# Patient Record
Sex: Female | Born: 1975 | Race: White | Hispanic: No | Marital: Married | Smoking: Former smoker
Health system: Southern US, Community
[De-identification: ages and names within clinical notes are randomized; demographics above are authoritative.]

## PROBLEM LIST (undated history)

## (undated) DIAGNOSIS — T7840XA Allergy, unspecified, initial encounter: Secondary | ICD-10-CM

## (undated) DIAGNOSIS — E669 Obesity, unspecified: Secondary | ICD-10-CM

## (undated) DIAGNOSIS — F419 Anxiety disorder, unspecified: Secondary | ICD-10-CM

## (undated) DIAGNOSIS — F329 Major depressive disorder, single episode, unspecified: Secondary | ICD-10-CM

## (undated) DIAGNOSIS — F32A Depression, unspecified: Secondary | ICD-10-CM

## (undated) DIAGNOSIS — K589 Irritable bowel syndrome without diarrhea: Secondary | ICD-10-CM

## (undated) DIAGNOSIS — M797 Fibromyalgia: Secondary | ICD-10-CM

## (undated) DIAGNOSIS — K219 Gastro-esophageal reflux disease without esophagitis: Secondary | ICD-10-CM

## (undated) DIAGNOSIS — E538 Deficiency of other specified B group vitamins: Secondary | ICD-10-CM

## (undated) HISTORY — DX: Fibromyalgia: M79.7

## (undated) HISTORY — PX: OTHER SURGICAL HISTORY: SHX169

## (undated) HISTORY — DX: Anxiety disorder, unspecified: F41.9

## (undated) HISTORY — DX: Deficiency of other specified B group vitamins: E53.8

## (undated) HISTORY — DX: Allergy, unspecified, initial encounter: T78.40XA

## (undated) HISTORY — DX: Irritable bowel syndrome without diarrhea: K58.9

## (undated) HISTORY — DX: Obesity, unspecified: E66.9

## (undated) HISTORY — DX: Major depressive disorder, single episode, unspecified: F32.9

## (undated) HISTORY — DX: Gastro-esophageal reflux disease without esophagitis: K21.9

## (undated) HISTORY — DX: Depression, unspecified: F32.A

---

## 1997-05-17 ENCOUNTER — Inpatient Hospital Stay (HOSPITAL_COMMUNITY): Admission: AD | Admit: 1997-05-17 | Discharge: 1997-05-17 | Payer: Self-pay | Admitting: *Deleted

## 1997-05-26 ENCOUNTER — Inpatient Hospital Stay (HOSPITAL_COMMUNITY): Admission: AD | Admit: 1997-05-26 | Discharge: 1997-05-26 | Payer: Self-pay | Admitting: *Deleted

## 1997-06-11 ENCOUNTER — Inpatient Hospital Stay (HOSPITAL_COMMUNITY): Admission: AD | Admit: 1997-06-11 | Discharge: 1997-06-13 | Payer: Self-pay | Admitting: Obstetrics

## 1998-02-22 ENCOUNTER — Inpatient Hospital Stay (HOSPITAL_COMMUNITY): Admission: AD | Admit: 1998-02-22 | Discharge: 1998-02-22 | Payer: Self-pay | Admitting: Obstetrics & Gynecology

## 1998-09-08 ENCOUNTER — Encounter: Payer: Self-pay | Admitting: Emergency Medicine

## 1998-09-08 ENCOUNTER — Emergency Department (HOSPITAL_COMMUNITY): Admission: EM | Admit: 1998-09-08 | Discharge: 1998-09-08 | Payer: Self-pay | Admitting: Emergency Medicine

## 2004-02-24 ENCOUNTER — Ambulatory Visit: Payer: Self-pay | Admitting: Internal Medicine

## 2004-06-08 ENCOUNTER — Ambulatory Visit: Payer: Self-pay | Admitting: Internal Medicine

## 2004-06-23 ENCOUNTER — Ambulatory Visit: Payer: Self-pay | Admitting: Internal Medicine

## 2004-06-30 ENCOUNTER — Ambulatory Visit: Payer: Self-pay | Admitting: Internal Medicine

## 2004-07-07 ENCOUNTER — Ambulatory Visit: Payer: Self-pay | Admitting: Internal Medicine

## 2004-07-14 ENCOUNTER — Ambulatory Visit: Payer: Self-pay | Admitting: Internal Medicine

## 2004-08-12 ENCOUNTER — Ambulatory Visit: Payer: Self-pay | Admitting: Internal Medicine

## 2004-09-12 ENCOUNTER — Ambulatory Visit: Payer: Self-pay | Admitting: Internal Medicine

## 2004-10-14 ENCOUNTER — Ambulatory Visit: Payer: Self-pay | Admitting: Internal Medicine

## 2004-11-11 ENCOUNTER — Ambulatory Visit: Payer: Self-pay | Admitting: Internal Medicine

## 2004-12-01 ENCOUNTER — Ambulatory Visit: Payer: Self-pay | Admitting: Internal Medicine

## 2004-12-08 ENCOUNTER — Emergency Department (HOSPITAL_COMMUNITY): Admission: EM | Admit: 2004-12-08 | Discharge: 2004-12-08 | Payer: Self-pay | Admitting: Emergency Medicine

## 2004-12-16 ENCOUNTER — Ambulatory Visit: Payer: Self-pay | Admitting: Internal Medicine

## 2004-12-28 ENCOUNTER — Ambulatory Visit: Payer: Self-pay | Admitting: Internal Medicine

## 2005-01-16 ENCOUNTER — Ambulatory Visit: Payer: Self-pay | Admitting: Internal Medicine

## 2005-02-13 ENCOUNTER — Ambulatory Visit: Payer: Self-pay | Admitting: Family Medicine

## 2005-03-16 ENCOUNTER — Ambulatory Visit: Payer: Self-pay | Admitting: Internal Medicine

## 2005-04-17 ENCOUNTER — Ambulatory Visit: Payer: Self-pay | Admitting: Internal Medicine

## 2005-04-20 ENCOUNTER — Other Ambulatory Visit: Admission: RE | Admit: 2005-04-20 | Discharge: 2005-04-20 | Payer: Self-pay | Admitting: Obstetrics and Gynecology

## 2005-05-16 ENCOUNTER — Ambulatory Visit: Payer: Self-pay | Admitting: Internal Medicine

## 2005-06-06 ENCOUNTER — Ambulatory Visit: Payer: Self-pay | Admitting: Internal Medicine

## 2005-06-15 ENCOUNTER — Ambulatory Visit: Payer: Self-pay | Admitting: Internal Medicine

## 2005-07-13 ENCOUNTER — Ambulatory Visit: Payer: Self-pay | Admitting: Internal Medicine

## 2005-07-27 ENCOUNTER — Ambulatory Visit: Payer: Self-pay | Admitting: Internal Medicine

## 2005-08-02 ENCOUNTER — Ambulatory Visit: Payer: Self-pay | Admitting: Internal Medicine

## 2005-08-15 ENCOUNTER — Ambulatory Visit: Payer: Self-pay | Admitting: Internal Medicine

## 2005-09-11 ENCOUNTER — Ambulatory Visit: Payer: Self-pay | Admitting: Internal Medicine

## 2005-10-04 ENCOUNTER — Ambulatory Visit: Payer: Self-pay | Admitting: Internal Medicine

## 2005-10-16 ENCOUNTER — Ambulatory Visit: Payer: Self-pay | Admitting: Internal Medicine

## 2005-11-14 ENCOUNTER — Ambulatory Visit: Payer: Self-pay | Admitting: Internal Medicine

## 2005-12-11 ENCOUNTER — Ambulatory Visit: Payer: Self-pay | Admitting: Internal Medicine

## 2005-12-19 ENCOUNTER — Ambulatory Visit: Payer: Self-pay | Admitting: Internal Medicine

## 2006-01-19 ENCOUNTER — Ambulatory Visit: Payer: Self-pay | Admitting: Internal Medicine

## 2006-02-16 ENCOUNTER — Ambulatory Visit: Payer: Self-pay | Admitting: Internal Medicine

## 2006-03-16 ENCOUNTER — Ambulatory Visit: Payer: Self-pay | Admitting: Internal Medicine

## 2006-04-10 ENCOUNTER — Ambulatory Visit: Payer: Self-pay | Admitting: Internal Medicine

## 2006-04-11 ENCOUNTER — Emergency Department (HOSPITAL_COMMUNITY): Admission: EM | Admit: 2006-04-11 | Discharge: 2006-04-11 | Payer: Self-pay | Admitting: Emergency Medicine

## 2006-04-13 ENCOUNTER — Ambulatory Visit: Payer: Self-pay | Admitting: Internal Medicine

## 2006-05-11 ENCOUNTER — Ambulatory Visit: Payer: Self-pay | Admitting: Internal Medicine

## 2006-06-11 ENCOUNTER — Ambulatory Visit: Payer: Self-pay | Admitting: Internal Medicine

## 2006-06-20 ENCOUNTER — Ambulatory Visit: Payer: Self-pay | Admitting: Family Medicine

## 2006-06-20 LAB — CONVERTED CEMR LAB
ALT: 14 units/L (ref 0–40)
Alkaline Phosphatase: 70 units/L (ref 39–117)
BUN: 12 mg/dL (ref 6–23)
Basophils Relative: 0.2 % (ref 0.0–1.0)
CO2: 28 meq/L (ref 19–32)
Creatinine, Ser: 0.7 mg/dL (ref 0.4–1.2)
Eosinophils Relative: 0.8 % (ref 0.0–5.0)
Glucose, Bld: 84 mg/dL (ref 70–99)
HCT: 43.7 % (ref 36.0–46.0)
Hemoglobin: 15 g/dL (ref 12.0–15.0)
Monocytes Absolute: 0.6 10*3/uL (ref 0.2–0.7)
Monocytes Relative: 7.8 % (ref 3.0–11.0)
Potassium: 4.1 meq/L (ref 3.5–5.1)
RDW: 12 % (ref 11.5–14.6)
TSH: 1.23 microintl units/mL (ref 0.35–5.50)
Total Bilirubin: 0.8 mg/dL (ref 0.3–1.2)
Total Protein: 7.1 g/dL (ref 6.0–8.3)
WBC: 7.5 10*3/uL (ref 4.5–10.5)

## 2006-07-13 ENCOUNTER — Ambulatory Visit: Payer: Self-pay | Admitting: Internal Medicine

## 2006-08-09 ENCOUNTER — Ambulatory Visit: Payer: Self-pay | Admitting: Internal Medicine

## 2006-09-06 ENCOUNTER — Ambulatory Visit: Payer: Self-pay | Admitting: Internal Medicine

## 2006-09-06 DIAGNOSIS — D531 Other megaloblastic anemias, not elsewhere classified: Secondary | ICD-10-CM | POA: Insufficient documentation

## 2006-09-06 DIAGNOSIS — D518 Other vitamin B12 deficiency anemias: Secondary | ICD-10-CM | POA: Insufficient documentation

## 2006-09-06 HISTORY — DX: Other vitamin B12 deficiency anemias: D51.8

## 2006-10-04 ENCOUNTER — Ambulatory Visit: Payer: Self-pay | Admitting: Internal Medicine

## 2006-11-05 ENCOUNTER — Ambulatory Visit: Payer: Self-pay | Admitting: Internal Medicine

## 2006-12-03 ENCOUNTER — Ambulatory Visit: Payer: Self-pay | Admitting: Internal Medicine

## 2007-01-01 ENCOUNTER — Ambulatory Visit: Payer: Self-pay | Admitting: Internal Medicine

## 2007-01-31 ENCOUNTER — Ambulatory Visit: Payer: Self-pay | Admitting: Internal Medicine

## 2007-02-05 ENCOUNTER — Inpatient Hospital Stay (HOSPITAL_COMMUNITY): Admission: AD | Admit: 2007-02-05 | Discharge: 2007-02-06 | Payer: Self-pay | Admitting: Obstetrics & Gynecology

## 2007-02-25 ENCOUNTER — Inpatient Hospital Stay (HOSPITAL_COMMUNITY): Admission: AD | Admit: 2007-02-25 | Discharge: 2007-02-28 | Payer: Self-pay | Admitting: Obstetrics and Gynecology

## 2007-03-05 ENCOUNTER — Ambulatory Visit: Payer: Self-pay | Admitting: Internal Medicine

## 2007-04-10 ENCOUNTER — Ambulatory Visit: Payer: Self-pay | Admitting: Internal Medicine

## 2007-05-09 ENCOUNTER — Ambulatory Visit: Payer: Self-pay | Admitting: Internal Medicine

## 2007-06-07 ENCOUNTER — Ambulatory Visit: Payer: Self-pay | Admitting: Internal Medicine

## 2007-07-08 ENCOUNTER — Ambulatory Visit: Payer: Self-pay | Admitting: Internal Medicine

## 2007-08-06 ENCOUNTER — Ambulatory Visit: Payer: Self-pay | Admitting: Internal Medicine

## 2007-08-22 ENCOUNTER — Encounter: Admission: RE | Admit: 2007-08-22 | Discharge: 2007-08-22 | Payer: Self-pay | Admitting: Internal Medicine

## 2007-08-22 ENCOUNTER — Ambulatory Visit: Payer: Self-pay | Admitting: Internal Medicine

## 2007-08-22 DIAGNOSIS — R109 Unspecified abdominal pain: Secondary | ICD-10-CM | POA: Insufficient documentation

## 2007-08-22 DIAGNOSIS — R3129 Other microscopic hematuria: Secondary | ICD-10-CM | POA: Insufficient documentation

## 2007-08-22 DIAGNOSIS — F411 Generalized anxiety disorder: Secondary | ICD-10-CM | POA: Insufficient documentation

## 2007-08-22 LAB — CONVERTED CEMR LAB
Ketones, urine, test strip: NEGATIVE
Nitrite: NEGATIVE
Urobilinogen, UA: 0.2

## 2007-08-23 ENCOUNTER — Telehealth (INDEPENDENT_AMBULATORY_CARE_PROVIDER_SITE_OTHER): Payer: Self-pay | Admitting: *Deleted

## 2007-09-11 ENCOUNTER — Ambulatory Visit: Payer: Self-pay | Admitting: Internal Medicine

## 2007-10-08 ENCOUNTER — Ambulatory Visit: Payer: Self-pay | Admitting: Internal Medicine

## 2007-11-05 ENCOUNTER — Ambulatory Visit: Payer: Self-pay | Admitting: Internal Medicine

## 2007-12-10 ENCOUNTER — Ambulatory Visit: Payer: Self-pay | Admitting: Internal Medicine

## 2007-12-10 DIAGNOSIS — G43909 Migraine, unspecified, not intractable, without status migrainosus: Secondary | ICD-10-CM | POA: Insufficient documentation

## 2007-12-10 DIAGNOSIS — IMO0001 Reserved for inherently not codable concepts without codable children: Secondary | ICD-10-CM | POA: Insufficient documentation

## 2008-01-09 ENCOUNTER — Ambulatory Visit: Payer: Self-pay | Admitting: Internal Medicine

## 2008-02-10 ENCOUNTER — Ambulatory Visit: Payer: Self-pay | Admitting: Internal Medicine

## 2008-02-26 ENCOUNTER — Ambulatory Visit: Payer: Self-pay | Admitting: Family Medicine

## 2008-02-26 DIAGNOSIS — M940 Chondrocostal junction syndrome [Tietze]: Secondary | ICD-10-CM | POA: Insufficient documentation

## 2008-03-12 ENCOUNTER — Ambulatory Visit: Payer: Self-pay | Admitting: Family Medicine

## 2008-03-12 ENCOUNTER — Telehealth (INDEPENDENT_AMBULATORY_CARE_PROVIDER_SITE_OTHER): Payer: Self-pay | Admitting: *Deleted

## 2008-04-09 ENCOUNTER — Ambulatory Visit: Payer: Self-pay | Admitting: Internal Medicine

## 2008-05-08 ENCOUNTER — Ambulatory Visit: Payer: Self-pay | Admitting: Internal Medicine

## 2008-06-05 ENCOUNTER — Ambulatory Visit: Payer: Self-pay | Admitting: Internal Medicine

## 2008-07-10 ENCOUNTER — Ambulatory Visit: Payer: Self-pay | Admitting: Internal Medicine

## 2008-08-06 ENCOUNTER — Ambulatory Visit: Payer: Self-pay | Admitting: Internal Medicine

## 2008-09-08 ENCOUNTER — Ambulatory Visit: Payer: Self-pay | Admitting: Internal Medicine

## 2008-10-08 ENCOUNTER — Ambulatory Visit: Payer: Self-pay | Admitting: Internal Medicine

## 2008-10-12 ENCOUNTER — Ambulatory Visit: Payer: Self-pay | Admitting: Family Medicine

## 2008-11-06 ENCOUNTER — Ambulatory Visit: Payer: Self-pay | Admitting: Internal Medicine

## 2008-12-08 ENCOUNTER — Ambulatory Visit: Payer: Self-pay | Admitting: Internal Medicine

## 2009-01-05 ENCOUNTER — Ambulatory Visit: Payer: Self-pay | Admitting: Family Medicine

## 2009-01-05 ENCOUNTER — Ambulatory Visit: Payer: Self-pay | Admitting: Diagnostic Radiology

## 2009-01-05 ENCOUNTER — Telehealth (INDEPENDENT_AMBULATORY_CARE_PROVIDER_SITE_OTHER): Payer: Self-pay | Admitting: *Deleted

## 2009-01-05 ENCOUNTER — Ambulatory Visit (HOSPITAL_BASED_OUTPATIENT_CLINIC_OR_DEPARTMENT_OTHER): Admission: RE | Admit: 2009-01-05 | Discharge: 2009-01-05 | Payer: Self-pay | Admitting: Family Medicine

## 2009-01-05 DIAGNOSIS — R079 Chest pain, unspecified: Secondary | ICD-10-CM | POA: Insufficient documentation

## 2009-01-06 ENCOUNTER — Telehealth (INDEPENDENT_AMBULATORY_CARE_PROVIDER_SITE_OTHER): Payer: Self-pay | Admitting: *Deleted

## 2009-02-09 ENCOUNTER — Ambulatory Visit: Payer: Self-pay | Admitting: Internal Medicine

## 2009-03-09 ENCOUNTER — Ambulatory Visit: Payer: Self-pay | Admitting: Internal Medicine

## 2009-04-09 ENCOUNTER — Ambulatory Visit: Payer: Self-pay | Admitting: Internal Medicine

## 2009-05-07 ENCOUNTER — Ambulatory Visit: Payer: Self-pay | Admitting: Internal Medicine

## 2009-06-08 ENCOUNTER — Ambulatory Visit: Payer: Self-pay | Admitting: Internal Medicine

## 2009-07-12 ENCOUNTER — Ambulatory Visit: Payer: Self-pay | Admitting: Internal Medicine

## 2009-07-29 ENCOUNTER — Ambulatory Visit: Payer: Self-pay | Admitting: Internal Medicine

## 2009-07-29 DIAGNOSIS — R110 Nausea: Secondary | ICD-10-CM | POA: Insufficient documentation

## 2009-07-29 DIAGNOSIS — R51 Headache: Secondary | ICD-10-CM

## 2009-07-29 DIAGNOSIS — H6990 Unspecified Eustachian tube disorder, unspecified ear: Secondary | ICD-10-CM | POA: Insufficient documentation

## 2009-07-29 DIAGNOSIS — R519 Headache, unspecified: Secondary | ICD-10-CM | POA: Insufficient documentation

## 2009-07-29 DIAGNOSIS — I951 Orthostatic hypotension: Secondary | ICD-10-CM | POA: Insufficient documentation

## 2009-07-29 DIAGNOSIS — H6980 Other specified disorders of Eustachian tube, unspecified ear: Secondary | ICD-10-CM | POA: Insufficient documentation

## 2009-07-30 LAB — CONVERTED CEMR LAB
Basophils Absolute: 0 10*3/uL (ref 0.0–0.1)
Calcium: 9.8 mg/dL (ref 8.4–10.5)
GFR calc non Af Amer: 91.29 mL/min (ref >60)
Glucose, Bld: 84 mg/dL (ref 70–99)
HCT: 41.7 % (ref 36.0–46.0)
Lymphs Abs: 2.6 10*3/uL (ref 0.7–4.0)
MCHC: 33.7 g/dL (ref 30.0–36.0)
MCV: 89.7 fL (ref 78.0–100.0)
Monocytes Absolute: 0.5 10*3/uL (ref 0.1–1.0)
Platelets: 298 10*3/uL (ref 150.0–400.0)
RDW: 13 % (ref 11.5–14.6)
Sodium: 141 meq/L (ref 135–145)

## 2009-09-10 ENCOUNTER — Ambulatory Visit: Payer: Self-pay | Admitting: Internal Medicine

## 2009-09-23 ENCOUNTER — Ambulatory Visit: Payer: Self-pay | Admitting: Internal Medicine

## 2009-10-07 ENCOUNTER — Ambulatory Visit: Payer: Self-pay | Admitting: Internal Medicine

## 2009-11-09 ENCOUNTER — Ambulatory Visit: Payer: Self-pay | Admitting: Internal Medicine

## 2009-12-08 ENCOUNTER — Ambulatory Visit: Payer: Self-pay | Admitting: Internal Medicine

## 2010-01-12 ENCOUNTER — Ambulatory Visit: Payer: Self-pay | Admitting: Internal Medicine

## 2010-02-14 ENCOUNTER — Ambulatory Visit: Payer: Self-pay | Admitting: Internal Medicine

## 2010-03-11 ENCOUNTER — Ambulatory Visit
Admission: RE | Admit: 2010-03-11 | Discharge: 2010-03-11 | Payer: Self-pay | Source: Home / Self Care | Attending: Internal Medicine | Admitting: Internal Medicine

## 2010-03-14 DIAGNOSIS — D518 Other vitamin B12 deficiency anemias: Secondary | ICD-10-CM

## 2010-04-12 NOTE — Assessment & Plan Note (Signed)
Summary: b-12 shot///sph  Nurse Visit  CC: 12 inj./kb   Allergies: 1)  ! Avelox 2)  ! Pcn 3)  ! Sumatriptan Succinate (Sumatriptan Succinate)  Medication Administration  Injection # 1:    Medication: Vit B12 1000 mcg    Diagnosis: ANEMIA, VITAMIN B12 DEFICIENCY NEC (ICD-281.1)    Route: IM    Site: L deltoid    Exp Date: 05/12/2011    Lot #: [id number]    Mfr: American Regent    Patient tolerated injection without complications    Given by: Lucious Groves CMA (January 12, 2010 11:24 AM)  Orders Added: 1)  Vit B12 1000 mcg [J3420] 2)  Admin of Therapeutic Inj  intramuscular or subcutaneous [16109]

## 2010-04-12 NOTE — Assessment & Plan Note (Signed)
Summary: B-12 shot/kdc  Nurse Visit   Allergies: 1)  ! Avelox 2)  ! Pcn  Medication Administration  Injection # 1:    Medication: Vit B12 1000 mcg    Diagnosis: ANEMIA, VITAMIN B12 DEFICIENCY NEC (ICD-281.1)    Route: IM    Site: R deltoid    Exp Date: 01/12/2011    Lot #: [id number]    Mfr: American Regent    Given by: Doristine Devoid (May 07, 2009 12:10 PM)  Orders Added: 1)  Vit B12 1000 mcg [J3420] 2)  Admin of Therapeutic Inj  intramuscular or subcutaneous [96372]   Medication Administration  Injection # 1:    Medication: Vit B12 1000 mcg    Diagnosis: ANEMIA, VITAMIN B12 DEFICIENCY NEC (ICD-281.1)    Route: IM    Site: R deltoid    Exp Date: 01/12/2011    Lot #: [id number]    Mfr: American Regent    Given by: Doristine Devoid (May 07, 2009 12:10 PM)  Orders Added: 1)  Vit B12 1000 mcg [J3420] 2)  Admin of Therapeutic Inj  intramuscular or subcutaneous [[id number]]

## 2010-04-12 NOTE — Assessment & Plan Note (Signed)
Summary: B12 INJ/RH.............  Nurse Visit   Allergies: 1)  ! Avelox 2)  ! Pcn 3)  ! Sumatriptan Succinate (Sumatriptan Succinate)  Medication Administration  Injection # 1:    Medication: Vit B12 1000 mcg    Diagnosis: ANEMIA, VITAMIN B12 DEFICIENCY NEC (ICD-281.1)    Route: IM    Site: L deltoid    Exp Date: 05/2011    Lot #: [id number]    Mfr: American Regent    Patient tolerated injection without complications    Given by: Shonna Chock CMA (December 08, 2009 11:47 AM)  Orders Added: 1)  Vit B12 1000 mcg [J3420] 2)  Admin of Therapeutic Inj  intramuscular or subcutaneous [16109]

## 2010-04-12 NOTE — Assessment & Plan Note (Signed)
Summary: b12 inj//pt will be here at 11:15//lch  Nurse Visit   Allergies: 1)  ! Avelox 2)  ! Pcn  Medication Administration  Injection # 1:    Medication: Vit B12 1000 mcg    Diagnosis: ANEMIA, VITAMIN B12 DEFICIENCY NEC (ICD-281.1)    Route: IM    Site: R deltoid    Exp Date: 04/2011    Lot #: [id number]    Mfr: American Regent    Patient tolerated injection without complications    Given by: Shonna Chock (Jul 12, 2009 11:23 AM)  Orders Added: 1)  Vit B12 1000 mcg [J3420] 2)  Admin of Therapeutic Inj  intramuscular or subcutaneous [[id number]]

## 2010-04-12 NOTE — Assessment & Plan Note (Signed)
Summary: HEADACHES--HAS BEEN KEEPING TRACK///SPH   Vital Signs:  Patient profile:   35 year old female Height:      64 inches (162.56 cm) Weight:      159.50 pounds (72.50 kg) BMI:     27.48 Temp:     98.4 degrees F (36.89 degrees C) oral Resp:     14 per minute BP sitting:   110 / 76  (left arm) Cuff size:   regular  Vitals Entered By: Lucious Groves CMA (September 23, 2009 12:26 PM) CC: C/O ongoing headache issues almost daily./kb Comments Patient notes that she does not experience fever or vomiting, but does get nausea. Patient also notes that she has seen an eye specialist./kb   Primary Care Provider:  Alwyn Ren  CC:  C/O ongoing headache issues almost daily./kb.  History of Present Illness: 07/29/2009 OV reviewed ; no change EXCEPT headaches now daily & no ear pressure, otherwise exactly same symptoms. They last 1-3 hrs ; usually present upon awakening.She denies excess caffeine or NSAIDS. Ophth exam was WNL since last visit.PMH of migraines; intolerant to triptans.Lupper face ;variable dull, sharp or throbbing.Rx: none  Current Medications (verified): 1)  Sertraline Hcl 50 Mg  Tabs (Sertraline Hcl) .Marland Kitchen.. 1 By Mouth Qd 2)  Gabapentin 100 Mg Caps (Gabapentin) .Marland Kitchen.. 1 Every 8 Hrs As Needed For Headache  Allergies (verified): 1)  ! Avelox 2)  ! Pcn 3)  ! Sumatriptan Succinate (Sumatriptan Succinate)  Review of Systems ENT:  Denies earache, nasal congestion, and sinus pressure; No purulence.  Physical Exam  General:  well-nourished,in no acute distress; alert,appropriate and cooperative throughout examination Eyes:  No corneal or conjunctival inflammation noted. EOMI. Perrla. Field of  Vision grossly normal. Mouth:  No tongue deviation Neurologic:  alert & oriented X3, cranial nerves II-XII intact, strength normal in all extremities, sensation intact to light touch, gait normal, DTRs symmetrical and normal, and Romberg negative.     Impression & Recommendations:  Problem # 1:   HEADACHE (ICD-784.0) Chronic Daily Headache Migraine Variant  Complete Medication List: 1)  Sertraline Hcl 50 Mg Tabs (Sertraline hcl) .Marland Kitchen.. 1 by mouth qd 2)  Gabapentin 100 Mg Caps (Gabapentin) .Marland Kitchen.. 1-3  every 8 hrs as needed for headache 3)  Topiramate 25 Mg Tabs (Topiramate) .Marland Kitchen.. 1 at bedtime x 7 days then 1 two times a day x 7 days. can increase to 2 two times a day if needed  Patient Instructions: 1)  Titrate generic Topamax as instructed. Prescriptions: TOPIRAMATE 25 MG TABS (TOPIRAMATE) 1 at bedtime X 7 days then 1 two times a day X 7 days. Can increase to 2 two times a day if needed  #60 x 0   Entered and Authorized by:   Marga Melnick MD   Signed by:   Marga Melnick MD on 09/23/2009   Method used:   Print then Give to Patient   RxID:   [id number] GABAPENTIN 100 MG CAPS (GABAPENTIN) 1-3  every 8 hrs as needed for headache  #60 x 1   Entered and Authorized by:   Marga Melnick MD   Signed by:   Marga Melnick MD on 09/23/2009   Method used:   Electronically to        [facility] [id number] (retail)       [address]       [place], [place]  [address]       Ph: [phone] or  [phone]       Fax: [phone]   RxID:   [id number]

## 2010-04-12 NOTE — Assessment & Plan Note (Signed)
Summary: B12/KN  Nurse Visit   Allergies: 1)  ! Avelox 2)  ! Pcn 3)  ! Sumatriptan Succinate (Sumatriptan Succinate)  Medication Administration  Injection # 1:    Medication: Vit B12 1000 mcg    Diagnosis: ANEMIA, VITAMIN B12 DEFICIENCY NEC (ICD-281.1)    Route: IM    Site: R deltoid    Exp Date: 05/2011    Lot #: [id number]    Mfr: American Regent    Patient tolerated injection without complications    Given by: Shonna Chock CMA (November 09, 2009 3:00 PM)  Orders Added: 1)  Vit B12 1000 mcg [J3420] 2)  Admin of Therapeutic Inj  intramuscular or subcutaneous [96372]  Appended Document: B12/KN

## 2010-04-12 NOTE — Assessment & Plan Note (Signed)
Summary: B12//KN  Nurse Visit   Allergies: 1)  ! Avelox 2)  ! Pcn  Medication Administration  Injection # 1:    Medication: Vit B12 1000 mcg    Diagnosis: ANEMIA, VITAMIN B12 DEFICIENCY NEC (ICD-281.1)    Route: IM    Site: R deltoid    Exp Date: 05/2011    Lot #: [id number]    Mfr: American Regent    Patient tolerated injection without complications    Given by: Army Fossa CMA (September 10, 2009 10:36 AM)  Orders Added: 1)  Admin of Therapeutic Inj  intramuscular or subcutaneous [96372] 2)  Vit B12 1000 mcg [J3420]

## 2010-04-12 NOTE — Assessment & Plan Note (Signed)
Summary: b12 shot/kdc  Nurse Visit   Allergies: 1)  ! Avelox 2)  ! Pcn  Medication Administration  Injection # 1:    Medication: Vit B12 1000 mcg    Diagnosis: ANEMIA, VITAMIN B12 DEFICIENCY NEC (ICD-281.1)    Route: IM    Site: L deltoid    Exp Date: 12/2010    Lot #: [id number]    Mfr: American Regent    Patient tolerated injection without complications    Given by: Floydene Flock (April 09, 2009 1:31 PM)  Orders Added: 1)  Admin of Therapeutic Inj  intramuscular or subcutaneous [96372] 2)  Vit B12 1000 mcg [J3420]   Medication Administration  Injection # 1:    Medication: Vit B12 1000 mcg    Diagnosis: ANEMIA, VITAMIN B12 DEFICIENCY NEC (ICD-281.1)    Route: IM    Site: L deltoid    Exp Date: 12/2010    Lot #: [id number]    Mfr: American Regent    Patient tolerated injection without complications    Given by: Floydene Flock (April 09, 2009 1:31 PM)  Orders Added: 1)  Admin of Therapeutic Inj  intramuscular or subcutaneous [96372] 2)  Vit B12 1000 mcg [J3420]

## 2010-04-12 NOTE — Assessment & Plan Note (Signed)
Summary: B12 INJ/RH...........  Nurse Visit   Allergies: 1)  ! Avelox 2)  ! Pcn  Medication Administration  Injection # 1:    Medication: Vit B12 1000 mcg    Diagnosis: ANEMIA, VITAMIN B12 DEFICIENCY NEC (ICD-281.1)    Route: IM    Site: R deltoid    Exp Date: 01/12/2011    Lot #: [id number]    Mfr: American Regent    Given by: Kandice Hams (June 08, 2009 11:52 AM)  Orders Added: 1)  Vit B12 1000 mcg [J3420] 2)  Admin of Therapeutic Inj  intramuscular or subcutaneous [[id number]]

## 2010-04-12 NOTE — Assessment & Plan Note (Signed)
Summary: b-12/cbs  Nurse Visit   Allergies: 1)  ! Avelox 2)  ! Pcn 3)  ! Sumatriptan Succinate (Sumatriptan Succinate)  Medication Administration  Injection # 1:    Medication: Vit B12 1000 mcg    Diagnosis: ANEMIA, VITAMIN B12 DEFICIENCY NEC (ICD-281.1)    Route: IM    Site: L deltoid    Exp Date: 05/2011    Lot #: [id number]    Mfr: American Regent    Patient tolerated injection without complications    Given by: Shonna Chock CMA (October 07, 2009 10:54 AM)  Orders Added: 1)  Vit B12 1000 mcg [J3420] 2)  Admin of Therapeutic Inj  intramuscular or subcutaneous [[id number]]

## 2010-04-12 NOTE — Assessment & Plan Note (Signed)
Summary: headache- dizzy- pressure in face/cbs   Vital Signs:  Patient profile:   35 year old female Weight:      162.0 pounds Temp:     98.7 degrees F oral Pulse rate:   70 / minute Resp:     16 per minute BP supine:   116 / 70  (left arm) BP sitting:   114 / 72  (left arm) BP standing:   116 / 72  (left arm) Cuff size:   large  Vitals Entered By: Shonna Chock (Jul 29, 2009 10:53 AM) CC: Headache, dizzy, ears feel full, pressure in face x 2 days, Headaches Comments REVIEWED MED LIST, PATIENT AGREED DOSE AND INSTRUCTION CORRECT  Pulse Supine: 66, Pulse Standing 80   Primary Care Provider:  Alwyn Ren  CC:  Headache, dizzy, ears feel full, pressure in face x 2 days, and Headaches.  History of Present Illness:  Headaches      This is a 35 year old woman who presents with Headaches since 07/26/2009 w/o trigger.  The patient reports nausea, sinus pain, and photophobia, but denies vomiting, sweats, tearing of eyes, nasal congestion, sinus pressure, and phonophobia.  The headache is described as constant,dull theadaches intermittently.  High-risk features (red flags) include vision  change  of blurring and new type of headache, the frontal headaches.  The patient denies the following high-risk features: fever, neck pain/stiffness, focal weakness, altered mental status, rash, trauma, and pain worse with exertion.  The headaches are precipitated by strenous activity.  Prior treatment has included a NSAID with "dulling" of pain. NSAIDS 2 every 4-6 hrs for 3 days. Caffeine as soda, 1 once daily .  intolerant to tryptans, nausea & lightheaded, palpitations  Allergies: 1)  ! Avelox 2)  ! Pcn  Review of Systems General:  Denies chills and sweats. Eyes:  Denies blurring, double vision, and vision loss-both eyes. ENT:  Complains of earache; denies decreased hearing, ear discharge, and ringing in ears; Pressure L ear. No purulence.No BPV. CV:  Complains of lightheadness and near fainting; denies  chest pain or discomfort and palpitations; Symptoms positionally ,ie standing suddenly. GI:  Denies bloody stools, dark tarry stools, and diarrhea. Neuro:  Denies brief paralysis, numbness, sensation of room spinning, tingling, and weakness.  Physical Exam  General:  in no acute distress; appropriate and cooperative throughout examination; appears mildly uncomfortable-appearing.   Head:  Normocephalic and atraumatic without obvious abnormalities. No apparent alopecia or balding. Eyes:  No corneal or conjunctival inflammation noted. EOMI. Perrla. Funduscopic exam benign, without hemorrhages, exudates or papilledema. Vision  & FOV grossly normal.Minimal unsustained nystagmus  with lm lat gaze Ears:  External ear exam shows no significant lesions or deformities.  Otoscopic examination reveals clear canals, tympanic membranes are intact bilaterally without bulging, retraction, inflammation or discharge. Hearing is grossly normal bilaterally. Whispe heard @ 6 ft bilaterally Mouth:  Oral mucosa and oropharynx without lesions or exudates.  Teeth in good repair. Neck:  No deformities, masses, or tenderness noted. Heart:  Normal rate and regular rhythm. S1 and S2 normal without gallop, murmur, click, rub or other extra sounds. Pulses:  R and L carotid,radial  pulses are full and equal bilaterally Extremities:  No clubbing, cyanosis, edema, or deformity noted with normal full range of motion of all joints.   Neg SLR Neurologic:  alert & oriented X3, cranial nerves II-XII intact, strength normal in all extremities, sensation intact to light touch, gait normal, DTRs symmetrical and normal, finger-to-nose normal, heel-to-shin normal, toes down  bilaterally on Babinski, and Romberg negative.   Skin:  Intact without suspicious lesions or rashes Cervical Nodes:  No lymphadenopathy noted Axillary Nodes:  No palpable lymphadenopathy Psych:  memory intact for recent and remote, normally interactive, good eye contact,  and not anxious appearing.     Impression & Recommendations:  Problem # 1:  HEADACHE (ICD-784.0)  The following medications were removed from the medication list:    Vicodin Es 7.5-750 Mg Tabs (Hydrocodone-acetaminophen) .Marland Kitchen... 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 1 by mouth qd 2)  Gabapentin 100 Mg Caps (Gabapentin) .Marland Kitchen.. 1 every 8 hrs as needed for headache  Patient Instructions: 1)  Interventions for Eustachian Tube Dysfunction as discussed. sometrics pre standing . Prescriptions: GABAPENTIN 100 MG CAPS (GABAPENTIN) 1 every 8 hrs as needed for headache  #30 x 0   Entered and Authorized by:   Marga Melnick MD   Signed by:   Marga Melnick MD on 07/29/2009   Method used:   Faxed to ...       [facility] [address] (retail)       [address]       [place], [place]  [address]       Ph: [phone] or [phone]       Fax: [phone]   RxID:   [id number]   Appended Document: headache- dizzy- pressure in face/cbs   Medication Administration  Injection # 1:    Medication: Vit B12 1000 mcg    Diagnosis: ANEMIA, VITAMIN B12 DEFICIENCY NEC (ICD-281.1)    Route: IM    Site: R deltoid    Exp Date: 04/13    Lot #: [id number]    Mfr: American Regent    Patient tolerated injection without complications    Given by: Shonna Chock (Jul 29, 2009 12:24 PM)  Orders Added: 1)  Vit B12 1000 mcg [J3420] 2)  Admin of Therapeutic Inj  intramuscular or subcutaneous [56387]

## 2010-04-12 NOTE — Assessment & Plan Note (Signed)
Summary: FOR B-12//PH  Nurse Visit  CC: B-12 inj./kb   Allergies: 1)  ! Avelox 2)  ! Pcn 3)  ! Sumatriptan Succinate (Sumatriptan Succinate)  Medication Administration  Injection # 1:    Medication: Vit B12 1000 mcg    Diagnosis: ANEMIA, VITAMIN B12 DEFICIENCY NEC (ICD-281.1)    Route: IM    Site: L deltoid    Exp Date: 05/12/2011    Lot #: [id number]    Mfr: American Regent    Patient tolerated injection without complications    Given by: Lucious Groves CMA (February 14, 2010 2:23 PM)  Orders Added: 1)  Vit B12 1000 mcg [J3420] 2)  Admin of Therapeutic Inj  intramuscular or subcutaneous [[id number]]

## 2010-04-14 ENCOUNTER — Encounter: Payer: Self-pay | Admitting: Internal Medicine

## 2010-04-14 ENCOUNTER — Ambulatory Visit (INDEPENDENT_AMBULATORY_CARE_PROVIDER_SITE_OTHER): Payer: BC Managed Care – PPO

## 2010-04-14 DIAGNOSIS — D518 Other vitamin B12 deficiency anemias: Secondary | ICD-10-CM

## 2010-04-14 NOTE — Assessment & Plan Note (Signed)
Summary: B-12//PH  Nurse Visit   Allergies: 1)  ! Avelox 2)  ! Pcn 3)  ! Sumatriptan Succinate (Sumatriptan Succinate)  Medication Administration  Injection # 1:    Medication: Vit B12 1000 mcg    Diagnosis: ANEMIA, VITAMIN B12 DEFICIENCY NEC (ICD-281.1)    Route: IM    Site: R deltoid    Exp Date: 05/12/2011    Lot #: [id number]    Mfr: American Regent    Patient tolerated injection without complications    Given by: Jeremy Johann CMA (March 11, 2010 10:08 AM)  Orders Added: 1)  Admin of Therapeutic Inj  intramuscular or subcutaneous [96372] 2)  Vit B12 1000 mcg [J3420]

## 2010-04-20 ENCOUNTER — Other Ambulatory Visit: Payer: Self-pay | Admitting: Obstetrics and Gynecology

## 2010-04-20 DIAGNOSIS — N644 Mastodynia: Secondary | ICD-10-CM

## 2010-04-20 NOTE — Assessment & Plan Note (Signed)
Summary: B12 Injection/scm  Nurse Visit   Allergies: 1)  ! Avelox 2)  ! Pcn 3)  ! Sumatriptan Succinate (Sumatriptan Succinate)  Medication Administration  Injection # 1:    Medication: Vit B12 1000 mcg    Diagnosis: ANEMIA, VITAMIN B12 DEFICIENCY NEC (ICD-281.1)    Route: IM    Site: R deltoid    Exp Date: 05/2011    Lot #: [id number]    Mfr: American Regent    Patient tolerated injection without complications    Given by: Shonna Chock CMA (April 14, 2010 3:03 PM)  Orders Added: 1)  Vit B12 1000 mcg [J3420] 2)  Admin of Therapeutic Inj  intramuscular or subcutaneous [96372]  Patient aware at next b12 injection she is to have b12 level checked 1st, then injection to be given./[facility] CMA  April 14, 2010 3:04 PM

## 2010-04-26 ENCOUNTER — Other Ambulatory Visit: Payer: BC Managed Care – PPO

## 2010-04-28 ENCOUNTER — Ambulatory Visit
Admission: RE | Admit: 2010-04-28 | Discharge: 2010-04-28 | Disposition: A | Discharge status: Home / Self Care | Payer: BC Managed Care – PPO | Source: Ambulatory Visit | Attending: Obstetrics and Gynecology | Admitting: Obstetrics and Gynecology

## 2010-04-28 DIAGNOSIS — N644 Mastodynia: Secondary | ICD-10-CM

## 2010-05-13 ENCOUNTER — Other Ambulatory Visit: Payer: Self-pay | Admitting: Internal Medicine

## 2010-05-13 ENCOUNTER — Encounter: Payer: Self-pay | Admitting: Internal Medicine

## 2010-05-13 ENCOUNTER — Ambulatory Visit: Payer: BC Managed Care – PPO

## 2010-05-13 ENCOUNTER — Ambulatory Visit (INDEPENDENT_AMBULATORY_CARE_PROVIDER_SITE_OTHER): Payer: BC Managed Care – PPO

## 2010-05-13 DIAGNOSIS — D518 Other vitamin B12 deficiency anemias: Secondary | ICD-10-CM

## 2010-05-19 NOTE — Assessment & Plan Note (Signed)
Summary: b12/cbs  Nurse Visit   Allergies: 1)  ! Avelox 2)  ! Pcn 3)  ! Sumatriptan Succinate (Sumatriptan Succinate)  Medication Administration  Injection # 1:    Medication: Vit B12 1000 mcg    Diagnosis: ANEMIA, VITAMIN B12 DEFICIENCY NEC (ICD-281.1)    Route: IM    Site: L deltoid    Exp Date: 05/2011    Lot #: [id number]    Mfr: American Regent    Patient tolerated injection without complications    Given by: Shonna Chock CMA (May 13, 2010 2:07 PM)  Orders Added: 1)  TLB-B12, Serum-Total ONLY [82607-B12] 2)  Vit B12 1000 mcg [J3420] 3)  Admin of Therapeutic Inj  intramuscular or subcutaneous [34742]

## 2010-05-24 ENCOUNTER — Encounter: Payer: Self-pay | Admitting: Internal Medicine

## 2010-06-10 ENCOUNTER — Ambulatory Visit (INDEPENDENT_AMBULATORY_CARE_PROVIDER_SITE_OTHER): Payer: BC Managed Care – PPO | Admitting: *Deleted

## 2010-06-10 DIAGNOSIS — E538 Deficiency of other specified B group vitamins: Secondary | ICD-10-CM

## 2010-06-10 MED ORDER — CYANOCOBALAMIN 1000 MCG/ML IJ SOLN
1000.0000 ug | Freq: Once | INTRAMUSCULAR | Status: AC
  Administered 2010-06-10: 1000 ug via INTRAMUSCULAR

## 2010-07-11 ENCOUNTER — Ambulatory Visit (INDEPENDENT_AMBULATORY_CARE_PROVIDER_SITE_OTHER): Payer: BC Managed Care – PPO | Admitting: *Deleted

## 2010-07-11 DIAGNOSIS — E538 Deficiency of other specified B group vitamins: Secondary | ICD-10-CM

## 2010-07-11 MED ORDER — CYANOCOBALAMIN 1000 MCG/ML IJ SOLN
1000.0000 ug | Freq: Once | INTRAMUSCULAR | Status: AC
  Administered 2010-07-11: 1000 ug via INTRAMUSCULAR

## 2010-08-10 ENCOUNTER — Ambulatory Visit (INDEPENDENT_AMBULATORY_CARE_PROVIDER_SITE_OTHER): Payer: BC Managed Care – PPO | Admitting: *Deleted

## 2010-08-10 DIAGNOSIS — E538 Deficiency of other specified B group vitamins: Secondary | ICD-10-CM

## 2010-08-10 MED ORDER — CYANOCOBALAMIN 1000 MCG/ML IJ SOLN
1000.0000 ug | Freq: Once | INTRAMUSCULAR | Status: AC
  Administered 2010-08-10: 1000 ug via INTRAMUSCULAR

## 2010-09-08 ENCOUNTER — Ambulatory Visit (INDEPENDENT_AMBULATORY_CARE_PROVIDER_SITE_OTHER): Payer: BC Managed Care – PPO | Admitting: *Deleted

## 2010-09-08 DIAGNOSIS — E538 Deficiency of other specified B group vitamins: Secondary | ICD-10-CM

## 2010-09-08 MED ORDER — CYANOCOBALAMIN 1000 MCG/ML IJ SOLN
1000.0000 ug | Freq: Once | INTRAMUSCULAR | Status: AC
  Administered 2010-09-08: 1000 ug via INTRAMUSCULAR

## 2010-10-11 ENCOUNTER — Ambulatory Visit (INDEPENDENT_AMBULATORY_CARE_PROVIDER_SITE_OTHER): Payer: BC Managed Care – PPO

## 2010-10-11 DIAGNOSIS — D518 Other vitamin B12 deficiency anemias: Secondary | ICD-10-CM

## 2010-10-11 DIAGNOSIS — D519 Vitamin B12 deficiency anemia, unspecified: Secondary | ICD-10-CM

## 2010-10-11 MED ORDER — CYANOCOBALAMIN 1000 MCG/ML IJ SOLN
1000.0000 ug | Freq: Once | INTRAMUSCULAR | Status: AC
  Administered 2010-10-11: 1000 ug via INTRAMUSCULAR

## 2010-11-09 ENCOUNTER — Ambulatory Visit (INDEPENDENT_AMBULATORY_CARE_PROVIDER_SITE_OTHER): Payer: BC Managed Care – PPO

## 2010-11-09 DIAGNOSIS — D518 Other vitamin B12 deficiency anemias: Secondary | ICD-10-CM

## 2010-11-09 MED ORDER — CYANOCOBALAMIN 1000 MCG/ML IJ SOLN
1000.0000 ug | Freq: Once | INTRAMUSCULAR | Status: AC
  Administered 2010-11-09: 1000 ug via INTRAMUSCULAR

## 2010-12-09 ENCOUNTER — Ambulatory Visit (INDEPENDENT_AMBULATORY_CARE_PROVIDER_SITE_OTHER): Payer: BC Managed Care – PPO

## 2010-12-09 DIAGNOSIS — D519 Vitamin B12 deficiency anemia, unspecified: Secondary | ICD-10-CM

## 2010-12-09 DIAGNOSIS — D518 Other vitamin B12 deficiency anemias: Secondary | ICD-10-CM

## 2010-12-09 MED ORDER — CYANOCOBALAMIN 1000 MCG/ML IJ SOLN
1000.0000 ug | Freq: Once | INTRAMUSCULAR | Status: AC
  Administered 2010-12-09: 1000 ug via INTRAMUSCULAR

## 2010-12-16 LAB — CBC
HCT: 30.9 — ABNORMAL LOW
MCHC: 34.4
Platelets: 194
RBC: 4.07
WBC: 10.3
WBC: 9.1

## 2010-12-16 LAB — RPR: RPR Ser Ql: NONREACTIVE

## 2011-01-06 ENCOUNTER — Ambulatory Visit (INDEPENDENT_AMBULATORY_CARE_PROVIDER_SITE_OTHER): Payer: BC Managed Care – PPO

## 2011-01-06 DIAGNOSIS — D518 Other vitamin B12 deficiency anemias: Secondary | ICD-10-CM

## 2011-01-06 DIAGNOSIS — D519 Vitamin B12 deficiency anemia, unspecified: Secondary | ICD-10-CM

## 2011-01-06 MED ORDER — CYANOCOBALAMIN 1000 MCG/ML IJ SOLN
1000.0000 ug | Freq: Once | INTRAMUSCULAR | Status: AC
  Administered 2011-01-06: 1000 ug via INTRAMUSCULAR

## 2011-01-12 ENCOUNTER — Encounter: Payer: Self-pay | Admitting: Internal Medicine

## 2011-01-13 ENCOUNTER — Ambulatory Visit (INDEPENDENT_AMBULATORY_CARE_PROVIDER_SITE_OTHER): Payer: BC Managed Care – PPO | Admitting: Internal Medicine

## 2011-01-13 ENCOUNTER — Encounter: Payer: Self-pay | Admitting: Internal Medicine

## 2011-01-13 DIAGNOSIS — F419 Anxiety disorder, unspecified: Secondary | ICD-10-CM

## 2011-01-13 DIAGNOSIS — F411 Generalized anxiety disorder: Secondary | ICD-10-CM

## 2011-01-13 DIAGNOSIS — G43909 Migraine, unspecified, not intractable, without status migrainosus: Secondary | ICD-10-CM

## 2011-01-13 MED ORDER — LORAZEPAM 0.5 MG PO TABS: 0.5000 mg | ORAL_TABLET | ORAL | Status: AC

## 2011-01-13 MED ORDER — GABAPENTIN 100 MG PO CAPS: 100.0000 mg | ORAL_CAPSULE | ORAL | Status: DC

## 2011-01-13 NOTE — Patient Instructions (Signed)

## 2011-01-13 NOTE — Progress Notes (Signed)
  Subjective:    Patient ID: Jaclyn Tucker, female    DOB: 1975-07-24, 35 y.o.   MRN: [id number]  HPI Stress: Triggers: work & 3 deaths in family, both grandfather & her husband's grandmother Onset:June Anxiety:this is main issue Depression:no Loss of interest (Anhedonia):no Panic attacks: isolated panic attacks in past 12 months X 3 Insomnia:no Anorexia:no Fatigue:no Neurologic signs/symptoms:intermittent Migraines; no  numbness , tingling, or  weakness Endocrinologic signs and symptoms: no hoarseness, weight change, vision change, temperature intolerance, bowel changes, skin/hair,/nail changes Family history of mental health issues, alcoholism or drug abuse: Anxiety & depression  In both sides of family Medications/efficacy:no meds, previously on Sertraline. She was very emotional on this; ? worsened anxiety. " I feel more human when I didn't take it"        Review of Systems     Objective:   Physical Exam  Gen.:  well-nourished; in no acute distress Eyes: Extraocular motion intact; no lid lag or proptosis Neck: full ROM ; thyroid normal Heart: Normal rhythm and rate without significant murmur, gallop, or extra heart sounds Lungs: Chest clear to auscultation without rales,rales, wheezes Neuro:Deep tendon reflexes are equal and within normal limits; no tremor  Skin: Warm and dry without significant lesions or rashes; no onycholysis Psych: Normally communicative and interactive; no abnormal mood or affect clinically.   MDQ: single   + answer; "no problem".           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 She felt that the symptoms were worse on sertraline. She generally states does not want to take the medication daily. The pathophysiology of serotonin deficiency was discussed and diagrammed. Her mood disorder questionnaire was essentially totally negative except for a single positive answer.  Based on this  history I recommended a trial of Cymbalta but she did not want to take this medicine. Her mother is on Xanax; I mentioned the high risk of addiction/ habituation with this medicine. I will give her a trial of low-dose lorazepam to be taken.  I encouraged her to review the discussion on Neurotransmitter deficiency.

## 2011-01-23 ENCOUNTER — Telehealth: Payer: Self-pay | Admitting: Internal Medicine

## 2011-01-23 NOTE — Telephone Encounter (Signed)
Spoke with patient and she said that she was really just looking for somebody else to hear her. She says that if another MD has reviewed the note and agrees with Alfonse Flavors that is fine. She will come and see him Wednesday again to discuss.

## 2011-01-23 NOTE — Telephone Encounter (Signed)
Message copied by Ellwood Dense on Mon Jan 23, 2011  1:02 PM ------      Message from: Sheliah Hatch      Created: Mon Jan 23, 2011 12:52 PM       i reviewed Dr Frederik Pear note from 11/2 on her anxiety and agree w/ his recommendations.  Since she didn't like his answer, she probably won't like mine.  i would be happy to see her and tell her i agree w/ him but if she's looking for a different answer she will want to see someone else.            kt      ----- Message -----         From: Ellwood Dense         Sent: 01/23/2011  12:48 PM           To: Neena Rhymes, MD            Pt is a pt of Dr. Alwyn Ren and has seen him for anxiety. She is wanting to get another opinion since her and Hopp don't seem to agree on her condition. Is this something that we do here within the physicians? If so she was wanting to see you for the second opinion.

## 2011-01-24 ENCOUNTER — Ambulatory Visit (INDEPENDENT_AMBULATORY_CARE_PROVIDER_SITE_OTHER): Payer: BC Managed Care – PPO | Admitting: Internal Medicine

## 2011-01-24 VITALS — BP 122/78 | HR 85 | Temp 98.4°F | Ht 64.0 in | Wt 157.0 lb

## 2011-01-24 DIAGNOSIS — F411 Generalized anxiety disorder: Secondary | ICD-10-CM

## 2011-01-24 MED ORDER — SERTRALINE HCL 50 MG PO TABS: ORAL_TABLET | ORAL | Status: DC

## 2011-01-24 NOTE — Progress Notes (Signed)
  Subjective:    Patient ID: Jaclyn Tucker, female    DOB: 01/16/1976, 35 y.o.   MRN: [id number]  HPI She has researched their  neurotransmitters deficiency and feels that she has generalized anxiety disorder. She has concerns with the upcoming holidays.  She's discussed the  possibility mood disorder with her husband; they both agree this is not an issue.  We again reviewed  Neurotransmitter  deficiency. She has not had panic attacks & is very anxious not to have this recur.  She thought about her options and wants to take sertraline and titrate the dose to 50 mg. The only problem she had with this  in the past was decreased  Libido and weight gain.    Review of Systems     Objective:   Physical Exam She is open & communicative; exhibits appropriate humor    She is well-groomed. Her understanding of her issues  is good. Her decisions are well thought out        Assessment & Plan:  #1 generalized anxiety disorder with a past history of panic attacks. Good response to sertraline but some adverse effects of weight gain and decreased libido. She still wants to try this agent because of her comfort zone. She will keep a diary to ascertain the intensity or severity of symptoms and response to therapy.

## 2011-01-24 NOTE — Patient Instructions (Signed)
Please keep a diary as we discussed to optimally evaluate the sertraline

## 2011-01-25 ENCOUNTER — Ambulatory Visit: Payer: BC Managed Care – PPO | Admitting: Internal Medicine

## 2011-02-08 ENCOUNTER — Ambulatory Visit (INDEPENDENT_AMBULATORY_CARE_PROVIDER_SITE_OTHER): Payer: BC Managed Care – PPO

## 2011-02-08 DIAGNOSIS — D518 Other vitamin B12 deficiency anemias: Secondary | ICD-10-CM

## 2011-02-08 MED ORDER — CYANOCOBALAMIN 1000 MCG/ML IJ SOLN
1000.0000 ug | Freq: Once | INTRAMUSCULAR | Status: AC
  Administered 2011-02-08: 1000 ug via INTRAMUSCULAR

## 2011-03-15 ENCOUNTER — Ambulatory Visit: Payer: BC Managed Care – PPO

## 2011-03-15 DIAGNOSIS — D519 Vitamin B12 deficiency anemia, unspecified: Secondary | ICD-10-CM

## 2011-03-15 MED ORDER — CYANOCOBALAMIN 1000 MCG/ML IJ SOLN
1000.0000 ug | Freq: Once | INTRAMUSCULAR | Status: AC
  Administered 2011-03-15: 1000 ug via INTRAMUSCULAR

## 2011-04-17 ENCOUNTER — Ambulatory Visit: Payer: BC Managed Care – PPO

## 2011-04-17 ENCOUNTER — Telehealth: Payer: Self-pay | Admitting: *Deleted

## 2011-04-17 NOTE — Telephone Encounter (Signed)
Triage Call Report Triage Record Num: [id number] Operator: Tarri Glenn Patient Name: Jaclyn Tucker Call Date & Time: 04/16/2011 8:42:23AM Patient Phone: [phone] PCP: Marga Melnick Patient Gender: Female PCP Fax : [phone] Patient DOB: Feb 11, 1976 Practice Name: [facility] Reason for Call: Caller: Wyolene/Patient; PCP: Marga Melnick; CB#: [phone]; Call regarding Patient needed cancel appt for 04/17/11; Patient is calling about need to cancel appt. for 04/17/11 @ 1100. Advised patient also to call office 04/17/11 to cancel. Verbalized understanding. Protocol(s) Used: Office Note Recommended Outcome per Protocol: Information Noted and Sent to Office Reason for Outcome: Caller information to office Care Advice: ~ 02/

## 2011-04-18 ENCOUNTER — Ambulatory Visit (INDEPENDENT_AMBULATORY_CARE_PROVIDER_SITE_OTHER): Payer: BC Managed Care – PPO

## 2011-04-18 DIAGNOSIS — D518 Other vitamin B12 deficiency anemias: Secondary | ICD-10-CM

## 2011-04-18 DIAGNOSIS — D519 Vitamin B12 deficiency anemia, unspecified: Secondary | ICD-10-CM

## 2011-04-18 MED ORDER — CYANOCOBALAMIN 1000 MCG/ML IJ SOLN
1000.0000 ug | Freq: Once | INTRAMUSCULAR | Status: AC
  Administered 2011-04-18: 1000 ug via INTRAMUSCULAR

## 2011-05-16 ENCOUNTER — Ambulatory Visit (INDEPENDENT_AMBULATORY_CARE_PROVIDER_SITE_OTHER): Payer: BC Managed Care – PPO

## 2011-05-16 DIAGNOSIS — D518 Other vitamin B12 deficiency anemias: Secondary | ICD-10-CM

## 2011-05-16 DIAGNOSIS — D519 Vitamin B12 deficiency anemia, unspecified: Secondary | ICD-10-CM

## 2011-05-16 MED ORDER — CYANOCOBALAMIN 1000 MCG/ML IJ SOLN
1000.0000 ug | Freq: Once | INTRAMUSCULAR | Status: AC
  Administered 2011-05-16: 1000 ug via INTRAMUSCULAR

## 2011-06-21 ENCOUNTER — Ambulatory Visit (INDEPENDENT_AMBULATORY_CARE_PROVIDER_SITE_OTHER): Payer: BC Managed Care – PPO

## 2011-06-21 DIAGNOSIS — D519 Vitamin B12 deficiency anemia, unspecified: Secondary | ICD-10-CM

## 2011-06-21 DIAGNOSIS — D518 Other vitamin B12 deficiency anemias: Secondary | ICD-10-CM

## 2011-06-21 MED ORDER — CYANOCOBALAMIN 1000 MCG/ML IJ SOLN
1000.0000 ug | Freq: Once | INTRAMUSCULAR | Status: AC
  Administered 2011-06-21: 1000 ug via INTRAMUSCULAR

## 2011-07-05 ENCOUNTER — Telehealth: Payer: Self-pay | Admitting: Internal Medicine

## 2011-07-05 MED ORDER — SERTRALINE HCL 50 MG PO TABS: 50.0000 mg | ORAL_TABLET | Freq: Every day | ORAL | Status: DC

## 2011-07-05 NOTE — Telephone Encounter (Signed)
RX sent

## 2011-07-05 NOTE — Telephone Encounter (Signed)
Refill sertraline HCL 50MG  Tab QTY 30 Take 1/2-tablet daily for 7-10 days, then 1-tablet daily  Last filled 3.30.2013  Last OV 11.13.12

## 2011-07-20 ENCOUNTER — Ambulatory Visit (INDEPENDENT_AMBULATORY_CARE_PROVIDER_SITE_OTHER): Payer: BC Managed Care – PPO | Admitting: *Deleted

## 2011-07-20 DIAGNOSIS — E538 Deficiency of other specified B group vitamins: Secondary | ICD-10-CM

## 2011-07-20 MED ORDER — CYANOCOBALAMIN 1000 MCG/ML IJ SOLN
1000.0000 ug | Freq: Once | INTRAMUSCULAR | Status: AC
  Administered 2011-07-20: 1000 ug via INTRAMUSCULAR

## 2011-08-18 ENCOUNTER — Ambulatory Visit (INDEPENDENT_AMBULATORY_CARE_PROVIDER_SITE_OTHER): Payer: BC Managed Care – PPO

## 2011-08-18 DIAGNOSIS — D519 Vitamin B12 deficiency anemia, unspecified: Secondary | ICD-10-CM

## 2011-08-18 DIAGNOSIS — D518 Other vitamin B12 deficiency anemias: Secondary | ICD-10-CM

## 2011-08-18 MED ORDER — CYANOCOBALAMIN 1000 MCG/ML IJ SOLN
1000.0000 ug | Freq: Once | INTRAMUSCULAR | Status: AC
  Administered 2011-08-18: 1000 ug via INTRAMUSCULAR

## 2011-09-15 ENCOUNTER — Ambulatory Visit (INDEPENDENT_AMBULATORY_CARE_PROVIDER_SITE_OTHER): Payer: BC Managed Care – PPO | Admitting: *Deleted

## 2011-09-15 DIAGNOSIS — D518 Other vitamin B12 deficiency anemias: Secondary | ICD-10-CM

## 2011-09-15 MED ORDER — CYANOCOBALAMIN 1000 MCG/ML IJ SOLN
1000.0000 ug | Freq: Once | INTRAMUSCULAR | Status: AC
  Administered 2011-09-15: 1000 ug via INTRAMUSCULAR

## 2011-10-17 ENCOUNTER — Ambulatory Visit (INDEPENDENT_AMBULATORY_CARE_PROVIDER_SITE_OTHER): Payer: BC Managed Care – PPO

## 2011-10-17 DIAGNOSIS — D518 Other vitamin B12 deficiency anemias: Secondary | ICD-10-CM

## 2011-10-17 DIAGNOSIS — D519 Vitamin B12 deficiency anemia, unspecified: Secondary | ICD-10-CM

## 2011-10-17 MED ORDER — CYANOCOBALAMIN 1000 MCG/ML IJ SOLN
1000.0000 ug | Freq: Once | INTRAMUSCULAR | Status: AC
  Administered 2011-10-17: 1000 ug via INTRAMUSCULAR

## 2011-11-17 ENCOUNTER — Emergency Department
Admission: EM | Admit: 2011-11-17 | Discharge: 2011-11-17 | Disposition: A | Discharge status: Home / Self Care | Payer: Self-pay | Source: Home / Self Care | Attending: Emergency Medicine | Admitting: Emergency Medicine

## 2011-11-17 ENCOUNTER — Encounter: Payer: Self-pay | Admitting: Emergency Medicine

## 2011-11-17 DIAGNOSIS — G43909 Migraine, unspecified, not intractable, without status migrainosus: Secondary | ICD-10-CM

## 2011-11-17 MED ORDER — TRAMADOL-ACETAMINOPHEN 37.5-325 MG PO TABS: ORAL_TABLET | ORAL | Status: AC

## 2011-11-17 MED ORDER — KETOROLAC TROMETHAMINE 60 MG/2ML IM SOLN
60.0000 mg | Freq: Once | INTRAMUSCULAR | Status: AC
  Administered 2011-11-17: 60 mg via INTRAMUSCULAR

## 2011-11-17 NOTE — ED Provider Notes (Signed)
History     CSN: [id number]  Arrival date & time 11/17/11  1944   First MD Initiated Contact with Patient 11/17/11 2015      Chief Complaint  Patient presents with  . Migraine     Patient is a 36 y.o. female presenting with migraine. The history is provided by the patient.  Migraine This is a recurrent problem. The current episode started yesterday. The problem occurs constantly (But pain level waxes and wanes). The problem has been gradually worsening. Pertinent negatives include no chest pain, no abdominal pain and no shortness of breath. The symptoms are aggravated by stress and exertion. Relieved by: Excedrin Migraine helped somewhat. Treatments tried: Excedrin Migraine. The treatment provided mild relief.  --- Onset last night of a dull right-sided headache, right orbital and right posterior occipital headache. Initially mild and dull, then worsened after she went for a run. Then, she went to sleep last night and awoke to 2: 30 a.m. with an excruciating, severe, stabbing right periorbital headache, 10 out of 10 in intensity, with minimal nasal congestion but no focal neurologic symptoms. She took 3 ibuprofen, went back to sleep and that helped somewhat. When she awoke later this morning, she still had dull mild diffuse bilateral headache, bifrontal, and took Excedrin migraine and that helped somewhat. Then, over past several hours, the diffuse sharp headache has worsened and now it 6/10 in intensity. She denies any fever or chills or colored rhinorrhea or URI symptoms. Denies seasonal sinus allergies. She admits to some stress. Denies any recent trauma. ---- She states that this is not the worst headache of her life. She has been getting severe headaches, onset 3 years ago, and gets about 8 per year and she feels they're associated with stress and she does not associate them with monthly menstrual cycle. She has seen Dr. 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 Alwyn Ren 3 months ago. She does not take gabapentin or other prophylactic medication.  Past Medical History  Diagnosis Date  . Anxiety   . Migraine     Past Surgical History  Procedure Date  . G2 p2     Family History  Problem Relation Age of Onset  . Diabetes Mother   . Hypertension Father   . Colon cancer Paternal Grandmother   . Cancer Paternal Grandmother     colon    History  Substance Use Topics  . Smoking status: Former Games developer  . Smokeless tobacco: Not on file   Comment: 5cigs/day  . Alcohol Use: Yes    OB History    Grav Para Term Preterm Abortions TAB SAB Ect Mult Living                  Review of Systems  Constitutional: Negative.  Negative for fever.  HENT: Positive for congestion (Mild right-sided nasal congestion when the headache was it's worst earlier this morning) and sinus pressure. Negative for hearing loss, nosebleeds, sore throat, facial swelling, rhinorrhea, sneezing, neck stiffness, dental problem, voice change and ear discharge.   Eyes: Positive for photophobia (Mild), pain (Had right orbital pain earlier this morning, improves somewhat with Excedrin Migraine) and visual disturbance (Slight blurry vision, but no other visual abnormality. No scotomas or flashing lights). Negative for discharge, redness and itching.  Respiratory: Negative.  Negative for chest tightness, shortness of breath and wheezing.   Cardiovascular:  Negative for chest pain, palpitations and leg swelling.  Gastrointestinal: Negative for nausea, vomiting, abdominal pain, diarrhea, constipation, blood in stool and abdominal distention.  Genitourinary: Negative.   Musculoskeletal: Negative.   Skin: Negative.  Negative for rash.  Neurological: Negative for dizziness, tremors, seizures, syncope, speech difficulty, weakness, light-headedness and numbness.  Hematological:  Negative.   Psychiatric/Behavioral: Negative for suicidal ideas, hallucinations, confusion and self-injury. The patient is nervous/anxious (Mild anxiety from stress).     Allergies  Penicillins; Moxifloxacin; and Sumatriptan succinate  Home Medications   Current Outpatient Rx  Name Route Sig Dispense Refill  . ASPIRIN-ACETAMINOPHEN-CAFFEINE 250-250-65 MG PO TABS Oral Take 1 tablet by mouth every 6 (six) hours as needed.    Marland Kitchen GABAPENTIN 100 MG PO CAPS Oral Take 1 capsule (100 mg total) by mouth as directed. One every 8 hours as needed 60 capsule 1  . LORAZEPAM 0.5 MG PO TABS Oral Take 1 tablet (0.5 mg total) by mouth as directed. 1 pill every 8-12 hours as needed for anxiety 30 tablet 1  . SERTRALINE HCL 50 MG PO TABS  1/2 qd X 7-10 days 30 tablet 2  . SERTRALINE HCL 50 MG PO TABS Oral Take 1 tablet (50 mg total) by mouth daily. 90 tablet 0  . TRAMADOL-ACETAMINOPHEN 37.5-325 MG PO TABS  1 or 2 every 4-6 hours as needed for moderate-severe pain 20 tablet 0    BP 125/84  Pulse 71  Temp 98.1 F (36.7 C) (Oral)  Resp 16  Ht 5\' 4"  (1.626 m)  Wt 163 lb (73.936 kg)  BMI 27.98 kg/m2  SpO2 98%  Physical Exam  Nursing note and vitals reviewed. Constitutional: She appears well-developed and well-nourished. She appears distressed (Uncomfortable from headache.).  HENT:  Head: Normocephalic and atraumatic. Head is without raccoon's eyes, without contusion, without right periorbital erythema and without left periorbital erythema.  Right Ear: External ear and ear canal normal. No drainage. Tympanic membrane is not erythematous.  Left Ear: External ear and ear canal normal. No drainage. Tympanic membrane is not erythematous.  Nose: Nose normal. Right sinus exhibits no maxillary sinus tenderness. Left sinus exhibits no maxillary sinus tenderness.  Mouth/Throat: Oropharynx is clear and moist and mucous membranes are normal. No oral lesions. No dental abscesses. No oropharyngeal exudate, posterior  oropharyngeal edema or posterior oropharyngeal erythema.       Minimal bilateral bifrontal tenderness without deformity. TMJ functions normally bilaterally.    No cranial bruits.  Normal temporal arteries.  Eyes: Conjunctivae, EOM and lids are normal. Pupils are equal, round, and reactive to light.  Fundoscopic exam:      The right eye shows no exudate, no hemorrhage and no papilledema.       The left eye shows no exudate, no hemorrhage and no papilledema.  Neck: Neck supple. Normal carotid pulses and no JVD present. Carotid bruit is not present. No tracheal deviation present. No thyromegaly present.  Cardiovascular: Regular rhythm and normal heart sounds.   Pulmonary/Chest: Effort normal and breath sounds normal. No respiratory distress. She has no wheezes. She has no rales.  Abdominal: Soft. There is no tenderness.  Musculoskeletal: She exhibits no edema and no tenderness.  Neurological: She is alert. She has normal strength and normal reflexes. No cranial nerve deficit or sensory deficit. Coordination normal.  Reflex Scores:      Tricep reflexes are 2+ on the right side and 2+ on the left side.      Bicep reflexes are 2+ on the right side  and 2+ on the left side.      Brachioradialis reflexes are 2+ on the right side and 2+ on the left side.      Patellar reflexes are 2+ on the right side and 2+ on the left side.      Achilles reflexes are 2+ on the right side and 2+ on the left side. Skin: Skin is warm and dry. No rash noted. She is not diaphoretic.  Psychiatric: She has a normal mood and affect.    ED Course  Procedures (including critical care time)   1. Migraine     MDM  No evidence of any acute intracranial abnormality. Most likely, diagnosis his migraine variant, such as cluster headache but also mixed muscle contraction headache. We discussed this at length. Questions invited and answered. We treated with acute injection of Toradol 60 mg, and patient reevaluated 25  minutes later, and she felt significantly improved with headache level in 3-4 range out of 10. Both written and verbal instructions given. See detailed Instructions in AVS, which were given to parent. Verbal instructions also given. Risks, benefits, and alternatives of treatment options discussed. Questions invited and answered.  Parent voiced understanding and agreement with plans.  Red flags discussed. She understands to go to ED stat if she has any severe worsening headache. Written prescription for Ultracet prescribed. #20, no refills. May use ibuprofen or Excedrin Migraine when necessary. Followup with PCP regarding other medications. Names of neurologists/headache specialist given and I urged her to call and make an appointment for ongoing prevention and treatment of chronic headaches. She voiced understanding and agreement.         Lajean Manes, MD 11/17/11 2056

## 2011-11-17 NOTE — ED Notes (Signed)
Migraine since last night.

## 2011-11-20 ENCOUNTER — Ambulatory Visit (INDEPENDENT_AMBULATORY_CARE_PROVIDER_SITE_OTHER): Payer: BC Managed Care – PPO

## 2011-11-20 DIAGNOSIS — D519 Vitamin B12 deficiency anemia, unspecified: Secondary | ICD-10-CM

## 2011-11-20 DIAGNOSIS — D518 Other vitamin B12 deficiency anemias: Secondary | ICD-10-CM

## 2011-11-20 MED ORDER — CYANOCOBALAMIN 1000 MCG/ML IJ SOLN
1000.0000 ug | Freq: Once | INTRAMUSCULAR | Status: AC
  Administered 2011-11-20: 1000 ug via INTRAMUSCULAR

## 2011-12-21 ENCOUNTER — Ambulatory Visit: Payer: BC Managed Care – PPO

## 2011-12-22 ENCOUNTER — Ambulatory Visit (INDEPENDENT_AMBULATORY_CARE_PROVIDER_SITE_OTHER): Payer: BC Managed Care – PPO | Admitting: *Deleted

## 2011-12-22 DIAGNOSIS — E538 Deficiency of other specified B group vitamins: Secondary | ICD-10-CM

## 2011-12-22 MED ORDER — CYANOCOBALAMIN 1000 MCG/ML IJ SOLN
1000.0000 ug | Freq: Once | INTRAMUSCULAR | Status: AC
  Administered 2011-12-22: 1000 ug via INTRAMUSCULAR

## 2012-01-19 ENCOUNTER — Ambulatory Visit: Payer: BC Managed Care – PPO

## 2012-01-22 ENCOUNTER — Ambulatory Visit (INDEPENDENT_AMBULATORY_CARE_PROVIDER_SITE_OTHER): Payer: BC Managed Care – PPO

## 2012-01-22 DIAGNOSIS — D519 Vitamin B12 deficiency anemia, unspecified: Secondary | ICD-10-CM

## 2012-01-22 DIAGNOSIS — D518 Other vitamin B12 deficiency anemias: Secondary | ICD-10-CM

## 2012-01-22 MED ORDER — CYANOCOBALAMIN 1000 MCG/ML IJ SOLN
1000.0000 ug | Freq: Once | INTRAMUSCULAR | Status: AC
  Administered 2012-01-22: 1000 ug via INTRAMUSCULAR

## 2012-02-22 ENCOUNTER — Ambulatory Visit (INDEPENDENT_AMBULATORY_CARE_PROVIDER_SITE_OTHER): Payer: BC Managed Care – PPO

## 2012-02-22 DIAGNOSIS — D519 Vitamin B12 deficiency anemia, unspecified: Secondary | ICD-10-CM

## 2012-02-22 DIAGNOSIS — D518 Other vitamin B12 deficiency anemias: Secondary | ICD-10-CM

## 2012-02-22 MED ORDER — CYANOCOBALAMIN 1000 MCG/ML IJ SOLN
1000.0000 ug | Freq: Once | INTRAMUSCULAR | Status: AC
  Administered 2012-02-22: 1000 ug via INTRAMUSCULAR

## 2012-03-25 ENCOUNTER — Ambulatory Visit (INDEPENDENT_AMBULATORY_CARE_PROVIDER_SITE_OTHER): Payer: BC Managed Care – PPO

## 2012-03-25 ENCOUNTER — Ambulatory Visit: Payer: BC Managed Care – PPO

## 2012-03-25 DIAGNOSIS — D519 Vitamin B12 deficiency anemia, unspecified: Secondary | ICD-10-CM

## 2012-03-25 DIAGNOSIS — D518 Other vitamin B12 deficiency anemias: Secondary | ICD-10-CM

## 2012-03-25 MED ORDER — CYANOCOBALAMIN 1000 MCG/ML IJ SOLN
1000.0000 ug | Freq: Once | INTRAMUSCULAR | Status: AC
  Administered 2012-03-25: 1000 ug via INTRAMUSCULAR

## 2012-04-30 ENCOUNTER — Telehealth: Payer: Self-pay | Admitting: Internal Medicine

## 2012-04-30 MED ORDER — CYANOCOBALAMIN 500 MCG/0.1ML NA SOLN: NASAL | Status: DC

## 2012-04-30 NOTE — Telephone Encounter (Signed)
Patient called to make appt for b12 injection. I advised pt b12 is on back order. Patient is requesting b12 nasal spray be called into [facility].

## 2012-04-30 NOTE — Telephone Encounter (Signed)
RX sent

## 2012-05-16 ENCOUNTER — Ambulatory Visit (INDEPENDENT_AMBULATORY_CARE_PROVIDER_SITE_OTHER): Payer: BC Managed Care – PPO

## 2012-05-16 DIAGNOSIS — D519 Vitamin B12 deficiency anemia, unspecified: Secondary | ICD-10-CM

## 2012-05-16 DIAGNOSIS — D518 Other vitamin B12 deficiency anemias: Secondary | ICD-10-CM

## 2012-05-16 MED ORDER — CYANOCOBALAMIN 1000 MCG/ML IJ SOLN
1000.0000 ug | Freq: Once | INTRAMUSCULAR | Status: AC
  Administered 2012-05-16: 1000 ug via INTRAMUSCULAR

## 2012-05-17 ENCOUNTER — Ambulatory Visit: Payer: BC Managed Care – PPO

## 2012-06-26 ENCOUNTER — Ambulatory Visit (INDEPENDENT_AMBULATORY_CARE_PROVIDER_SITE_OTHER): Payer: BC Managed Care – PPO | Admitting: *Deleted

## 2012-06-26 DIAGNOSIS — E538 Deficiency of other specified B group vitamins: Secondary | ICD-10-CM

## 2012-06-26 MED ORDER — CYANOCOBALAMIN 1000 MCG/ML IJ SOLN
1000.0000 ug | Freq: Once | INTRAMUSCULAR | Status: AC
  Administered 2012-06-26: 1000 ug via INTRAMUSCULAR

## 2012-07-26 ENCOUNTER — Ambulatory Visit (INDEPENDENT_AMBULATORY_CARE_PROVIDER_SITE_OTHER): Payer: BC Managed Care – PPO

## 2012-07-26 DIAGNOSIS — D518 Other vitamin B12 deficiency anemias: Secondary | ICD-10-CM

## 2012-07-26 DIAGNOSIS — D519 Vitamin B12 deficiency anemia, unspecified: Secondary | ICD-10-CM

## 2012-07-26 MED ORDER — CYANOCOBALAMIN 1000 MCG/ML IJ SOLN
1000.0000 ug | Freq: Once | INTRAMUSCULAR | Status: AC
  Administered 2012-07-26: 1000 ug via INTRAMUSCULAR

## 2012-08-23 ENCOUNTER — Ambulatory Visit (INDEPENDENT_AMBULATORY_CARE_PROVIDER_SITE_OTHER): Payer: BC Managed Care – PPO | Admitting: *Deleted

## 2012-08-23 DIAGNOSIS — E538 Deficiency of other specified B group vitamins: Secondary | ICD-10-CM

## 2012-08-23 MED ORDER — CYANOCOBALAMIN 1000 MCG/ML IJ SOLN
1000.0000 ug | Freq: Once | INTRAMUSCULAR | Status: AC
  Administered 2012-08-23: 1000 ug via INTRAMUSCULAR

## 2012-09-10 LAB — HM PAP SMEAR: HM PAP: NORMAL

## 2012-10-03 ENCOUNTER — Ambulatory Visit (INDEPENDENT_AMBULATORY_CARE_PROVIDER_SITE_OTHER): Payer: BC Managed Care – PPO

## 2012-10-03 DIAGNOSIS — E538 Deficiency of other specified B group vitamins: Secondary | ICD-10-CM

## 2012-10-03 MED ORDER — CYANOCOBALAMIN 1000 MCG/ML IJ SOLN
1000.0000 ug | Freq: Once | INTRAMUSCULAR | Status: AC
  Administered 2012-10-03: 1000 ug via INTRAMUSCULAR

## 2012-10-30 ENCOUNTER — Ambulatory Visit (INDEPENDENT_AMBULATORY_CARE_PROVIDER_SITE_OTHER): Payer: BC Managed Care – PPO

## 2012-10-30 DIAGNOSIS — D518 Other vitamin B12 deficiency anemias: Secondary | ICD-10-CM

## 2012-10-30 MED ORDER — CYANOCOBALAMIN 1000 MCG/ML IJ SOLN
1000.0000 ug | Freq: Once | INTRAMUSCULAR | Status: AC
  Administered 2012-10-30: 1000 ug via INTRAMUSCULAR

## 2012-12-06 ENCOUNTER — Ambulatory Visit (INDEPENDENT_AMBULATORY_CARE_PROVIDER_SITE_OTHER): Payer: BC Managed Care – PPO | Admitting: *Deleted

## 2012-12-06 DIAGNOSIS — E538 Deficiency of other specified B group vitamins: Secondary | ICD-10-CM

## 2012-12-06 MED ORDER — CYANOCOBALAMIN 1000 MCG/ML IJ SOLN
1000.0000 ug | Freq: Once | INTRAMUSCULAR | Status: AC
  Administered 2012-12-06: 1000 ug via INTRAMUSCULAR

## 2013-01-06 ENCOUNTER — Ambulatory Visit (INDEPENDENT_AMBULATORY_CARE_PROVIDER_SITE_OTHER): Payer: BC Managed Care – PPO | Admitting: *Deleted

## 2013-01-06 DIAGNOSIS — E538 Deficiency of other specified B group vitamins: Secondary | ICD-10-CM

## 2013-01-06 MED ORDER — CYANOCOBALAMIN 1000 MCG/ML IJ SOLN
1000.0000 ug | Freq: Once | INTRAMUSCULAR | Status: AC
  Administered 2013-01-06: 1000 ug via INTRAMUSCULAR

## 2013-01-16 ENCOUNTER — Other Ambulatory Visit: Payer: Self-pay

## 2013-02-04 ENCOUNTER — Ambulatory Visit (INDEPENDENT_AMBULATORY_CARE_PROVIDER_SITE_OTHER): Payer: BC Managed Care – PPO | Admitting: *Deleted

## 2013-02-04 DIAGNOSIS — E538 Deficiency of other specified B group vitamins: Secondary | ICD-10-CM

## 2013-02-04 MED ORDER — CYANOCOBALAMIN 1000 MCG/ML IJ SOLN
1000.0000 ug | Freq: Once | INTRAMUSCULAR | Status: AC
  Administered 2013-02-04: 1000 ug via INTRAMUSCULAR

## 2013-03-05 ENCOUNTER — Ambulatory Visit (INDEPENDENT_AMBULATORY_CARE_PROVIDER_SITE_OTHER): Payer: BC Managed Care – PPO | Admitting: *Deleted

## 2013-03-05 DIAGNOSIS — E538 Deficiency of other specified B group vitamins: Secondary | ICD-10-CM

## 2013-03-05 MED ORDER — CYANOCOBALAMIN 1000 MCG/ML IJ SOLN
1000.0000 ug | Freq: Once | INTRAMUSCULAR | Status: AC
  Administered 2013-03-05: 1000 ug via INTRAMUSCULAR

## 2013-04-09 ENCOUNTER — Encounter: Payer: Self-pay | Admitting: Family Medicine

## 2013-04-09 ENCOUNTER — Ambulatory Visit: Payer: BC Managed Care – PPO

## 2013-04-09 ENCOUNTER — Ambulatory Visit (INDEPENDENT_AMBULATORY_CARE_PROVIDER_SITE_OTHER): Payer: BC Managed Care – PPO | Admitting: Family Medicine

## 2013-04-09 VITALS — BP 124/78 | HR 83 | Temp 98.0°F | Resp 16 | Ht 64.0 in | Wt 168.4 lb

## 2013-04-09 DIAGNOSIS — D518 Other vitamin B12 deficiency anemias: Secondary | ICD-10-CM

## 2013-04-09 DIAGNOSIS — G43909 Migraine, unspecified, not intractable, without status migrainosus: Secondary | ICD-10-CM

## 2013-04-09 DIAGNOSIS — F411 Generalized anxiety disorder: Secondary | ICD-10-CM

## 2013-04-09 LAB — CBC WITH DIFFERENTIAL/PLATELET
BASOS ABS: 0 10*3/uL (ref 0.0–0.1)
Basophils Relative: 0.4 % (ref 0.0–3.0)
Eosinophils Absolute: 0.1 10*3/uL (ref 0.0–0.7)
Eosinophils Relative: 1.7 % (ref 0.0–5.0)
HEMATOCRIT: 41.7 % (ref 36.0–46.0)
HEMOGLOBIN: 13.7 g/dL (ref 12.0–15.0)
LYMPHS ABS: 2.2 10*3/uL (ref 0.7–4.0)
Lymphocytes Relative: 33.7 % (ref 12.0–46.0)
MCHC: 32.9 g/dL (ref 30.0–36.0)
MCV: 89 fl (ref 78.0–100.0)
MONOS PCT: 5.7 % (ref 3.0–12.0)
Monocytes Absolute: 0.4 10*3/uL (ref 0.1–1.0)
NEUTROS ABS: 3.8 10*3/uL (ref 1.4–7.7)
Neutrophils Relative %: 58.5 % (ref 43.0–77.0)
Platelets: 290 10*3/uL (ref 150.0–400.0)
RBC: 4.68 Mil/uL (ref 3.87–5.11)
RDW: 13.1 % (ref 11.5–14.6)
WBC: 6.4 10*3/uL (ref 4.5–10.5)

## 2013-04-09 LAB — VITAMIN B12: Vitamin B-12: >1500 pg/mL — ABNORMAL HIGH (ref 211–911)

## 2013-04-09 LAB — TSH: TSH: 0.7 u[IU]/mL (ref 0.35–5.50)

## 2013-04-09 MED ORDER — CYANOCOBALAMIN 1000 MCG/ML IJ SOLN
1000.0000 ug | Freq: Once | INTRAMUSCULAR | Status: AC
  Administered 2013-04-09: 1000 ug via INTRAMUSCULAR

## 2013-04-09 NOTE — Patient Instructions (Signed)
Schedule your complete physical in 6 months We'll notify you of your lab results and make any changes if needed Keep up the good work!  You look great! Call with any questions or concerns Welcome!  We're glad to have you!!! 

## 2013-04-09 NOTE — Assessment & Plan Note (Signed)
New to provider, chronic for pt.  Treated by GYN w/ xanax.  Feel this is appropriate given the infrequent use of benzos.  Will follow.

## 2013-04-09 NOTE — Assessment & Plan Note (Signed)
New to provider, ongoing for pt.  Continue gabapentin prn.  No changes at this time.

## 2013-04-09 NOTE — Progress Notes (Signed)
   Subjective:    Patient ID: Jaclyn Tucker, female    DOB: [birth year], 38 y.o.   MRN: [id number]  HPI New to establish.  Previous MD- Hopp  B12 deficiency- last B12 checked 2012.  Having injxns for 'at least 7 yrs', getting injxns monthly.    Anxiety- being treated by Dr Harrington Challenger (GYN), taking xanax as needed.  Gets script for 60 pills/yr and still has 1/2 prescription left.  Using very infrequently.  Has been on Zoloft previously and this worsened anxiety and caused excessive fatigue.  Migraines- chronic problem, occuring once every 2 months on average.  Taking gabapentin PRN for migraines.  This is effective in treating her sxs   Review of Systems For ROS see HPI     Objective:   Physical Exam  Vitals reviewed. Constitutional: She is oriented to person, place, and time. She appears well-developed and well-nourished. No distress.  HENT:  Head: Normocephalic and atraumatic.  Eyes: Conjunctivae and EOM are normal. Pupils are equal, round, and reactive to light.  Neck: Normal range of motion. Neck supple. No thyromegaly present.  Cardiovascular: Normal rate, regular rhythm, normal heart sounds and intact distal pulses.   No murmur heard. Pulmonary/Chest: Effort normal and breath sounds normal. No respiratory distress.  Musculoskeletal: She exhibits no edema.  Lymphadenopathy:    She has no cervical adenopathy.  Neurological: She is alert and oriented to person, place, and time.  Skin: Skin is warm and dry.  Psychiatric: She has a normal mood and affect. Her behavior is normal.          Assessment & Plan:

## 2013-04-09 NOTE — Assessment & Plan Note (Signed)
New to provider, chronic for pt.  Check labs.  B12 injxn given today.  Will follow.

## 2013-07-07 ENCOUNTER — Emergency Department (HOSPITAL_BASED_OUTPATIENT_CLINIC_OR_DEPARTMENT_OTHER): Payer: BC Managed Care – PPO

## 2013-07-07 ENCOUNTER — Emergency Department (HOSPITAL_BASED_OUTPATIENT_CLINIC_OR_DEPARTMENT_OTHER)
Admission: EM | Admit: 2013-07-07 | Discharge: 2013-07-07 | Disposition: A | Discharge status: Home / Self Care | Payer: BC Managed Care – PPO | Attending: Emergency Medicine | Admitting: Emergency Medicine

## 2013-07-07 ENCOUNTER — Encounter (HOSPITAL_BASED_OUTPATIENT_CLINIC_OR_DEPARTMENT_OTHER): Payer: Self-pay | Admitting: Emergency Medicine

## 2013-07-07 DIAGNOSIS — Z79899 Other long term (current) drug therapy: Secondary | ICD-10-CM | POA: Insufficient documentation

## 2013-07-07 DIAGNOSIS — G43909 Migraine, unspecified, not intractable, without status migrainosus: Secondary | ICD-10-CM | POA: Insufficient documentation

## 2013-07-07 DIAGNOSIS — M25569 Pain in unspecified knee: Secondary | ICD-10-CM | POA: Insufficient documentation

## 2013-07-07 DIAGNOSIS — R079 Chest pain, unspecified: Secondary | ICD-10-CM

## 2013-07-07 DIAGNOSIS — Z880 Allergy status to penicillin: Secondary | ICD-10-CM | POA: Insufficient documentation

## 2013-07-07 DIAGNOSIS — Z87891 Personal history of nicotine dependence: Secondary | ICD-10-CM | POA: Insufficient documentation

## 2013-07-07 DIAGNOSIS — R0789 Other chest pain: Secondary | ICD-10-CM | POA: Insufficient documentation

## 2013-07-07 DIAGNOSIS — F411 Generalized anxiety disorder: Secondary | ICD-10-CM | POA: Insufficient documentation

## 2013-07-07 LAB — BASIC METABOLIC PANEL
BUN: 10 mg/dL (ref 6–23)
CALCIUM: 9.7 mg/dL (ref 8.4–10.5)
CO2: 25 meq/L (ref 19–32)
CREATININE: 0.9 mg/dL (ref 0.50–1.10)
Chloride: 100 mEq/L (ref 96–112)
GFR calc Af Amer: >90 mL/min (ref >90)
GFR calc non Af Amer: 82 mL/min — ABNORMAL LOW (ref >90)
GLUCOSE: 96 mg/dL (ref 70–99)
Potassium: 4.2 mEq/L (ref 3.7–5.3)
Sodium: 139 mEq/L (ref 137–147)

## 2013-07-07 LAB — CBC WITH DIFFERENTIAL/PLATELET
BASOS ABS: 0 10*3/uL (ref 0.0–0.1)
Basophils Relative: 0 % (ref 0–1)
EOS PCT: 1 % (ref 0–5)
Eosinophils Absolute: 0.1 10*3/uL (ref 0.0–0.7)
HEMATOCRIT: 44.3 % (ref 36.0–46.0)
HEMOGLOBIN: 15.2 g/dL — AB (ref 12.0–15.0)
LYMPHS ABS: 3.2 10*3/uL (ref 0.7–4.0)
LYMPHS PCT: 33 % (ref 12–46)
MCH: 30.2 pg (ref 26.0–34.0)
MCHC: 34.3 g/dL (ref 30.0–36.0)
MCV: 87.9 fL (ref 78.0–100.0)
MONO ABS: 0.7 10*3/uL (ref 0.1–1.0)
MONOS PCT: 7 % (ref 3–12)
Neutro Abs: 5.7 10*3/uL (ref 1.7–7.7)
Neutrophils Relative %: 59 % (ref 43–77)
Platelets: 318 10*3/uL (ref 150–400)
RBC: 5.04 MIL/uL (ref 3.87–5.11)
RDW: 12.7 % (ref 11.5–15.5)
WBC: 9.7 10*3/uL (ref 4.0–10.5)

## 2013-07-07 LAB — D-DIMER, QUANTITATIVE: D-Dimer, Quant: <0.27 ug/mL-FEU (ref 0.00–0.48)

## 2013-07-07 MED ORDER — IBUPROFEN 800 MG PO TABS: 800.0000 mg | ORAL_TABLET | Freq: Three times a day (TID) | ORAL | Status: DC

## 2013-07-07 MED ORDER — TRAMADOL HCL 50 MG PO TABS: 50.0000 mg | ORAL_TABLET | Freq: Four times a day (QID) | ORAL | Status: DC | PRN

## 2013-07-07 NOTE — ED Provider Notes (Signed)
CSN: [id number]     Arrival date & time 07/07/13  1746 History  This chart was scribed for Orpah Greek, MD by Celesta Gentile, ED Scribe. The patient was seen in room MH08/MH08. Patient's care was started at 7:42 PM.  Chief Complaint  Patient presents with  . Leg Pain   The history is provided by the patient. No language interpreter was used.   HPI Comments: Rosezella Kronick is a 38 y.o. female who presents to the Emergency Department complaining of constant worsening left leg pain that started early this morning.  She states the pain is located in her left knee and lower leg.  Pt describes the pain as burning.  She denies any known injuries.  She states the pain is worse with movement, especially flexion.  Pt states over the weekend she was experiencing chest pain.  Pt states the chest pain is intermittent and only last a few seconds at a time.  She denies SOB, fevers, and chills.     Past Medical History  Diagnosis Date  . Anxiety   . Migraine    Past Surgical History  Procedure Laterality Date  . G2 p2     Family History  Problem Relation Age of Onset  . Diabetes Mother   . Hypertension Father   . Colon cancer Paternal Grandmother   . Cancer Paternal Grandmother     colon   History  Substance Use Topics  . Smoking status: Former Smoker    Types: Cigarettes    Quit date: 04/09/2010  . Smokeless tobacco: Not on file     Comment: 5cigs/day  . Alcohol Use: Yes   OB History   Grav Para Term Preterm Abortions TAB SAB Ect Mult Living                 Review of Systems  Constitutional: Negative for fever and chills.  Respiratory: Negative for cough, chest tightness and shortness of breath.   Cardiovascular: Positive for chest pain. Negative for leg swelling.  Gastrointestinal: Negative for nausea, vomiting and abdominal pain.  Musculoskeletal: Positive for arthralgias (Left knee). Negative for back pain, joint swelling, neck pain and neck stiffness.  Skin: Negative  for color change and rash.  Neurological: Negative for headaches.  Psychiatric/Behavioral: Negative for behavioral problems and confusion.  All other systems reviewed and are negative.   Allergies  Penicillins; Moxifloxacin; and Sumatriptan succinate  Home Medications   Prior to Admission medications   Medication Sig Start Date End Date Taking? Authorizing Provider  ALPRAZolam (XANAX PO) Take by mouth.   Yes Historical Provider, MD  gabapentin (NEURONTIN) 100 MG capsule Take 1 capsule (100 mg total) by mouth as directed. One every 8 hours as needed 01/13/11   Hendricks Limes, MD   Triage Vitals: BP 124/77  Pulse 78  Temp(Src) 98.9 F (37.2 C) (Oral)  Resp 18  SpO2 100%  LMP 06/25/2013  Physical Exam  Constitutional: She is oriented to person, place, and time. She appears well-developed and well-nourished. No distress.  HENT:  Head: Normocephalic and atraumatic.  Right Ear: Hearing normal.  Left Ear: Hearing normal.  Nose: Nose normal.  Mouth/Throat: Oropharynx is clear and moist and mucous membranes are normal.  Eyes: Conjunctivae and EOM are normal. Pupils are equal, round, and reactive to light.  Neck: Normal range of motion. Neck supple.  Cardiovascular: Normal rate, regular rhythm, S1 normal, S2 normal, normal heart sounds and intact distal pulses.  Exam reveals no gallop and no friction  rub.   No murmur heard. Pulses:      Dorsalis pedis pulses are 2+ on the left side.       Posterior tibial pulses are 2+ on the left side.  No venous nodules.    Pulmonary/Chest: Effort normal and breath sounds normal. No respiratory distress. She has no wheezes. She has no rales. She exhibits no tenderness.  Abdominal: Soft. Normal appearance and bowel sounds are normal. There is no hepatosplenomegaly. There is no tenderness. There is no rebound, no guarding, no tenderness at McBurney's point and negative Murphy's sign. No hernia.  Musculoskeletal:       Left knee: She exhibits  decreased range of motion. She exhibits no swelling, no ecchymosis, no deformity and no erythema.  Medial joint line tenderness of left knee.  No calf tenderness.  Pain with flexion and extension.  Neurological: She is alert and oriented to person, place, and time. She has normal strength. No cranial nerve deficit or sensory deficit. Coordination normal. GCS eye subscore is 4. GCS verbal subscore is 5. GCS motor subscore is 6.  Sensation and Motor intact.    Skin: Skin is warm, dry and intact. No rash noted. No cyanosis.  Psychiatric: She has a normal mood and affect. Her speech is normal and behavior is normal. Thought content normal.    ED Course  Procedures (including critical care time) DIAGNOSTIC STUDIES: Oxygen Saturation is 100% on RA, normal by my interpretation.    COORDINATION OF CARE: 7:51 PM-Will order [place] and chest x-ray.  Will order CBC, basic metabolic panel, and d-dimer.  Patient informed of current plan of treatment and evaluation and agrees with plan.    Results for orders placed during the hospital encounter of 07/07/13  D-DIMER, QUANTITATIVE      Result Value Ref Range   D-Dimer, Quant <0.27  0.00 - 0.48 ug/mL-FEU  CBC WITH DIFFERENTIAL      Result Value Ref Range   WBC 9.7  4.0 - 10.5 K/uL   RBC 5.04  3.87 - 5.11 MIL/uL   Hemoglobin 15.2 (*) 12.0 - 15.0 g/dL   HCT 44.3  36.0 - 46.0 %   MCV 87.9  78.0 - 100.0 fL   MCH 30.2  26.0 - 34.0 pg   MCHC 34.3  30.0 - 36.0 g/dL   RDW 12.7  11.5 - 15.5 %   Platelets 318  150 - 400 K/uL   Neutrophils Relative % 59  43 - 77 %   Neutro Abs 5.7  1.7 - 7.7 K/uL   Lymphocytes Relative 33  12 - 46 %   Lymphs Abs 3.2  0.7 - 4.0 K/uL   Monocytes Relative 7  3 - 12 %   Monocytes Absolute 0.7  0.1 - 1.0 K/uL   Eosinophils Relative 1  0 - 5 %   Eosinophils Absolute 0.1  0.0 - 0.7 K/uL   Basophils Relative 0  0 - 1 %   Basophils Absolute 0.0  0.0 - 0.1 K/uL  BASIC METABOLIC PANEL      Result Value Ref Range   Sodium 139  137 -  147 mEq/L   Potassium 4.2  3.7 - 5.3 mEq/L   Chloride 100  96 - 112 mEq/L   CO2 25  19 - 32 mEq/L   Glucose, Bld 96  70 - 99 mg/dL   BUN 10  6 - 23 mg/dL   Creatinine, Ser 0.90  0.50 - 1.10 mg/dL   Calcium 9.7  8.4 -  10.5 mg/dL   GFR calc non Af Amer 82 (*) >90 mL/min   GFR calc Af Amer >90  >90 mL/min    Imaging Review Dg Chest 2 View  07/07/2013   CLINICAL DATA:  Left-sided chest pain  EXAM: CHEST  2 VIEW  COMPARISON:  DG CHEST 2 VIEW dated 01/05/2009  FINDINGS: The heart size and mediastinal contours are within normal limits. Both lungs are clear. The visualized skeletal structures are unremarkable.  IMPRESSION: No active cardiopulmonary disease.   Electronically Signed   By: Kathreen Devoid   On: 07/07/2013 21:36   US Venous Img Lower Unilateral Left  07/07/2013   CLINICAL DATA:  Medial knee pain.  EXAM: Left LOWER EXTREMITY VENOUS DOPPLER ULTRASOUND  TECHNIQUE: Gray-scale sonography with graded compression, as well as color Doppler and duplex ultrasound were performed to evaluate the lower extremity deep venous systems from the level of the common femoral vein and including the common femoral, femoral, profunda femoral, popliteal and calf veins including the posterior tibial, peroneal and gastrocnemius veins when visible. The superficial great saphenous vein was also interrogated. Spectral Doppler was utilized to evaluate flow at rest and with distal augmentation maneuvers in the common femoral, femoral and popliteal veins.  COMPARISON:  None.  FINDINGS: Common Femoral Vein: No evidence of thrombus. Normal compressibility, respiratory phasicity and response to augmentation.  Saphenofemoral Junction: No evidence of thrombus. Normal compressibility and flow on color Doppler imaging.  Profunda Femoral Vein: No evidence of thrombus. Normal compressibility and flow on color Doppler imaging.  Femoral Vein: No evidence of thrombus. Normal compressibility, respiratory phasicity and response to  augmentation.  Popliteal Vein: No evidence of thrombus. Normal compressibility, respiratory phasicity and response to augmentation.  Calf Veins: No evidence of thrombus. Normal compressibility and flow on color Doppler imaging.  Superficial Great Saphenous Vein: No evidence of thrombus. Normal compressibility and flow on color Doppler imaging.  Venous Reflux:  None.  Other Findings:  None.  IMPRESSION: No evidence of deep venous thrombosis in the left lower extremity.   Electronically Signed   By: David  Martinique   On: 07/07/2013 21:18    MDM   Final diagnoses:  1. Atypical chest pain 2. Knee pain    Complains of pain in the left knee area. There was no history of injury. She has tenderness over the medial aspect of the knee without swelling or effusion. She did not have any significant calf tenderness or swelling. Because she has also been complaining of chest pain, however, DVT was considered. It was felt to be likely based on the exam. D-dimer was negative an ultrasound was negative for clot.  As his chest pain is extremely atypical. She has intermittent sharp pains that last for seconds at a time and then resolve. She has no cardiac risk factors. EKG was normal. X-ray was normal. Based on her presentation, no consideration for acute coronary syndrome. She is not currently experiencing chest pain. Based on her normal d-dimer and the atypical nature, is also told that she has very little or no likelihood of PE. Perc/Wells negative.  Patient reassured, rest and analgesia as needed. Followup with primary doctor as needed.  I personally performed the services described in this documentation, which was scribed in my presence. The recorded information has been reviewed and is accurate.       Orpah Greek, MD 07/07/13 2200

## 2013-07-07 NOTE — Discharge Instructions (Signed)
Chest Pain (Nonspecific) °It is often hard to give a specific diagnosis for the cause of chest pain. There is always a chance that your pain could be related to something serious, such as a heart attack or a blood clot in the lungs. You need to follow up with your caregiver for further evaluation. °CAUSES  °· Heartburn. °· Pneumonia or bronchitis. °· Anxiety or stress. °· Inflammation around your heart (pericarditis) or lung (pleuritis or pleurisy). °· A blood clot in the lung. °· A collapsed lung (pneumothorax). It can develop suddenly on its own (spontaneous pneumothorax) or from injury (trauma) to the chest. °· Shingles infection (herpes zoster virus). °The chest wall is composed of bones, muscles, and cartilage. Any of these can be the source of the pain. °· The bones can be bruised by injury. °· The muscles or cartilage can be strained by coughing or overwork. °· The cartilage can be affected by inflammation and become sore (costochondritis). °DIAGNOSIS  °Lab tests or other studies, such as X-rays, electrocardiography, stress testing, or cardiac imaging, may be needed to find the cause of your pain.  °TREATMENT  °· Treatment depends on what may be causing your chest pain. Treatment may include: °· Acid blockers for heartburn. °· Anti-inflammatory medicine. °· Pain medicine for inflammatory conditions. °· Antibiotics if an infection is present. °· You may be advised to change lifestyle habits. This includes stopping smoking and avoiding alcohol, caffeine, and chocolate. °· You may be advised to keep your head raised (elevated) when sleeping. This reduces the chance of acid going backward from your stomach into your esophagus. °· Most of the time, nonspecific chest pain will improve within 2 to 3 days with rest and mild pain medicine. °HOME CARE INSTRUCTIONS  °· If antibiotics were prescribed, take your antibiotics as directed. Finish them even if you start to feel better. °· For the next few days, avoid physical  activities that bring on chest pain. Continue physical activities as directed. °· Do not smoke. °· Avoid drinking alcohol. °· Only take over-the-counter or prescription medicine for pain, discomfort, or fever as directed by your caregiver. °· Follow your caregiver's suggestions for further testing if your chest pain does not go away. °· Keep any follow-up appointments you made. If you do not go to an appointment, you could develop lasting (chronic) problems with pain. If there is any problem keeping an appointment, you must call to reschedule. °SEEK MEDICAL CARE IF:  °· You think you are having problems from the medicine you are taking. Read your medicine instructions carefully. °· Your chest pain does not go away, even after treatment. °· You develop a rash with blisters on your chest. °SEEK IMMEDIATE MEDICAL CARE IF:  °· You have increased chest pain or pain that spreads to your arm, neck, jaw, back, or abdomen. °· You develop shortness of breath, an increasing cough, or you are coughing up blood. °· You have severe back or abdominal pain, feel nauseous, or vomit. °· You develop severe weakness, fainting, or chills. °· You have a fever. °THIS IS AN EMERGENCY. Do not wait to see if the pain will go away. Get medical help at once. Call your local emergency services (911 in [place] Do not drive yourself to the hospital. °MAKE SURE YOU:  °· Understand these instructions. °· Will watch your condition. °· Will get help right away if you are not doing well or get worse. °Document Released: 12/07/2004 Document Revised: 05/22/2011 Document Reviewed: 10/03/2007 °[employer]® Patient Information ©2014 [employer].  Arthralgia Your caregiver has diagnosed you as suffering from an arthralgia. Arthralgia means there is pain in a joint. This can come from many reasons including:  Bruising the joint which causes soreness (inflammation) in the joint.  Wear and tear on the joints which occur as we grow older  (osteoarthritis).  Overusing the joint.  Various forms of arthritis.  Infections of the joint. Regardless of the cause of pain in your joint, most of these different pains respond to anti-inflammatory drugs and rest. The exception to this is when a joint is infected, and these cases are treated with antibiotics, if it is a bacterial infection. HOME CARE INSTRUCTIONS   Rest the injured area for as long as directed by your caregiver. Then slowly start using the joint as directed by your caregiver and as the pain allows. Crutches as directed may be useful if the ankles, knees or hips are involved. If the knee was splinted or casted, continue use and care as directed. If an stretchy or elastic wrapping bandage has been applied today, it should be removed and re-applied every 3 to 4 hours. It should not be applied tightly, but firmly enough to keep swelling down. Watch toes and feet for swelling, bluish discoloration, coldness, numbness or excessive pain. If any of these problems (symptoms) occur, remove the ace bandage and re-apply more loosely. If these symptoms persist, contact your caregiver or return to this location.  For the first 24 hours, keep the injured extremity elevated on pillows while lying down.  Apply ice for 15-20 minutes to the sore joint every couple hours while awake for the first half day. Then 03-04 times per day for the first 48 hours. Put the ice in a plastic bag and place a towel between the bag of ice and your skin.  Wear any splinting, casting, elastic bandage applications, or slings as instructed.  Only take over-the-counter or prescription medicines for pain, discomfort, or fever as directed by your caregiver. Do not use aspirin immediately after the injury unless instructed by your physician. Aspirin can cause increased bleeding and bruising of the tissues.  If you were given crutches, continue to use them as instructed and do not resume weight bearing on the sore joint  until instructed. Persistent pain and inability to use the sore joint as directed for more than 2 to 3 days are warning signs indicating that you should see a caregiver for a follow-up visit as soon as possible. Initially, a hairline fracture (break in bone) may not be evident on X-rays. Persistent pain and swelling indicate that further evaluation, non-weight bearing or use of the joint (use of crutches or slings as instructed), or further X-rays are indicated. X-rays may sometimes not show a small fracture until a week or 10 days later. Make a follow-up appointment with your own caregiver or one to whom we have referred you. A radiologist (specialist in reading X-rays) may read your X-rays. Make sure you know how you are to obtain your X-ray results. Do not assume everything is normal if you do not hear from [place]. SEEK MEDICAL CARE IF: Bruising, swelling, or pain increases. SEEK IMMEDIATE MEDICAL CARE IF:   Your fingers or toes are numb or blue.  The pain is not responding to medications and continues to stay the same or get worse.  The pain in your joint becomes severe.  You develop a fever over 102 F (38.9 C).  It becomes impossible to move or use the joint. MAKE  SURE YOU:   Understand these instructions.  Will watch your condition.  Will get help right away if you are not doing well or get worse. Document Released: 02/27/2005 Document Revised: 05/22/2011 Document Reviewed: 10/16/2007 [facility] Patient Information 2014 [place].

## 2013-07-07 NOTE — ED Notes (Signed)
Pain left knee and lower leg. States 2 days ago she had pain in her left upper quadrant. No SOB. No injury.

## 2013-09-26 ENCOUNTER — Telehealth: Payer: Self-pay | Admitting: Nurse Practitioner

## 2013-09-26 ENCOUNTER — Ambulatory Visit (HOSPITAL_BASED_OUTPATIENT_CLINIC_OR_DEPARTMENT_OTHER)
Admission: RE | Admit: 2013-09-26 | Discharge: 2013-09-26 | Disposition: A | Discharge status: Home / Self Care | Payer: BC Managed Care – PPO | Source: Ambulatory Visit | Attending: Nurse Practitioner | Admitting: Nurse Practitioner

## 2013-09-26 ENCOUNTER — Ambulatory Visit (INDEPENDENT_AMBULATORY_CARE_PROVIDER_SITE_OTHER): Payer: BC Managed Care – PPO | Admitting: Nurse Practitioner

## 2013-09-26 ENCOUNTER — Telehealth: Payer: Self-pay | Admitting: Family Medicine

## 2013-09-26 ENCOUNTER — Encounter: Payer: Self-pay | Admitting: Nurse Practitioner

## 2013-09-26 VITALS — BP 121/78 | HR 83 | Temp 98.5°F | Ht 64.0 in | Wt 167.0 lb

## 2013-09-26 DIAGNOSIS — R1030 Lower abdominal pain, unspecified: Secondary | ICD-10-CM

## 2013-09-26 DIAGNOSIS — R102 Pelvic and perineal pain: Secondary | ICD-10-CM | POA: Insufficient documentation

## 2013-09-26 DIAGNOSIS — N949 Unspecified condition associated with female genital organs and menstrual cycle: Secondary | ICD-10-CM | POA: Insufficient documentation

## 2013-09-26 DIAGNOSIS — R109 Unspecified abdominal pain: Secondary | ICD-10-CM

## 2013-09-26 LAB — POCT URINALYSIS DIPSTICK
BILIRUBIN UA: NEGATIVE
Glucose, UA: NEGATIVE
KETONES UA: NEGATIVE
Leukocytes, UA: NEGATIVE
Nitrite, UA: NEGATIVE
PH UA: 7
Protein, UA: NEGATIVE
RBC UA: NEGATIVE
SPEC GRAV UA: 1.025
Urobilinogen, UA: 0.2

## 2013-09-26 LAB — POCT URINE PREGNANCY: Preg Test, Ur: NEGATIVE

## 2013-09-26 MED ORDER — SULFAMETHOXAZOLE-TMP DS 800-160 MG PO TABS: 1.0000 | ORAL_TABLET | Freq: Two times a day (BID) | ORAL | Status: DC

## 2013-09-26 NOTE — Progress Notes (Signed)
   Subjective:    Patient ID: Jaclyn Tucker, female    DOB: 04-16-1975, 38 y.o.   MRN: [id number]  Pelvic Pain The patient's primary symptoms include pelvic pain. The patient's pertinent negatives include no genital itching, genital lesions, genital odor, genital rash, missed menses, vaginal bleeding or vaginal discharge. This is a new problem. The current episode started 1 to 4 weeks ago (2wks). The problem occurs constantly. The problem has been gradually worsening. The pain is moderate. The problem affects both (R>L) sides. She is not pregnant. Associated symptoms include abdominal pain, frequency (resolved) and urgency (resolved). Pertinent negatives include no back pain, chills, constipation, diarrhea, discolored urine, dysuria, fever, flank pain, headaches, hematuria, nausea or vomiting. Nothing aggravates the symptoms. She has tried NSAIDs for the symptoms. The treatment provided no relief. She is sexually active. No, her partner does not have an STD. She uses vasectomy for contraception. Her menstrual history has been regular.      Review of Systems  Constitutional: Negative for fever, chills and fatigue.  Gastrointestinal: Positive for abdominal pain. Negative for nausea, vomiting, diarrhea and constipation.  Genitourinary: Positive for urgency (resolved), frequency (resolved) and pelvic pain. Negative for dysuria, hematuria, flank pain, vaginal discharge and missed menses.  Musculoskeletal: Negative for back pain.  Neurological: Negative for headaches.       Objective:   Physical Exam  Vitals reviewed. Constitutional: She is oriented to person, place, and time. She appears well-developed and well-nourished. No distress.  HENT:  Head: Normocephalic and atraumatic.  Eyes: Conjunctivae are normal. Pupils are equal, round, and reactive to light. Right eye exhibits no discharge. Left eye exhibits no discharge.  Neck: Normal range of motion. Neck supple. No thyromegaly present.    Cardiovascular: Normal rate, regular rhythm and normal heart sounds.   No murmur heard. Pulmonary/Chest: Effort normal and breath sounds normal. No respiratory distress. She has no wheezes. She has no rales.  Abdominal: Soft. Bowel sounds are normal. She exhibits no distension and no mass. There is tenderness (suprapubic tender to palpation.). There is no rebound and no guarding.  Lymphadenopathy:    She has no cervical adenopathy.  Neurological: She is alert and oriented to person, place, and time.  Skin: Skin is warm and dry.  Psychiatric: She has a normal mood and affect. Her behavior is normal. Thought content normal.          Assessment & Plan:  1. Suprapubic discomfort, unspecified laterality 2 wks. - US Transvaginal Non-OB; Future - US Pelvis Complete; Future - POCT urine pregnancy-neg - POCT urinalysis dipstick-nml - Urine culture

## 2013-09-26 NOTE — Telephone Encounter (Signed)
pls call pt: Advise Pelvic & transvaginal US normal-no ovarian cysts or ectopic preg. Discomfort may be UTI, will prescribe  ABX, start today.  Schedule follow up appt. Early next week.

## 2013-09-26 NOTE — Telephone Encounter (Signed)
Patient notified of results. Patient will call office next week to schedule f/u appt.

## 2013-09-26 NOTE — Assessment & Plan Note (Signed)
2 wks suprapubic discomfort, R>L. No constipation or diarrhea. MC last week. Dysuria 2 wks ago, resolved. poc preg-neg, culutre pending. Pelvic & transvag US today. DD: ectopic preg, UTI, ovarian cyst, diverticulitis, appendicitis Pt declined toradol IM.

## 2013-09-26 NOTE — Telephone Encounter (Signed)
A user error has taken place.

## 2013-09-26 NOTE — Patient Instructions (Signed)
Continue ibuprophen as needed. Take with food.  We will call with lab & [place] results.

## 2013-09-26 NOTE — Progress Notes (Signed)
Pre visit review using our clinic review tool, if applicable. No additional management support is needed unless otherwise documented below in the visit note. 

## 2013-09-27 LAB — URINE CULTURE: Colony Count: 2000

## 2013-10-06 ENCOUNTER — Ambulatory Visit (INDEPENDENT_AMBULATORY_CARE_PROVIDER_SITE_OTHER): Payer: BC Managed Care – PPO | Admitting: Family Medicine

## 2013-10-06 ENCOUNTER — Encounter: Payer: Self-pay | Admitting: Family Medicine

## 2013-10-06 VITALS — BP 122/80 | HR 90 | Temp 98.2°F | Resp 16 | Wt 167.1 lb

## 2013-10-06 DIAGNOSIS — R102 Pelvic and perineal pain: Secondary | ICD-10-CM

## 2013-10-06 DIAGNOSIS — N949 Unspecified condition associated with female genital organs and menstrual cycle: Secondary | ICD-10-CM

## 2013-10-06 LAB — BASIC METABOLIC PANEL
BUN: 9 mg/dL (ref 6–23)
CHLORIDE: 107 meq/L (ref 96–112)
CO2: 27 mEq/L (ref 19–32)
Calcium: 9.1 mg/dL (ref 8.4–10.5)
Creatinine, Ser: 0.9 mg/dL (ref 0.4–1.2)
GFR: 79.52 mL/min (ref >60.00)
GLUCOSE: 105 mg/dL — AB (ref 70–99)
POTASSIUM: 3.8 meq/L (ref 3.5–5.1)
Sodium: 138 mEq/L (ref 135–145)

## 2013-10-06 LAB — SEDIMENTATION RATE: SED RATE: 2 mm/h (ref 0–22)

## 2013-10-06 LAB — CBC WITH DIFFERENTIAL/PLATELET
Basophils Absolute: 0 10*3/uL (ref 0.0–0.1)
Basophils Relative: 0.3 % (ref 0.0–3.0)
EOS PCT: 1.8 % (ref 0.0–5.0)
Eosinophils Absolute: 0.1 10*3/uL (ref 0.0–0.7)
HCT: 41.6 % (ref 36.0–46.0)
Hemoglobin: 14 g/dL (ref 12.0–15.0)
Lymphocytes Relative: 33 % (ref 12.0–46.0)
Lymphs Abs: 2.1 10*3/uL (ref 0.7–4.0)
MCHC: 33.7 g/dL (ref 30.0–36.0)
MCV: 88.2 fl (ref 78.0–100.0)
MONO ABS: 0.3 10*3/uL (ref 0.1–1.0)
Monocytes Relative: 5.5 % (ref 3.0–12.0)
NEUTROS PCT: 59.4 % (ref 43.0–77.0)
Neutro Abs: 3.7 10*3/uL (ref 1.4–7.7)
Platelets: 285 10*3/uL (ref 150.0–400.0)
RBC: 4.72 Mil/uL (ref 3.87–5.11)
RDW: 13.2 % (ref 11.5–15.5)
WBC: 6.3 10*3/uL (ref 4.0–10.5)

## 2013-10-06 LAB — HEPATIC FUNCTION PANEL
ALBUMIN: 4.1 g/dL (ref 3.5–5.2)
ALT: 12 U/L (ref 0–35)
AST: 18 U/L (ref 0–37)
Alkaline Phosphatase: 57 U/L (ref 39–117)
Bilirubin, Direct: 0 mg/dL (ref 0.0–0.3)
Total Bilirubin: 0.3 mg/dL (ref 0.2–1.2)
Total Protein: 7.7 g/dL (ref 6.0–8.3)

## 2013-10-06 MED ORDER — CYCLOBENZAPRINE HCL 10 MG PO TABS: 10.0000 mg | ORAL_TABLET | Freq: Three times a day (TID) | ORAL | Status: DC | PRN

## 2013-10-06 MED ORDER — MELOXICAM 15 MG PO TABS: 15.0000 mg | ORAL_TABLET | Freq: Every day | ORAL | Status: DC

## 2013-10-06 NOTE — Progress Notes (Signed)
Pre visit review using our clinic review tool, if applicable. No additional management support is needed unless otherwise documented below in the visit note. 

## 2013-10-06 NOTE — Assessment & Plan Note (Signed)
New.  Reviewed pelvic US results w/ pt- has upcoming GYN appt.  Discussed that her sxs may be due to pain and inflammation in her back which is radiating into pelvis, rather than pelvic pain radiating to back.  Pt is very concerned about possibility of ovarian cancer due to google search of sxs.  Will get CA125 and other labs to r/o causes of pelvic pain.  Start daily NSAID, flexeril prn.  Reviewed supportive care and red flags that should prompt return.  Pt expressed understanding and is in agreement w/ plan.

## 2013-10-06 NOTE — Patient Instructions (Signed)
Follow up w/ GYN as scheduled We'll notify you of your lab results and make any changes if needed Start the Mobic once daily- take w/ food- for inflammation Use the flexeril at night for spasm and pain Drink plenty of fluids REST! Call with any questions or concerns We'll figure this out!!!

## 2013-10-06 NOTE — Progress Notes (Signed)
   Subjective:    Patient ID: Jaclyn Tucker, female    DOB: 08/20/1975, 38 y.o.   MRN: [id number]  HPI Pelvic pain- sxs started ~1 month ago.  Bilateral.  Will radiate into lower back.  Has sensation of bloating and fullness.  No N/V, fever.  No pain w/ urination, frequency, urgency.  Rare hesitancy just prior to menses.  No pain w/ sex.  No known musculoskeletal injury, no change in activity (including heavy lifting) due for menses 1st week of August.  Had pelvic US done 10 days ago.  Sees GYN (Dr Harrington Challenger)- tried to get an appt all month and is scheduled for Thursday   Review of Systems For ROS see HPI     Objective:   Physical Exam  Vitals reviewed. Constitutional: She appears well-developed and well-nourished. She appears distressed (tearful, anxious).  HENT:  Head: Normocephalic and atraumatic.  Cardiovascular: Normal rate, regular rhythm and normal heart sounds.   Pulmonary/Chest: Effort normal and breath sounds normal. No respiratory distress. She has no wheezes. She has no rales.  Abdominal: Soft. Bowel sounds are normal. She exhibits no distension and no mass. There is tenderness (mild TTP medial to iliac crests bilaterally). There is no rebound and no guarding.  Genitourinary:  Deferred to GYN- pelvic US reviewed  Musculoskeletal:  + TTP over lumbar paraspinal muscles bilaterally- no CVA tenderness  Neurological: She has normal reflexes. Coordination normal.  (-) SLR bilaterally  Psychiatric:  anxious          Assessment & Plan:

## 2013-10-07 LAB — CA 125: CA 125: 18.7 U/mL (ref 0.0–30.2)

## 2013-11-12 ENCOUNTER — Encounter: Payer: Self-pay | Admitting: Medical

## 2013-11-12 ENCOUNTER — Ambulatory Visit (INDEPENDENT_AMBULATORY_CARE_PROVIDER_SITE_OTHER): Payer: BC Managed Care – PPO | Admitting: Medical

## 2013-11-12 VITALS — BP 130/84 | HR 87 | Temp 98.3°F | Ht 63.5 in | Wt 165.2 lb

## 2013-11-12 DIAGNOSIS — L218 Other seborrheic dermatitis: Secondary | ICD-10-CM

## 2013-11-12 DIAGNOSIS — L219 Seborrheic dermatitis, unspecified: Secondary | ICD-10-CM

## 2013-11-12 MED ORDER — KETOCONAZOLE 2 % EX SHAM: 1.0000 "application " | MEDICATED_SHAMPOO | CUTANEOUS | Status: DC

## 2013-11-12 NOTE — Progress Notes (Signed)
   Subjective:    Patient ID: Jaclyn Tucker, female    DOB: Nov 17, 1975, 38 y.o.   MRN: [id number]  HPI  Pt in with with mostly scalp itching. She states almost feels like something crawling in her scalp. She bought a nit come and could not find anything. She did find some mild dandruff and tried some otc shampoo but did not help. Pt has 2 children in school One child 23 yo. Pt had niece in July who had lice. Pt son never got lice. Pt wonders if she is very nervous about lice. LMP- presently. Pt went to urgent care and they only gave antihistamine/hydroxyzine. Did not help but made her sleepy.  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    Review of Systems  Constitutional: Negative for fever, chills and fatigue.  HENT: Negative.   Respiratory: Negative for cough, choking and wheezing.   Cardiovascular: Negative for chest pain and palpitations.  Skin:       Pt feels very itchy scalp.  Psychiatric/Behavioral: Negative for behavioral problems, dysphoric mood, decreased concentration and agitation. The patient is nervous/anxious.        Very nervous/anxious over the possibility of lice.       Objective:   Physical Exam  Dermatologic- on inspection of her scalp I do not see any definite lice. Mild scattered dandruff. Scalp itself did not appear red.(Dr. Birdie Riddle evaluated her as well)       Assessment & Plan:

## 2013-11-12 NOTE — Assessment & Plan Note (Signed)
This is possible diagnosis but not 100% conclusive. Will treat with ketoconazole shampoo. Note no lice seen but this is obvious cause of extreme  concern for pt. I offered treatment for lice and ketoconazole as back up initially if her symptoms persisted(as dermatitis in differential). Pt did not want treatment for lice unless we were absolute sure of lice diagnosis.

## 2013-11-12 NOTE — Patient Instructions (Addendum)
We want you to take ketoconazole shampoo for seborrheic dermatitis of scalp. Use this twice a week for one month. If your symptoms persist, worsen or change let us know. We could see you again or refer to dermatologist. You have no definite evidence of lice. Follow up in 1 month or as needed.  Seborrheic Dermatitis Seborrheic dermatitis involves pink or red skin with greasy, flaky scales. This is often found on the scalp, eyebrows, nose, bearded area, and on or behind the ears. It can also occur on the central chest. It often occurs where there are more oil (sebaceous) glands. This condition is also known as dandruff. When this condition affects a baby's scalp, it is called cradle cap. It may come and go for no known reason. It can occur at any time of life from infancy to old age. CAUSES  The cause is unknown. It is not the result of too little moisture or too much oil. In some people, seborrheic dermatitis flare-ups seem to be triggered by stress. It also commonly occurs in people with certain diseases such as Parkinson's disease or HIV/AIDS. SYMPTOMS   Thick scales on the scalp.  Redness on the face or in the armpits.  The skin may seem oily or dry, but moisturizers do not help.  In infants, seborrheic dermatitis appears as scaly redness that does not seem to bother the baby. In some babies, it affects only the scalp. In others, it also affects the neck creases, armpits, groin, or behind the ears.  In adults and adolescents, seborrheic dermatitis may affect only the scalp. It may look patchy or spread out, with areas of redness and flaking. Other areas commonly affected include:  Eyebrows.  Eyelids.  Forehead.  Skin behind the ears.  Outer ears.  Chest.  Armpits.  Nose creases.  Skin creases under the breasts.  Skin between the buttocks.  Groin.  Some adults and adolescents feel itching or burning in the affected areas. DIAGNOSIS  Your caregiver can usually tell what the  problem is by doing a physical exam. TREATMENT   Cortisone (steroid) ointments, creams, and lotions can help decrease inflammation.  Babies can be treated with baby oil to soften the scales, then they may be washed with baby shampoo. If this does not help, a prescription topical steroid medicine may work.  Adults can use medicated shampoos.  Your caregiver may prescribe corticosteroid cream and shampoo containing an antifungal or yeast medicine (ketoconazole). Hydrocortisone or anti-yeast cream can be rubbed directly onto seborrheic dermatitis patches. Yeast does not cause seborrheic dermatitis, but it seems to add to the problem. In infants, seborrheic dermatitis is often worst during the first year of life. It tends to disappear on its own as the child grows. However, it may return during the teenage years. In adults and adolescents, seborrheic dermatitis tends to be a long-lasting condition that comes and goes over many years. HOME CARE INSTRUCTIONS   Use prescribed medicines as directed.  In infants, do not aggressively remove the scales or flakes on the scalp with a comb or by other means. This may lead to hair loss. SEEK MEDICAL CARE IF:   The problem does not improve from the medicated shampoos, lotions, or other medicines given by your caregiver.  You have any other questions or concerns. Document Released: 02/27/2005 Document Revised: 08/29/2011 Document Reviewed: 07/19/2009 [facility] Patient Information 2015 [place], [place]. This information is not intended to replace advice given to you by your health care provider. Make sure you discuss  any questions you have with your health care provider.  worsen or change notify us. We could see you again or we could refer to dermatologist. We don't find any definite evidence of lice today. Follow up in 1 month or as needed.

## 2014-01-19 ENCOUNTER — Encounter: Payer: Self-pay | Admitting: Family Medicine

## 2014-01-19 ENCOUNTER — Ambulatory Visit (INDEPENDENT_AMBULATORY_CARE_PROVIDER_SITE_OTHER): Payer: BC Managed Care – PPO | Admitting: Family Medicine

## 2014-01-19 VITALS — BP 120/86 | HR 93 | Temp 97.9°F | Resp 16 | Wt 171.5 lb

## 2014-01-19 DIAGNOSIS — R5383 Other fatigue: Secondary | ICD-10-CM | POA: Insufficient documentation

## 2014-01-19 DIAGNOSIS — D518 Other vitamin B12 deficiency anemias: Secondary | ICD-10-CM

## 2014-01-19 LAB — CBC WITH DIFFERENTIAL/PLATELET
BASOS ABS: 0 10*3/uL (ref 0.0–0.1)
Basophils Relative: 0.5 % (ref 0.0–3.0)
Eosinophils Absolute: 0.1 10*3/uL (ref 0.0–0.7)
Eosinophils Relative: 1.5 % (ref 0.0–5.0)
HCT: 43 % (ref 36.0–46.0)
Hemoglobin: 14.2 g/dL (ref 12.0–15.0)
LYMPHS ABS: 2.3 10*3/uL (ref 0.7–4.0)
LYMPHS PCT: 30.8 % (ref 12.0–46.0)
MCHC: 33 g/dL (ref 30.0–36.0)
MCV: 87.6 fl (ref 78.0–100.0)
MONOS PCT: 6.4 % (ref 3.0–12.0)
Monocytes Absolute: 0.5 10*3/uL (ref 0.1–1.0)
Neutro Abs: 4.5 10*3/uL (ref 1.4–7.7)
Neutrophils Relative %: 60.8 % (ref 43.0–77.0)
Platelets: 306 10*3/uL (ref 150.0–400.0)
RBC: 4.9 Mil/uL (ref 3.87–5.11)
RDW: 12.9 % (ref 11.5–15.5)
WBC: 7.4 10*3/uL (ref 4.0–10.5)

## 2014-01-19 LAB — TSH: TSH: 3.57 u[IU]/mL (ref 0.35–4.50)

## 2014-01-19 LAB — BASIC METABOLIC PANEL
BUN: 10 mg/dL (ref 6–23)
CO2: 25 mEq/L (ref 19–32)
CREATININE: 0.8 mg/dL (ref 0.4–1.2)
Calcium: 9.4 mg/dL (ref 8.4–10.5)
Chloride: 104 mEq/L (ref 96–112)
GFR: 81.57 mL/min (ref >60.00)
Glucose, Bld: 83 mg/dL (ref 70–99)
Potassium: 3.9 mEq/L (ref 3.5–5.1)
Sodium: 138 mEq/L (ref 135–145)

## 2014-01-19 LAB — VITAMIN B12: Vitamin B-12: 317 pg/mL (ref 211–911)

## 2014-01-19 NOTE — Assessment & Plan Note (Signed)
Chronic problem for pt.  Has been off Vit B supplementation x11 months when the initial plan was 3 months.  Check labs and determine if injxns are again required.  Pt expressed understanding and is in agreement w/ plan.

## 2014-01-19 NOTE — Progress Notes (Signed)
   Subjective:    Patient ID: Brittie Whisnant, female    DOB: November 06, 1975, 38 y.o.   MRN: [id number]  HPI B12 deficiency- pt reports sxs are consistent w/ previous B12 deficiency.  Increased anxiety, muscle fatigue, generalized fatigue, weight gain, HAs, tingling of LEs.  Was previously getting B12 injxns but these stopped in January after level was high.  Pt has not been on any supplementation since.   Review of Systems For ROS see HPI     Objective:   Physical Exam  Constitutional: She is oriented to person, place, and time. She appears well-developed and well-nourished. No distress.  HENT:  Head: Normocephalic and atraumatic.  Eyes: Conjunctivae and EOM are normal. Pupils are equal, round, and reactive to light.  Neck: Normal range of motion. Neck supple. No thyromegaly present.  Cardiovascular: Normal rate, regular rhythm, normal heart sounds and intact distal pulses.   No murmur heard. Pulmonary/Chest: Effort normal and breath sounds normal. No respiratory distress.  Abdominal: Soft. She exhibits no distension. There is no tenderness.  Musculoskeletal: She exhibits no edema.  Lymphadenopathy:    She has no cervical adenopathy.  Neurological: She is alert and oriented to person, place, and time.  Skin: Skin is warm and dry.  Psychiatric: She has a normal mood and affect. Her behavior is normal.  Vitals reviewed.         Assessment & Plan:

## 2014-01-19 NOTE — Patient Instructions (Signed)
Schedule your complete physical at your convenience We'll notify you of your lab results and make any changes if needed Try and make healthy food choices and get regular exercise- this will help to improve fatigue Call with any questions or concerns Hang in there!!!

## 2014-01-19 NOTE — Assessment & Plan Note (Signed)
New.  Suspect this is due to pt's hx of B12 deficiency but will also r/o thyroid abnormality, anemia, electrolyte disturbance.  Will follow.

## 2014-01-19 NOTE — Progress Notes (Signed)
Pre visit review using our clinic review tool, if applicable. No additional management support is needed unless otherwise documented below in the visit note. 

## 2014-01-23 ENCOUNTER — Ambulatory Visit: Payer: BC Managed Care – PPO | Admitting: Family Medicine

## 2014-03-09 ENCOUNTER — Encounter (HOSPITAL_BASED_OUTPATIENT_CLINIC_OR_DEPARTMENT_OTHER): Payer: Self-pay

## 2014-03-09 ENCOUNTER — Emergency Department (HOSPITAL_BASED_OUTPATIENT_CLINIC_OR_DEPARTMENT_OTHER)
Admission: EM | Admit: 2014-03-09 | Discharge: 2014-03-09 | Disposition: A | Discharge status: Home / Self Care | Payer: BC Managed Care – PPO | Attending: Emergency Medicine | Admitting: Emergency Medicine

## 2014-03-09 DIAGNOSIS — G43909 Migraine, unspecified, not intractable, without status migrainosus: Secondary | ICD-10-CM | POA: Insufficient documentation

## 2014-03-09 DIAGNOSIS — Z3202 Encounter for pregnancy test, result negative: Secondary | ICD-10-CM | POA: Insufficient documentation

## 2014-03-09 DIAGNOSIS — G43009 Migraine without aura, not intractable, without status migrainosus: Secondary | ICD-10-CM

## 2014-03-09 DIAGNOSIS — Z791 Long term (current) use of non-steroidal anti-inflammatories (NSAID): Secondary | ICD-10-CM | POA: Insufficient documentation

## 2014-03-09 DIAGNOSIS — F419 Anxiety disorder, unspecified: Secondary | ICD-10-CM | POA: Diagnosis not present

## 2014-03-09 DIAGNOSIS — Z880 Allergy status to penicillin: Secondary | ICD-10-CM | POA: Insufficient documentation

## 2014-03-09 DIAGNOSIS — Z720 Tobacco use: Secondary | ICD-10-CM | POA: Insufficient documentation

## 2014-03-09 DIAGNOSIS — R51 Headache: Secondary | ICD-10-CM | POA: Diagnosis present

## 2014-03-09 LAB — URINALYSIS, ROUTINE W REFLEX MICROSCOPIC
Bilirubin Urine: NEGATIVE
Glucose, UA: NEGATIVE mg/dL
Hgb urine dipstick: NEGATIVE
Ketones, ur: NEGATIVE mg/dL
Leukocytes, UA: NEGATIVE
NITRITE: NEGATIVE
Protein, ur: NEGATIVE mg/dL
Specific Gravity, Urine: 1.01 (ref 1.005–1.030)
UROBILINOGEN UA: 0.2 mg/dL (ref 0.0–1.0)
pH: 5.5 (ref 5.0–8.0)

## 2014-03-09 LAB — CBC WITH DIFFERENTIAL/PLATELET
BASOS PCT: 0 % (ref 0–1)
Basophils Absolute: 0 10*3/uL (ref 0.0–0.1)
Eosinophils Absolute: 0.1 10*3/uL (ref 0.0–0.7)
Eosinophils Relative: 2 % (ref 0–5)
HCT: 40.1 % (ref 36.0–46.0)
HEMOGLOBIN: 13.7 g/dL (ref 12.0–15.0)
Lymphocytes Relative: 35 % (ref 12–46)
Lymphs Abs: 2.5 10*3/uL (ref 0.7–4.0)
MCH: 29.8 pg (ref 26.0–34.0)
MCHC: 34.2 g/dL (ref 30.0–36.0)
MCV: 87.4 fL (ref 78.0–100.0)
MONOS PCT: 7 % (ref 3–12)
Monocytes Absolute: 0.5 10*3/uL (ref 0.1–1.0)
NEUTROS ABS: 4 10*3/uL (ref 1.7–7.7)
NEUTROS PCT: 56 % (ref 43–77)
Platelets: 326 10*3/uL (ref 150–400)
RBC: 4.59 MIL/uL (ref 3.87–5.11)
RDW: 12.6 % (ref 11.5–15.5)
WBC: 7.1 10*3/uL (ref 4.0–10.5)

## 2014-03-09 LAB — BASIC METABOLIC PANEL
ANION GAP: 8 (ref 5–15)
BUN: 10 mg/dL (ref 6–23)
CHLORIDE: 106 meq/L (ref 96–112)
CO2: 25 mmol/L (ref 19–32)
Calcium: 9 mg/dL (ref 8.4–10.5)
Creatinine, Ser: 0.84 mg/dL (ref 0.50–1.10)
GFR calc non Af Amer: 88 mL/min — ABNORMAL LOW (ref >90)
Glucose, Bld: 90 mg/dL (ref 70–99)
POTASSIUM: 4.2 mmol/L (ref 3.5–5.1)
SODIUM: 139 mmol/L (ref 135–145)

## 2014-03-09 LAB — PREGNANCY, URINE: PREG TEST UR: NEGATIVE

## 2014-03-09 MED ORDER — METOCLOPRAMIDE HCL 5 MG/ML IJ SOLN
10.0000 mg | Freq: Once | INTRAMUSCULAR | Status: AC
  Administered 2014-03-09: 10 mg via INTRAVENOUS
  Filled 2014-03-09: qty 2

## 2014-03-09 MED ORDER — LORAZEPAM 2 MG/ML IJ SOLN
1.0000 mg | Freq: Once | INTRAMUSCULAR | Status: AC
  Administered 2014-03-09: 1 mg via INTRAVENOUS
  Filled 2014-03-09: qty 1

## 2014-03-09 MED ORDER — DIPHENHYDRAMINE HCL 50 MG/ML IJ SOLN
25.0000 mg | Freq: Once | INTRAMUSCULAR | Status: AC
  Administered 2014-03-09: 25 mg via INTRAVENOUS
  Filled 2014-03-09: qty 1

## 2014-03-09 MED ORDER — PROCHLORPERAZINE EDISYLATE 5 MG/ML IJ SOLN
10.0000 mg | Freq: Once | INTRAMUSCULAR | Status: DC
  Filled 2014-03-09: qty 2

## 2014-03-09 MED ORDER — KETOROLAC TROMETHAMINE 30 MG/ML IJ SOLN
30.0000 mg | Freq: Once | INTRAMUSCULAR | Status: AC
  Administered 2014-03-09: 30 mg via INTRAVENOUS
  Filled 2014-03-09: qty 1

## 2014-03-09 MED ORDER — SODIUM CHLORIDE 0.9 % IV BOLUS (SEPSIS)
1000.0000 mL | Freq: Once | INTRAVENOUS | Status: AC
  Administered 2014-03-09: 1000 mL via INTRAVENOUS

## 2014-03-09 NOTE — ED Provider Notes (Signed)
CSN: [id number]     Arrival date & time 03/09/14  1630 History   First MD Initiated Contact with Patient 03/09/14 1727     Chief Complaint  Patient presents with  . Headache     (Consider location/radiation/quality/duration/timing/severity/associated sxs/prior Treatment) Patient is a 38 y.o. female presenting with headaches.  Headache Associated symptoms: no abdominal pain, no dizziness, no fever, no nausea, no neck pain, no numbness and no vomiting    [name] is a 38 year old female with past medical history of anxiety, migraines who presents the ER with complaint of left-sided headache, lightheadedness, intermittent numbness and paresthesias to upper extremities. She states her episode began gradually around 11 AM this morning, and persisted throughout the day today. Patient states that beginning of the episode she felt her symptoms were similar to one of her previous panic attacks, however states she is never experienced the lightheadedness and tingling sensations in her arm in the past. Patient states she's had migraines in the past, and headaches consistent with one she is having today. Patient states she takes gabapentin on a regular basis to help with her headaches. Patient denies any change or new associated signs or symptoms with her headache. Patient denies chest pain, shortness of, nausea, vomiting, visual disturbance, blurred vision, dizziness, abdominal pain, dysuria. Patient states last menstrual period was last week, she is currently sexually active with her husband who has had a vasectomy. Patient states her symptoms have began resolving around 4 PM this afternoon, and right now the ER she is still feeling mild symptoms, however they've mostly subsided.  Past Medical History  Diagnosis Date  . Anxiety   . Migraine    Past Surgical History  Procedure Laterality Date  . G2 p2     Family History  Problem Relation Age of Onset  . Diabetes Mother   . Hypertension  Father   . Colon cancer Paternal Grandmother   . Cancer Paternal Grandmother     colon   History  Substance Use Topics  . Smoking status: Current Some Day Smoker    Types: Cigarettes    Last Attempt to Quit: 04/09/2010  . Smokeless tobacco: Not on file     Comment: 5cigs/day  . Alcohol Use: Yes   OB History    No data available     Review of Systems  Constitutional: Negative for fever.  HENT: Negative for trouble swallowing.   Eyes: Negative for visual disturbance.  Respiratory: Negative for shortness of breath.   Cardiovascular: Negative for chest pain.  Gastrointestinal: Negative for nausea, vomiting and abdominal pain.  Genitourinary: Negative for dysuria.  Musculoskeletal: Negative for neck pain.  Skin: Negative for rash.  Neurological: Positive for headaches. Negative for dizziness, weakness and numbness.       Paresthesias  Psychiatric/Behavioral: Negative.       Allergies  Penicillins; Sulfate; Moxifloxacin; and Sumatriptan succinate  Home Medications   Prior to Admission medications   Medication Sig Start Date End Date Taking? Authorizing Provider  ALPRAZolam (XANAX PO) Take by mouth.    Historical Provider, MD  gabapentin (NEURONTIN) 100 MG capsule Take 1 capsule (100 mg total) by mouth as directed. One every 8 hours as needed 01/13/11   Hendricks Limes, MD  ibuprofen (ADVIL,MOTRIN) 800 MG tablet Take 1 tablet (800 mg total) by mouth 3 (three) times daily. 07/07/13   Orpah Greek, MD   BP 111/76 mmHg  Pulse 76  Temp(Src) 98.3 F (36.8 C) (Oral)  Resp 20  Ht 5\' 5"  (1.651 m)  Wt 170 lb (77.111 kg)  BMI 28.29 kg/m2  SpO2 100%  LMP 03/04/2014 Physical Exam  Constitutional: She is oriented to person, place, and time. She appears well-developed and well-nourished. No distress.  HENT:  Head: Normocephalic and atraumatic.  Mouth/Throat: Oropharynx is clear and moist. No oropharyngeal exudate.  Eyes: Right eye exhibits no discharge. Left eye  exhibits no discharge. No scleral icterus.  Neck: Normal range of motion.  Cardiovascular: Normal rate, regular rhythm and normal heart sounds.   No murmur heard. Pulmonary/Chest: Effort normal and breath sounds normal. No respiratory distress.  Abdominal: Soft. There is no tenderness.  Musculoskeletal: Normal range of motion. She exhibits no edema or tenderness.  Neurological: She is alert and oriented to person, place, and time. She has normal strength. No cranial nerve deficit or sensory deficit. Coordination normal. GCS eye subscore is 4. GCS verbal subscore is 5. GCS motor subscore is 6.  Patient fully alert answer questions appropriately in full, clear sentences. Cranial nerves II-12 grossly intact. Her strength 5 out of 5 in all major muscle groups of upper and lower extremity. Distal sensation intact.  Skin: Skin is warm and dry. No rash noted. She is not diaphoretic.  Psychiatric: She has a normal mood and affect.    ED Course  Procedures (including critical care time) Labs Review Labs Reviewed  BASIC METABOLIC PANEL - Abnormal; Notable for the following:    GFR calc non Af Amer 88 (*)    All other components within normal limits  CBC WITH DIFFERENTIAL  URINALYSIS, ROUTINE W REFLEX MICROSCOPIC  PREGNANCY, URINE    Imaging Review No results found.   EKG Interpretation   Date/Time:  Monday March 09 2014 16:39:04 EST Ventricular Rate:  70 PR Interval:  108 QRS Duration: 76 QT Interval:  400 QTC Calculation: 432 R Axis:   29 Text Interpretation:  Sinus rhythm with sinus arrhythmia with short PR  Otherwise normal ECG Confirmed by BEATON  MD, ROBERT ([id number]) on 03/09/2014  4:40:28 PM      MDM   Final diagnoses:  Nonintractable migraine, unspecified migraine type   Patient reports headache today, and some lightheadedness and paresthesias. Patient reporting symptoms consistent with previous episodes of migraine and anxiety. Patient with one episode of anxiousness  in the ER. Patient's headache treated, anxiety treated.   On reexamination, patient is asymptomatic, well-appearing, nontachypneic, non-tachycardic, non-hypoxic and in no acute distress. Pt HA treated and improved while in ED.  Presentation is like pts typical HA and non concerning for [facility], ICH, Meningitis, or temporal arteritis. Pt is afebrile with no focal neuro deficits, nuchal rigidity, or change in vision. Pt is to follow up with PCP to discuss prophylactic medication. Pt verbalizes understanding and is agreeable with plan to dc. I discussed return precautions with patient, and encourage her to call or return to the ER should she have any questions or concerns.  BP 111/76 mmHg  Pulse 76  Temp(Src) 98.3 F (36.8 C) (Oral)  Resp 20  Ht 5\' 5"  (1.651 m)  Wt 170 lb (77.111 kg)  BMI 28.29 kg/m2  SpO2 100%  LMP 03/04/2014  Signed,  Dahlia Bailiff, PA-C 1:55 AM   Carrie Mew, PA-C 03/10/14 0155  Dot Lanes, MD 03/11/14 [phone]

## 2014-03-09 NOTE — ED Notes (Signed)
Pt reports 3 hour long episode today while working of stabbing headache, lightheaded, and intermittent numbness to both upper extremities. Hx of panic attacks

## 2014-03-09 NOTE — Discharge Instructions (Signed)
Follow-up with your primary care physician. Return to the ER with any worsening of symptoms, severe headache, blurred vision, dizziness, weakness, high fever.  Migraine Headache A migraine headache is an intense, throbbing pain on one or both sides of your head. A migraine can last for 30 minutes to several hours. CAUSES  The exact cause of a migraine headache is not always known. However, a migraine may be caused when nerves in the brain become irritated and release chemicals that cause inflammation. This causes pain. Certain things may also trigger migraines, such as:  Alcohol.  Smoking.  Stress.  Menstruation.  Aged cheeses.  Foods or drinks that contain nitrates, glutamate, aspartame, or tyramine.  Lack of sleep.  Chocolate.  Caffeine.  Hunger.  Physical exertion.  Fatigue.  Medicines used to treat chest pain (nitroglycerine), birth control pills, estrogen, and some blood pressure medicines. SIGNS AND SYMPTOMS  Pain on one or both sides of your head.  Pulsating or throbbing pain.  Severe pain that prevents daily activities.  Pain that is aggravated by any physical activity.  Nausea, vomiting, or both.  Dizziness.  Pain with exposure to bright lights, loud noises, or activity.  General sensitivity to bright lights, loud noises, or smells. Before you get a migraine, you may get warning signs that a migraine is coming (aura). An aura may include:  Seeing flashing lights.  Seeing bright spots, halos, or zigzag lines.  Having tunnel vision or blurred vision.  Having feelings of numbness or tingling.  Having trouble talking.  Having muscle weakness. DIAGNOSIS  A migraine headache is often diagnosed based on:  Symptoms.  Physical exam.  A CT scan or MRI of your head. These imaging tests cannot diagnose migraines, but they can help rule out other causes of headaches. TREATMENT Medicines may be given for pain and nausea. Medicines can also be given  to help prevent recurrent migraines.  HOME CARE INSTRUCTIONS  Only take over-the-counter or prescription medicines for pain or discomfort as directed by your health care provider. The use of long-term narcotics is not recommended.  Lie down in a dark, quiet room when you have a migraine.  Keep a journal to find out what may trigger your migraine headaches. For example, write down:  What you eat and drink.  How much sleep you get.  Any change to your diet or medicines.  Limit alcohol consumption.  Quit smoking if you smoke.  Get 7-9 hours of sleep, or as recommended by your health care provider.  Limit stress.  Keep lights dim if bright lights bother you and make your migraines worse. SEEK IMMEDIATE MEDICAL CARE IF:   Your migraine becomes severe.  You have a fever.  You have a stiff neck.  You have vision loss.  You have muscular weakness or loss of muscle control.  You start losing your balance or have trouble walking.  You feel faint or pass out.  You have severe symptoms that are different from your first symptoms. MAKE SURE YOU:   Understand these instructions.  Will watch your condition.  Will get help right away if you are not doing well or get worse. Document Released: 02/27/2005 Document Revised: 07/14/2013 Document Reviewed: 11/04/2012 [facility] Patient Information 2015 [place], [place]. This information is not intended to replace advice given to you by your health care provider. Make sure you discuss any questions you have with your health care provider.

## 2014-03-09 NOTE — ED Notes (Signed)
Pt reports was sitting at desk at work and started having left temporal headache and left arm numbness and tingling.  Pt reports just feels  "like she could pass out"

## 2014-03-11 ENCOUNTER — Ambulatory Visit (HOSPITAL_BASED_OUTPATIENT_CLINIC_OR_DEPARTMENT_OTHER)
Admission: RE | Admit: 2014-03-11 | Discharge: 2014-03-11 | Disposition: A | Discharge status: Home / Self Care | Payer: BC Managed Care – PPO | Source: Ambulatory Visit | Attending: Physician Assistant | Admitting: Physician Assistant

## 2014-03-11 ENCOUNTER — Ambulatory Visit (INDEPENDENT_AMBULATORY_CARE_PROVIDER_SITE_OTHER): Payer: BC Managed Care – PPO | Admitting: Physician Assistant

## 2014-03-11 ENCOUNTER — Encounter: Payer: Self-pay | Admitting: Physician Assistant

## 2014-03-11 VITALS — BP 140/87 | HR 68 | Temp 98.0°F | Resp 16 | Ht 64.0 in | Wt 175.4 lb

## 2014-03-11 DIAGNOSIS — R519 Headache, unspecified: Secondary | ICD-10-CM

## 2014-03-11 DIAGNOSIS — R209 Unspecified disturbances of skin sensation: Secondary | ICD-10-CM

## 2014-03-11 DIAGNOSIS — R51 Headache: Secondary | ICD-10-CM

## 2014-03-11 DIAGNOSIS — R42 Dizziness and giddiness: Secondary | ICD-10-CM | POA: Insufficient documentation

## 2014-03-11 DIAGNOSIS — R200 Anesthesia of skin: Secondary | ICD-10-CM | POA: Insufficient documentation

## 2014-03-11 DIAGNOSIS — IMO0001 Reserved for inherently not codable concepts without codable children: Secondary | ICD-10-CM

## 2014-03-11 LAB — TSH: TSH: 1.79 u[IU]/mL (ref 0.35–4.50)

## 2014-03-11 LAB — VITAMIN B12: Vitamin B-12: 259 pg/mL (ref 211–911)

## 2014-03-11 MED ORDER — HYDROCODONE-ACETAMINOPHEN 10-325 MG PO TABS: 1.0000 | ORAL_TABLET | Freq: Three times a day (TID) | ORAL | Status: DC | PRN

## 2014-03-11 NOTE — Progress Notes (Signed)
Pre visit review using our clinic review tool, if applicable. No additional management support is needed unless otherwise documented below in the visit note/SLS  

## 2014-03-11 NOTE — Patient Instructions (Signed)
Please go to the lab for blood work.  Stop at the front desk to speak with Pleas Koch to schedule your CT scan.  I will call you with all of your results.  Take they hydrocodone for headache.  Please take your Xanax twice daily today to help calm anxiety.  Follow-up with myself or Dr Birdie Riddle will be based on results.  If anything acutely worsens, please go directly to the ER.

## 2014-03-11 NOTE — Progress Notes (Signed)
Patient presents to clinic today as ER follow-up of migraine headache, who complains of continued headache, lightheadedness, tingling in arms bilateral but mostly L-sided that has persisted since discharge from ER.  ER workup reviewed and without alarming findings.  Patient with same complaints at that time that resolved with pain medication.  No imaging was obtained.  Patient states that this is not like her normal migraines.  Headache is currently a 7/10.  Has taken Excedrin Migraine with little relief of symptoms.  Patient states her symptoms have caused an increase in her anxiety but she refuses to believe current symptoms are actually symptoms of anxiety itself.  Denies any recent trauma or injury. Both patient and husband deny LOC or AMS.  Past Medical History  Diagnosis Date  . Anxiety   . Migraine     Current Outpatient Prescriptions on File Prior to Visit  Medication Sig Dispense Refill  . ALPRAZolam (XANAX PO) Take 0.5 tablets by mouth as needed.     . gabapentin (NEURONTIN) 100 MG capsule Take 1 capsule (100 mg total) by mouth as directed. One every 8 hours as needed 60 capsule 1   No current facility-administered medications on file prior to visit.    Allergies  Allergen Reactions  . Penicillins     REACTION: swelling  . Sulfate Rash  . Moxifloxacin     REACTION: nausea  . Sumatriptan Succinate     REACTION: nausea, dizziness, panic symptoms    Family History  Problem Relation Age of Onset  . Diabetes Mother   . Hypertension Father   . Colon cancer Paternal Grandmother   . Cancer Paternal Grandmother     colon    History   Social History  . Marital Status: Married    Spouse Name: N/A    Number of Children: N/A  . Years of Education: N/A   Social History Main Topics  . Smoking status: Current Some Day Smoker    Types: Cigarettes    Last Attempt to Quit: 04/09/2010  . Smokeless tobacco: None     Comment: 5cigs/day  . Alcohol Use: Yes  . Drug Use: No    . Sexual Activity: Yes   Other Topics Concern  . None   Social History Narrative   Review of Systems - See HPI.  All other ROS are negative.  BP 140/87 mmHg  Pulse 68  Temp(Src) 98 F (36.7 C) (Oral)  Resp 16  Ht '5\' 4"'  (1.626 m)  Wt 175 lb 6 oz (79.55 kg)  BMI 30.09 kg/m2  SpO2 100%  LMP 03/04/2014  Physical Exam  Constitutional: She is oriented to person, place, and time and well-developed, well-nourished, and in no distress.  HENT:  Head: Normocephalic and atraumatic.  Right Ear: External ear normal.  Left Ear: External ear normal.  Nose: Nose normal.  Mouth/Throat: Oropharynx is clear and moist. No oropharyngeal exudate.  TM within normal limits bilaterally.  Eyes: Conjunctivae and EOM are normal. Pupils are equal, round, and reactive to light.  Neck: Neck supple.  Cardiovascular: Normal rate, regular rhythm, normal heart sounds and intact distal pulses.   Pulmonary/Chest: Effort normal and breath sounds normal. No respiratory distress. She has no wheezes. She has no rales. She exhibits no tenderness.  Lymphadenopathy:    She has no cervical adenopathy.  Neurological: She is alert and oriented to person, place, and time. No cranial nerve deficit. Gait normal. Coordination normal. GCS score is 15.  Skin: Skin is warm and dry. No  rash noted.  Psychiatric: Her mood appears anxious. She exhibits ordered thought content, normal recent memory and normal remote memory.  Vitals reviewed.   Recent Results (from the past 2160 hour(s))  Basic metabolic panel     Status: None   Collection Time: 01/19/14 12:03 PM  Result Value Ref Range   Sodium 138 135 - 145 mEq/L   Potassium 3.9 3.5 - 5.1 mEq/L   Chloride 104 96 - 112 mEq/L   CO2 25 19 - 32 mEq/L   Glucose, Bld 83 70 - 99 mg/dL   BUN 10 6 - 23 mg/dL   Creatinine, Ser 0.8 0.4 - 1.2 mg/dL   Calcium 9.4 8.4 - 10.5 mg/dL   GFR 81.57 >60.00 mL/min  TSH     Status: None   Collection Time: 01/19/14 12:03 PM  Result Value  Ref Range   TSH 3.57 0.35 - 4.50 uIU/mL  CBC with Differential     Status: None   Collection Time: 01/19/14 12:03 PM  Result Value Ref Range   WBC 7.4 4.0 - 10.5 K/uL   RBC 4.90 3.87 - 5.11 Mil/uL   Hemoglobin 14.2 12.0 - 15.0 g/dL   HCT 43.0 36.0 - 46.0 %   MCV 87.6 78.0 - 100.0 fl   MCHC 33.0 30.0 - 36.0 g/dL   RDW 12.9 11.5 - 15.5 %   Platelets 306.0 150.0 - 400.0 K/uL   Neutrophils Relative % 60.8 43.0 - 77.0 %   Lymphocytes Relative 30.8 12.0 - 46.0 %   Monocytes Relative 6.4 3.0 - 12.0 %   Eosinophils Relative 1.5 0.0 - 5.0 %   Basophils Relative 0.5 0.0 - 3.0 %   Neutro Abs 4.5 1.4 - 7.7 K/uL   Lymphs Abs 2.3 0.7 - 4.0 K/uL   Monocytes Absolute 0.5 0.1 - 1.0 K/uL   Eosinophils Absolute 0.1 0.0 - 0.7 K/uL   Basophils Absolute 0.0 0.0 - 0.1 K/uL  B12     Status: None   Collection Time: 01/19/14 12:03 PM  Result Value Ref Range   Vitamin B-12 317 211 - 911 pg/mL  CBC with Differential     Status: None   Collection Time: 03/09/14  7:06 PM  Result Value Ref Range   WBC 7.1 4.0 - 10.5 K/uL   RBC 4.59 3.87 - 5.11 MIL/uL   Hemoglobin 13.7 12.0 - 15.0 g/dL   HCT 40.1 36.0 - 46.0 %   MCV 87.4 78.0 - 100.0 fL   MCH 29.8 26.0 - 34.0 pg   MCHC 34.2 30.0 - 36.0 g/dL   RDW 12.6 11.5 - 15.5 %   Platelets 326 150 - 400 K/uL   Neutrophils Relative % 56 43 - 77 %   Neutro Abs 4.0 1.7 - 7.7 K/uL   Lymphocytes Relative 35 12 - 46 %   Lymphs Abs 2.5 0.7 - 4.0 K/uL   Monocytes Relative 7 3 - 12 %   Monocytes Absolute 0.5 0.1 - 1.0 K/uL   Eosinophils Relative 2 0 - 5 %   Eosinophils Absolute 0.1 0.0 - 0.7 K/uL   Basophils Relative 0 0 - 1 %   Basophils Absolute 0.0 0.0 - 0.1 K/uL  Basic metabolic panel     Status: Abnormal   Collection Time: 03/09/14  7:06 PM  Result Value Ref Range   Sodium 139 135 - 145 mmol/L    Comment: Please note change in reference range.   Potassium 4.2 3.5 - 5.1 mmol/L    Comment:  Please note change in reference range.   Chloride 106 96 - 112 mEq/L    CO2 25 19 - 32 mmol/L   Glucose, Bld 90 70 - 99 mg/dL   BUN 10 6 - 23 mg/dL   Creatinine, Ser 0.84 0.50 - 1.10 mg/dL   Calcium 9.0 8.4 - 10.5 mg/dL   GFR calc non Af Amer 88 (L) >90 mL/min   GFR calc Af Amer >90 >90 mL/min    Comment: (NOTE) The eGFR has been calculated using the CKD EPI equation. This calculation has not been validated in all clinical situations. eGFR's persistently <90 mL/min signify possible Chronic Kidney Disease.    Anion gap 8 5 - 15  Urinalysis, Routine w reflex microscopic     Status: None   Collection Time: 03/09/14  8:57 PM  Result Value Ref Range   Color, Urine YELLOW YELLOW   APPearance CLEAR CLEAR   Specific Gravity, Urine 1.010 1.005 - 1.030   pH 5.5 5.0 - 8.0   Glucose, UA NEGATIVE NEGATIVE mg/dL   Hgb urine dipstick NEGATIVE NEGATIVE   Bilirubin Urine NEGATIVE NEGATIVE   Ketones, ur NEGATIVE NEGATIVE mg/dL   Protein, ur NEGATIVE NEGATIVE mg/dL   Urobilinogen, UA 0.2 0.0 - 1.0 mg/dL   Nitrite NEGATIVE NEGATIVE   Leukocytes, UA NEGATIVE NEGATIVE    Comment: MICROSCOPIC NOT DONE ON URINES WITH NEGATIVE PROTEIN, BLOOD, LEUKOCYTES, NITRITE, OR GLUCOSE <1000 mg/dL.  Pregnancy, urine     Status: None   Collection Time: 03/09/14  8:57 PM  Result Value Ref Range   Preg Test, Ur NEGATIVE NEGATIVE    Comment:        THE SENSITIVITY OF THIS METHODOLOGY IS >20 mIU/mL.   TSH     Status: None   Collection Time: 03/11/14 12:12 PM  Result Value Ref Range   TSH 1.79 0.35 - 4.50 uIU/mL  B12     Status: None   Collection Time: 03/11/14 12:12 PM  Result Value Ref Range   Vitamin B-12 259 211 - 911 pg/mL    Assessment/Plan: Severe headache Severe.  Patient endorses atypical.  Suspect significant migraine with paresthesias stemming from severe anxiety.  Exam within normal limits in clinic.  Patient visibly anxious.  Will obtain TSH, B12 and CT Head STAT to assess. Offered IM pain medication but patient refuses.  Will Rx Norco to help abort this headache.   Not to be used long-term.  I am hopeful that once headache is resolved, anxiety will lessen.  Patient instructed to take her Xanax BID over the next couple of days.  If CT with abnormality, will triage appropriately.  If negative, patient to follow-up in 1 week to begin medication like Elavil for migraine prophylaxis that will also help anxiety.  Alarm signs/symptoms discussed with patient and husband.  They are aware of when to call 911 or proceed to ER if indicated.

## 2014-03-15 DIAGNOSIS — R519 Headache, unspecified: Secondary | ICD-10-CM | POA: Insufficient documentation

## 2014-03-15 DIAGNOSIS — R51 Headache: Principal | ICD-10-CM

## 2014-03-15 NOTE — Assessment & Plan Note (Signed)
Severe.  Patient endorses atypical.  Suspect significant migraine with paresthesias stemming from severe anxiety.  Exam within normal limits in clinic.  Patient visibly anxious.  Will obtain TSH, B12 and CT Head STAT to assess. Offered IM pain medication but patient refuses.  Will Rx Norco to help abort this headache.  Not to be used long-term.  I am hopeful that once headache is resolved, anxiety will lessen.  Patient instructed to take her Xanax BID over the next couple of days.  If CT with abnormality, will triage appropriately.  If negative, patient to follow-up in 1 week to begin medication like Elavil for migraine prophylaxis that will also help anxiety.  Alarm signs/symptoms discussed with patient and husband.  They are aware of when to call 911 or proceed to ER if indicated.

## 2014-03-16 ENCOUNTER — Encounter: Payer: Self-pay | Admitting: Family Medicine

## 2014-03-18 ENCOUNTER — Ambulatory Visit: Payer: Self-pay

## 2014-03-18 ENCOUNTER — Ambulatory Visit (INDEPENDENT_AMBULATORY_CARE_PROVIDER_SITE_OTHER): Payer: BLUE CROSS/BLUE SHIELD

## 2014-03-18 DIAGNOSIS — E538 Deficiency of other specified B group vitamins: Secondary | ICD-10-CM

## 2014-03-18 MED ORDER — CYANOCOBALAMIN 1000 MCG/ML IJ SOLN
1000.0000 ug | Freq: Once | INTRAMUSCULAR | Status: AC
  Administered 2014-03-18: 1000 ug via INTRAMUSCULAR

## 2014-03-18 NOTE — Progress Notes (Signed)
No order in chart.  Verbal order given by Dr. Birdie Riddle to restart Vitamin b12 injections month and to follow up for anxiety.    Pt tolerated injection well.

## 2014-03-18 NOTE — Progress Notes (Signed)
Pre visit review using our clinic review tool, if applicable. No additional management support is needed unless otherwise documented below in the visit note. 

## 2014-04-17 ENCOUNTER — Ambulatory Visit (INDEPENDENT_AMBULATORY_CARE_PROVIDER_SITE_OTHER): Payer: BLUE CROSS/BLUE SHIELD | Admitting: *Deleted

## 2014-04-17 DIAGNOSIS — E538 Deficiency of other specified B group vitamins: Secondary | ICD-10-CM

## 2014-04-17 MED ORDER — CYANOCOBALAMIN 1000 MCG/ML IJ SOLN
1000.0000 ug | Freq: Once | INTRAMUSCULAR | Status: AC
  Administered 2014-04-17: 1000 ug via INTRAMUSCULAR

## 2014-04-17 NOTE — Progress Notes (Signed)
Pre visit review using our clinic review tool, if applicable. No additional management support is needed unless otherwise documented below in the visit note.  Patient tolerated injection well.   Next injection scheduled for 05/15/14 at 9:00am.

## 2014-05-11 ENCOUNTER — Encounter: Payer: Self-pay | Admitting: Family Medicine

## 2014-05-15 ENCOUNTER — Ambulatory Visit (INDEPENDENT_AMBULATORY_CARE_PROVIDER_SITE_OTHER): Payer: BLUE CROSS/BLUE SHIELD | Admitting: *Deleted

## 2014-05-15 DIAGNOSIS — E538 Deficiency of other specified B group vitamins: Secondary | ICD-10-CM

## 2014-05-15 MED ORDER — CYANOCOBALAMIN 1000 MCG/ML IJ SOLN
1000.0000 ug | Freq: Once | INTRAMUSCULAR | Status: AC
  Administered 2014-05-15: 1000 ug via INTRAMUSCULAR

## 2014-05-15 NOTE — Progress Notes (Signed)
Pre visit review using our clinic review tool, if applicable. No additional management support is needed unless otherwise documented below in the visit note.  Patient tolerated injection well.  Next injection scheduled for 06/19/14.

## 2014-05-20 ENCOUNTER — Encounter: Payer: Self-pay | Admitting: Family Medicine

## 2014-05-22 ENCOUNTER — Encounter: Payer: Self-pay | Admitting: Family Medicine

## 2014-05-22 ENCOUNTER — Encounter: Payer: Self-pay | Admitting: General Practice

## 2014-05-22 ENCOUNTER — Ambulatory Visit (INDEPENDENT_AMBULATORY_CARE_PROVIDER_SITE_OTHER): Payer: BLUE CROSS/BLUE SHIELD | Admitting: Family Medicine

## 2014-05-22 VITALS — BP 120/80 | HR 84 | Temp 98.0°F | Resp 16 | Wt 174.4 lb

## 2014-05-22 DIAGNOSIS — M542 Cervicalgia: Secondary | ICD-10-CM | POA: Insufficient documentation

## 2014-05-22 MED ORDER — CYCLOBENZAPRINE HCL 10 MG PO TABS: 10.0000 mg | ORAL_TABLET | Freq: Three times a day (TID) | ORAL | Status: DC | PRN

## 2014-05-22 MED ORDER — MELOXICAM 15 MG PO TABS: 15.0000 mg | ORAL_TABLET | Freq: Every day | ORAL | Status: DC

## 2014-05-22 NOTE — Progress Notes (Signed)
   Subjective:    Patient ID: Hayli Milligan, female    DOB: 09-Jul-1975, 39 y.o.   MRN: [id number]  HPI Neck pain- sxs started 1 week ago.  Pain is worsening rather than improving.  'it feels like a knot' on R side of neck.  No known injury but did walk energetic dog using R arm on Sunday.  No change in pillow or sleeping arrangement.  No pain w/ rotation of neck.  Some increased pain w/ excessive use of R arm.  + nasal congestion and 'sinus drainage'.  No fevers.   Review of Systems For ROS see HPI     Objective:   Physical Exam  Constitutional: She is oriented to person, place, and time. She appears well-developed and well-nourished. No distress.  HENT:  Head: Normocephalic and atraumatic.  Neck: Normal range of motion. Neck supple. No thyromegaly present.  No palpable masses, no lymphadenopathy Minimal TTP over R strap muscle  Musculoskeletal: She exhibits no edema.  Lymphadenopathy:    She has no cervical adenopathy.  Neurological: She is alert and oriented to person, place, and time. No cranial nerve deficit.  Skin: Skin is warm and dry. No erythema.  Psychiatric: She has a normal mood and affect. Her behavior is normal. Thought content normal.  Vitals reviewed.         Assessment & Plan:

## 2014-05-22 NOTE — Assessment & Plan Note (Signed)
New.  No palpable masses, minimal TTP over strap muscle.  Full ROM.  No redness or edema.  Start scheduled NSAIDs, flexeril prn.  Reviewed supportive care and red flags that should prompt return.  Pt expressed understanding and is in agreement w/ plan.

## 2014-05-22 NOTE — Progress Notes (Signed)
Pre visit review using our clinic review tool, if applicable. No additional management support is needed unless otherwise documented below in the visit note. 

## 2014-05-22 NOTE — Patient Instructions (Signed)
Follow up as needed Start the Mobic daily w/ food for the next few days for inflammation Use the flexeril at night for spasm- will cause drowsiness HEAT! Call with any questions or concerns Happy [place]!!!

## 2014-05-27 ENCOUNTER — Ambulatory Visit (HOSPITAL_BASED_OUTPATIENT_CLINIC_OR_DEPARTMENT_OTHER)
Admission: RE | Admit: 2014-05-27 | Discharge: 2014-05-27 | Disposition: A | Discharge status: Home / Self Care | Payer: BLUE CROSS/BLUE SHIELD | Source: Ambulatory Visit | Attending: Family Medicine | Admitting: Family Medicine

## 2014-05-27 ENCOUNTER — Encounter: Payer: Self-pay | Admitting: Family Medicine

## 2014-05-27 DIAGNOSIS — R221 Localized swelling, mass and lump, neck: Secondary | ICD-10-CM | POA: Diagnosis present

## 2014-05-27 DIAGNOSIS — M542 Cervicalgia: Secondary | ICD-10-CM

## 2014-05-27 NOTE — Telephone Encounter (Signed)
[place] placed per provider.

## 2014-06-01 ENCOUNTER — Ambulatory Visit (INDEPENDENT_AMBULATORY_CARE_PROVIDER_SITE_OTHER): Payer: BLUE CROSS/BLUE SHIELD | Admitting: Physician Assistant

## 2014-06-01 ENCOUNTER — Encounter: Payer: Self-pay | Admitting: Physician Assistant

## 2014-06-01 ENCOUNTER — Encounter: Payer: Self-pay | Admitting: Family Medicine

## 2014-06-01 VITALS — BP 122/80 | HR 82 | Temp 98.3°F | Resp 16 | Ht 64.0 in | Wt 172.1 lb

## 2014-06-01 DIAGNOSIS — M2660 Temporomandibular joint disorder, unspecified: Secondary | ICD-10-CM

## 2014-06-01 DIAGNOSIS — M26609 Unspecified temporomandibular joint disorder, unspecified side: Secondary | ICD-10-CM

## 2014-06-01 MED ORDER — CELECOXIB 100 MG PO CAPS: 100.0000 mg | ORAL_CAPSULE | Freq: Two times a day (BID) | ORAL | Status: DC

## 2014-06-01 NOTE — Assessment & Plan Note (Signed)
Cannot tolerate Mobic.  Rx Celebrex.  Ice to area. Mouth guard at night. Topical Aspercreme to the area.  If not improving, recommend x-ray of TMJ joints.

## 2014-06-01 NOTE — Patient Instructions (Signed)
Please take the Celebrex as directed with food. Apply topical Aspercreme to the affected area and around the ear. Avoid chewing on the affected side.  Ice the area. Wear a bite guard. Continue the Xanax at bedtime.  Follow-up if symptoms are not improving.

## 2014-06-01 NOTE — Progress Notes (Signed)
Pre visit review using our clinic review tool, if applicable. No additional management support is needed unless otherwise documented below in the visit note/SLS  

## 2014-06-01 NOTE — Progress Notes (Signed)
Patient presents to clinic today c/o continued R-sided neck/jaw pain, extending into her ear. Previous workup unremarkable.  Denies drainage from ear, tinnitus or change in hearing. Denies trauma or injury.  Does endorse creaking of her R jaw.  Past Medical History  Diagnosis Date  . Anxiety   . Migraine     Current Outpatient Prescriptions on File Prior to Visit  Medication Sig Dispense Refill  . ALPRAZolam (XANAX) 0.5 MG tablet Take 0.5 mg by mouth daily as needed.     Marland Kitchen aspirin-acetaminophen-caffeine (EXCEDRIN MIGRAINE) 250-250-65 MG per tablet Take 1 tablet by mouth every 6 (six) hours as needed for headache.    . gabapentin (NEURONTIN) 100 MG capsule Take 1 capsule (100 mg total) by mouth as directed. One every 8 hours as needed 60 capsule 1  . HYDROcodone-acetaminophen (NORCO) 10-325 MG per tablet Take 1 tablet by mouth every 8 (eight) hours as needed. 30 tablet 0  . ibuprofen (ADVIL,MOTRIN) 200 MG tablet Take 200 mg by mouth every 6 (six) hours as needed.     No current facility-administered medications on file prior to visit.    Allergies  Allergen Reactions  . Penicillins     REACTION: swelling  . Sulfate Rash  . Flexeril [Cyclobenzaprine] Swelling  . Moxifloxacin     REACTION: nausea  . Sumatriptan Succinate     REACTION: nausea, dizziness, panic symptoms    Family History  Problem Relation Age of Onset  . Diabetes Mother   . Hypertension Father   . Colon cancer Paternal Grandmother   . Cancer Paternal Grandmother     colon    History   Social History  . Marital Status: Married    Spouse Name: N/A  . Number of Children: N/A  . Years of Education: N/A   Social History Main Topics  . Smoking status: Current Some Day Smoker    Types: Cigarettes    Last Attempt to Quit: 04/09/2010  . Smokeless tobacco: Not on file     Comment: 5cigs/day  . Alcohol Use: Yes  . Drug Use: No  . Sexual Activity: Yes   Other Topics Concern  . None   Social History  Narrative   Review of Systems - See HPI.  All other ROS are negative.  BP 122/80 mmHg  Pulse 82  Temp(Src) 98.3 F (36.8 C) (Oral)  Resp 16  Ht '5\' 4"'  (1.626 m)  Wt 172 lb 2 oz (78.075 kg)  BMI 29.53 kg/m2  SpO2 100%  LMP 06/01/2014  Physical Exam  Constitutional: She is oriented to person, place, and time and well-developed, well-nourished, and in no distress.  HENT:  Head: Normocephalic and atraumatic.  Right Ear: External ear normal.  Left Ear: External ear normal.  Nose: Nose normal.  Mouth/Throat: Oropharynx is clear and moist. No oropharyngeal exudate.  TM within normal limits bilaterally.  + right-sided pain with ROM of jaw.  Eyes: Conjunctivae are normal. Pupils are equal, round, and reactive to light.  Neck: Neck supple.  Cardiovascular: Normal rate, regular rhythm, normal heart sounds and intact distal pulses.   Pulmonary/Chest: Effort normal and breath sounds normal. No respiratory distress. She has no wheezes. She has no rales. She exhibits no tenderness.  Lymphadenopathy:    She has no cervical adenopathy.  Neurological: She is alert and oriented to person, place, and time.  Skin: Skin is warm and dry. No rash noted.  Psychiatric:  Anxious affect.  Vitals reviewed.   Recent Results (from the past  2160 hour(s))  CBC with Differential     Status: None   Collection Time: 03/09/14  7:06 PM  Result Value Ref Range   WBC 7.1 4.0 - 10.5 K/uL   RBC 4.59 3.87 - 5.11 MIL/uL   Hemoglobin 13.7 12.0 - 15.0 g/dL   HCT 40.1 36.0 - 46.0 %   MCV 87.4 78.0 - 100.0 fL   MCH 29.8 26.0 - 34.0 pg   MCHC 34.2 30.0 - 36.0 g/dL   RDW 12.6 11.5 - 15.5 %   Platelets 326 150 - 400 K/uL   Neutrophils Relative % 56 43 - 77 %   Neutro Abs 4.0 1.7 - 7.7 K/uL   Lymphocytes Relative 35 12 - 46 %   Lymphs Abs 2.5 0.7 - 4.0 K/uL   Monocytes Relative 7 3 - 12 %   Monocytes Absolute 0.5 0.1 - 1.0 K/uL   Eosinophils Relative 2 0 - 5 %   Eosinophils Absolute 0.1 0.0 - 0.7 K/uL    Basophils Relative 0 0 - 1 %   Basophils Absolute 0.0 0.0 - 0.1 K/uL  Basic metabolic panel     Status: Abnormal   Collection Time: 03/09/14  7:06 PM  Result Value Ref Range   Sodium 139 135 - 145 mmol/L    Comment: Please note change in reference range.   Potassium 4.2 3.5 - 5.1 mmol/L    Comment: Please note change in reference range.   Chloride 106 96 - 112 mEq/L   CO2 25 19 - 32 mmol/L   Glucose, Bld 90 70 - 99 mg/dL   BUN 10 6 - 23 mg/dL   Creatinine, Ser 0.84 0.50 - 1.10 mg/dL   Calcium 9.0 8.4 - 10.5 mg/dL   GFR calc non Af Amer 88 (L) >90 mL/min   GFR calc Af Amer >90 >90 mL/min    Comment: (NOTE) The eGFR has been calculated using the CKD EPI equation. This calculation has not been validated in all clinical situations. eGFR's persistently <90 mL/min signify possible Chronic Kidney Disease.    Anion gap 8 5 - 15  Urinalysis, Routine w reflex microscopic     Status: None   Collection Time: 03/09/14  8:57 PM  Result Value Ref Range   Color, Urine YELLOW YELLOW   APPearance CLEAR CLEAR   Specific Gravity, Urine 1.010 1.005 - 1.030   pH 5.5 5.0 - 8.0   Glucose, UA NEGATIVE NEGATIVE mg/dL   Hgb urine dipstick NEGATIVE NEGATIVE   Bilirubin Urine NEGATIVE NEGATIVE   Ketones, ur NEGATIVE NEGATIVE mg/dL   Protein, ur NEGATIVE NEGATIVE mg/dL   Urobilinogen, UA 0.2 0.0 - 1.0 mg/dL   Nitrite NEGATIVE NEGATIVE   Leukocytes, UA NEGATIVE NEGATIVE    Comment: MICROSCOPIC NOT DONE ON URINES WITH NEGATIVE PROTEIN, BLOOD, LEUKOCYTES, NITRITE, OR GLUCOSE <1000 mg/dL.  Pregnancy, urine     Status: None   Collection Time: 03/09/14  8:57 PM  Result Value Ref Range   Preg Test, Ur NEGATIVE NEGATIVE    Comment:        THE SENSITIVITY OF THIS METHODOLOGY IS >20 mIU/mL.   TSH     Status: None   Collection Time: 03/11/14 12:12 PM  Result Value Ref Range   TSH 1.79 0.35 - 4.50 uIU/mL  B12     Status: None   Collection Time: 03/11/14 12:12 PM  Result Value Ref Range   Vitamin B-12  259 211 - 911 pg/mL    Assessment/Plan: TMJ dysfunction Cannot  tolerate Mobic.  Rx Celebrex.  Ice to area. Mouth guard at night. Topical Aspercreme to the area.  If not improving, recommend x-ray of TMJ joints.

## 2014-06-12 DIAGNOSIS — M797 Fibromyalgia: Secondary | ICD-10-CM

## 2014-06-12 HISTORY — DX: Fibromyalgia: M79.7

## 2014-06-14 ENCOUNTER — Encounter (HOSPITAL_BASED_OUTPATIENT_CLINIC_OR_DEPARTMENT_OTHER): Payer: Self-pay

## 2014-06-14 ENCOUNTER — Emergency Department (HOSPITAL_BASED_OUTPATIENT_CLINIC_OR_DEPARTMENT_OTHER)
Admission: EM | Admit: 2014-06-14 | Discharge: 2014-06-14 | Disposition: A | Discharge status: Home / Self Care | Payer: BLUE CROSS/BLUE SHIELD | Attending: Emergency Medicine | Admitting: Emergency Medicine

## 2014-06-14 ENCOUNTER — Emergency Department (HOSPITAL_BASED_OUTPATIENT_CLINIC_OR_DEPARTMENT_OTHER): Payer: BLUE CROSS/BLUE SHIELD

## 2014-06-14 DIAGNOSIS — Z720 Tobacco use: Secondary | ICD-10-CM | POA: Diagnosis not present

## 2014-06-14 DIAGNOSIS — Z880 Allergy status to penicillin: Secondary | ICD-10-CM | POA: Diagnosis not present

## 2014-06-14 DIAGNOSIS — M546 Pain in thoracic spine: Secondary | ICD-10-CM | POA: Diagnosis present

## 2014-06-14 DIAGNOSIS — G43909 Migraine, unspecified, not intractable, without status migrainosus: Secondary | ICD-10-CM | POA: Diagnosis not present

## 2014-06-14 DIAGNOSIS — F419 Anxiety disorder, unspecified: Secondary | ICD-10-CM | POA: Insufficient documentation

## 2014-06-14 DIAGNOSIS — Z3202 Encounter for pregnancy test, result negative: Secondary | ICD-10-CM | POA: Diagnosis not present

## 2014-06-14 DIAGNOSIS — Z791 Long term (current) use of non-steroidal anti-inflammatories (NSAID): Secondary | ICD-10-CM | POA: Insufficient documentation

## 2014-06-14 DIAGNOSIS — Z79899 Other long term (current) drug therapy: Secondary | ICD-10-CM | POA: Diagnosis not present

## 2014-06-14 LAB — URINALYSIS, ROUTINE W REFLEX MICROSCOPIC
Bilirubin Urine: NEGATIVE
Glucose, UA: NEGATIVE mg/dL
HGB URINE DIPSTICK: NEGATIVE
Ketones, ur: NEGATIVE mg/dL
Leukocytes, UA: NEGATIVE
Nitrite: NEGATIVE
PH: 8 (ref 5.0–8.0)
Protein, ur: NEGATIVE mg/dL
Specific Gravity, Urine: 1.014 (ref 1.005–1.030)
UROBILINOGEN UA: 0.2 mg/dL (ref 0.0–1.0)

## 2014-06-14 LAB — CBC WITH DIFFERENTIAL/PLATELET
BASOS PCT: 0 % (ref 0–1)
Basophils Absolute: 0 10*3/uL (ref 0.0–0.1)
Eosinophils Absolute: 0.1 10*3/uL (ref 0.0–0.7)
Eosinophils Relative: 1 % (ref 0–5)
HCT: 45.6 % (ref 36.0–46.0)
Hemoglobin: 15.2 g/dL — ABNORMAL HIGH (ref 12.0–15.0)
Lymphocytes Relative: 31 % (ref 12–46)
Lymphs Abs: 2.5 10*3/uL (ref 0.7–4.0)
MCH: 29.6 pg (ref 26.0–34.0)
MCHC: 33.3 g/dL (ref 30.0–36.0)
MCV: 88.7 fL (ref 78.0–100.0)
Monocytes Absolute: 0.5 10*3/uL (ref 0.1–1.0)
Monocytes Relative: 6 % (ref 3–12)
NEUTROS ABS: 4.9 10*3/uL (ref 1.7–7.7)
NEUTROS PCT: 62 % (ref 43–77)
Platelets: 265 10*3/uL (ref 150–400)
RBC: 5.14 MIL/uL — AB (ref 3.87–5.11)
RDW: 12.6 % (ref 11.5–15.5)
WBC: 8 10*3/uL (ref 4.0–10.5)

## 2014-06-14 LAB — BASIC METABOLIC PANEL
Anion gap: 8 (ref 5–15)
BUN: 9 mg/dL (ref 6–23)
CHLORIDE: 106 mmol/L (ref 96–112)
CO2: 23 mmol/L (ref 19–32)
Calcium: 9.1 mg/dL (ref 8.4–10.5)
Creatinine, Ser: 0.8 mg/dL (ref 0.50–1.10)
GFR calc non Af Amer: >90 mL/min (ref >90)
Glucose, Bld: 92 mg/dL (ref 70–99)
POTASSIUM: 4.1 mmol/L (ref 3.5–5.1)
SODIUM: 137 mmol/L (ref 135–145)

## 2014-06-14 LAB — D-DIMER, QUANTITATIVE (NOT AT ARMC): D DIMER QUANT: 0.34 ug{FEU}/mL (ref 0.00–0.48)

## 2014-06-14 LAB — TROPONIN I

## 2014-06-14 LAB — PREGNANCY, URINE: PREG TEST UR: NEGATIVE

## 2014-06-14 MED ORDER — SODIUM CHLORIDE 0.9 % IV BOLUS (SEPSIS)
1000.0000 mL | Freq: Once | INTRAVENOUS | Status: AC
  Administered 2014-06-14: 1000 mL via INTRAVENOUS

## 2014-06-14 MED ORDER — IBUPROFEN 400 MG PO TABS
600.0000 mg | ORAL_TABLET | Freq: Once | ORAL | Status: AC
  Administered 2014-06-14: 600 mg via ORAL
  Filled 2014-06-14 (×2): qty 1

## 2014-06-14 NOTE — ED Notes (Signed)
MD at bedside. 

## 2014-06-14 NOTE — ED Notes (Signed)
Patient here with thoracic back pain since Thursday, pain with movement and change in position. Has been on trip prior to the pain and did a lot of walking

## 2014-06-14 NOTE — ED Provider Notes (Signed)
CSN: [id number]     Arrival date & time 06/14/14  7564 History   First MD Initiated Contact with Patient 06/14/14 1109     Chief Complaint  Patient presents with  . Back Pain     (Consider location/radiation/quality/duration/timing/severity/associated sxs/prior Treatment) Patient is a 39 y.o. female presenting with back pain.  Back Pain Location:  Thoracic spine (left sided) Quality:  Aching Radiates to: around sides to front on both sides  Pain severity:  Moderate Onset quality:  Gradual Duration:  3 days Timing:  Constant Progression:  Worsening Chronicity:  New Context comment:  First started while walking on vacation Relieved by:  Nothing Worsened by:  Nothing tried (not worse with position changes, movement, deep inspiration. ) Associated symptoms: no abdominal pain, no chest pain, no dysuria and no fever   Associated symptoms comment:  No shortness of breath, no fevers   Past Medical History  Diagnosis Date  . Anxiety   . Migraine    Past Surgical History  Procedure Laterality Date  . G2 p2     Family History  Problem Relation Age of Onset  . Diabetes Mother   . Hypertension Father   . Colon cancer Paternal Grandmother   . Cancer Paternal Grandmother     colon   History  Substance Use Topics  . Smoking status: Current Some Day Smoker    Types: Cigarettes    Last Attempt to Quit: 04/09/2010  . Smokeless tobacco: Not on file     Comment: 5cigs/day  . Alcohol Use: Yes   OB History    No data available     Review of Systems  Constitutional: Negative for fever.  Cardiovascular: Negative for chest pain.  Gastrointestinal: Negative for abdominal pain.  Genitourinary: Negative for dysuria.  Musculoskeletal: Positive for back pain.  All other systems reviewed and are negative.     Allergies  Penicillins; Sulfate; Flexeril; Moxifloxacin; and Sumatriptan succinate  Home Medications   Prior to Admission medications   Medication Sig Start Date End  Date Taking? Authorizing Provider  ALPRAZolam Duanne Moron) 0.5 MG tablet Take 0.5 mg by mouth daily as needed.  05/13/14   Historical Provider, MD  aspirin-acetaminophen-caffeine (EXCEDRIN MIGRAINE) [phone] MG per tablet Take 1 tablet by mouth every 6 (six) hours as needed for headache.    Historical Provider, MD  celecoxib (CELEBREX) 100 MG capsule Take 1 capsule (100 mg total) by mouth 2 (two) times daily. 06/01/14   Brunetta Jeans, PA-C  gabapentin (NEURONTIN) 100 MG capsule Take 1 capsule (100 mg total) by mouth as directed. One every 8 hours as needed 01/13/11   Hendricks Limes, MD  HYDROcodone-acetaminophen [facility]) 10-325 MG per tablet Take 1 tablet by mouth every 8 (eight) hours as needed. 03/11/14   Brunetta Jeans, PA-C  ibuprofen (ADVIL,MOTRIN) 200 MG tablet Take 200 mg by mouth every 6 (six) hours as needed.    Historical Provider, MD   BP 125/92 mmHg  Pulse 77  Temp(Src) 98.3 F (36.8 C) (Oral)  Resp 18  Ht 5\' 4"  (1.626 m)  Wt 172 lb (78.019 kg)  BMI 29.51 kg/m2  SpO2 97%  LMP 06/01/2014 Physical Exam  Constitutional: She is oriented to person, place, and time. She appears well-developed and well-nourished. No distress.  HENT:  Head: Normocephalic and atraumatic.  Mouth/Throat: Oropharynx is clear and moist.  Eyes: Conjunctivae are normal. Pupils are equal, round, and reactive to light. No scleral icterus.  Neck: Neck supple.  Cardiovascular: Normal rate, regular  rhythm, normal heart sounds and intact distal pulses.   No murmur heard. Pulmonary/Chest: Effort normal and breath sounds normal. No stridor. No respiratory distress. She has no rales.  Abdominal: Soft. Bowel sounds are normal. She exhibits no distension. There is no tenderness.  Musculoskeletal: Normal range of motion.       Back:  Neurological: She is alert and oriented to person, place, and time.  Skin: Skin is warm and dry. No rash noted.  Psychiatric: She has a normal mood and affect. Her behavior is normal.    Nursing note and vitals reviewed.   ED Course  Procedures (including critical care time) Labs Review Labs Reviewed  CBC WITH DIFFERENTIAL/PLATELET - Abnormal; Notable for the following:    RBC 5.14 (*)    Hemoglobin 15.2 (*)    All other components within normal limits  URINALYSIS, ROUTINE W REFLEX MICROSCOPIC  PREGNANCY, URINE  BASIC METABOLIC PANEL  TROPONIN I  D-DIMER, QUANTITATIVE    Imaging Review Dg Chest 2 View  06/14/2014   CLINICAL DATA:  Left-sided chest pain beginning 3 days ago.  EXAM: CHEST  2 VIEW  COMPARISON:  07/07/2013  FINDINGS: Heart size is normal. Mediastinal shadows are normal. The lungs are clear. No bronchial thickening. No infiltrate, mass, effusion or collapse. Pulmonary vascularity is normal. No bony abnormality.  IMPRESSION: Normal chest   Electronically Signed   By: Nelson Chimes M.D.   On: 06/14/2014 12:22  All radiology studies independently viewed by me.      EKG Interpretation None      MDM   Final diagnoses:  Left-sided thoracic back pain    38 rolled female with left-sided thoracic back pain. Not worse with movement. No other associated symptoms. Pain seems somewhat atypical but patient describes as severe. Labs are normal including d-dimer. Chest x-ray unremarkable. I think pain is most likely musculoskeletal given her tenderness on exam. Advised NSAIDs and follow-up with her primary doctor if pain persists. Return precautions given.    Serita Grit, MD 06/14/14 [phone]

## 2014-06-14 NOTE — ED Notes (Signed)
Patient transported to X-ray ambulatory with tech. 

## 2014-06-14 NOTE — ED Notes (Signed)
Checked on pt, pt appears uncomfortable, still remains tearful.  Denies need for pain meds despite same.  Spoke with pt on medicating for pain, pt states does not want to take narcotics b/c "they make me feel weird", informed can give nonnarcotic pain med and pt consenting.

## 2014-06-14 NOTE — Discharge Instructions (Signed)
Back Pain, Adult Low back pain is very common. About 1 in 5 people have back pain.The cause of low back pain is rarely dangerous. The pain often gets better over time.About half of people with a sudden onset of back pain feel better in just 2 weeks. About 8 in 10 people feel better by 6 weeks.  CAUSES Some common causes of back pain include:  Strain of the muscles or ligaments supporting the spine.  Wear and tear (degeneration) of the spinal discs.  Arthritis.  Direct injury to the back. DIAGNOSIS Most of the time, the direct cause of low back pain is not known.However, back pain can be treated effectively even when the exact cause of the pain is unknown.Answering your caregiver's questions about your overall health and symptoms is one of the most accurate ways to make sure the cause of your pain is not dangerous. If your caregiver needs more information, he or she may order lab work or imaging tests (X-rays or MRIs).However, even if imaging tests show changes in your back, this usually does not require surgery. HOME CARE INSTRUCTIONS For many people, back pain returns.Since low back pain is rarely dangerous, it is often a condition that people can learn to manageon their own.   Remain active. It is stressful on the back to sit or stand in one place. Do not sit, drive, or stand in one place for more than 30 minutes at a time. Take short walks on level surfaces as soon as pain allows.Try to increase the length of time you walk each day.  Do not stay in bed.Resting more than 1 or 2 days can delay your recovery.  Do not avoid exercise or work.Your body is made to move.It is not dangerous to be active, even though your back may hurt.Your back will likely heal faster if you return to being active before your pain is gone.  Pay attention to your body when you bend and lift. Many people have less discomfortwhen lifting if they bend their knees, keep the load close to their bodies,and  avoid twisting. Often, the most comfortable positions are those that put less stress on your recovering back.  Find a comfortable position to sleep. Use a firm mattress and lie on your side with your knees slightly bent. If you lie on your back, put a pillow under your knees.  Only take over-the-counter or prescription medicines as directed by your caregiver. Over-the-counter medicines to reduce pain and inflammation are often the most helpful.Your caregiver may prescribe muscle relaxant drugs.These medicines help dull your pain so you can more quickly return to your normal activities and healthy exercise.  Put ice on the injured area.  Put ice in a plastic bag.  Place a towel between your skin and the bag.  Leave the ice on for 15-20 minutes, 03-04 times a day for the first 2 to 3 days. After that, ice and heat may be alternated to reduce pain and spasms.  Ask your caregiver about trying back exercises and gentle massage. This may be of some benefit.  Avoid feeling anxious or stressed.Stress increases muscle tension and can worsen back pain.It is important to recognize when you are anxious or stressed and learn ways to manage it.Exercise is a great option. SEEK MEDICAL CARE IF:  You have pain that is not relieved with rest or medicine.  You have pain that does not improve in 1 week.  You have new symptoms.  You are generally not feeling well. SEEK   IMMEDIATE MEDICAL CARE IF:   You have pain that radiates from your back into your legs.  You develop new bowel or bladder control problems.  You have unusual weakness or numbness in your arms or legs.  You develop nausea or vomiting.  You develop abdominal pain.  You feel faint. Document Released: 02/27/2005 Document Revised: 08/29/2011 Document Reviewed: 07/01/2013 Exit[employer] Patient Information 2015 [employer]. This information is not intended to replace advice given to you by your health care provider. Make sure you  discuss any questions you have with your health care provider.  

## 2014-06-14 NOTE — ED Notes (Signed)
Pt placed on cardiac, pulse ox and bp monitoring.  Denies need for pain meds at this time.  Tearful.  Husband to bedside.  Call bell placed within reach.

## 2014-06-16 ENCOUNTER — Encounter: Payer: Self-pay | Admitting: Family Medicine

## 2014-06-18 ENCOUNTER — Ambulatory Visit (INDEPENDENT_AMBULATORY_CARE_PROVIDER_SITE_OTHER): Payer: BLUE CROSS/BLUE SHIELD | Admitting: Family Medicine

## 2014-06-18 ENCOUNTER — Encounter: Payer: Self-pay | Admitting: Family Medicine

## 2014-06-18 VITALS — BP 140/80 | HR 64 | Temp 98.1°F | Resp 16 | Wt 170.4 lb

## 2014-06-18 DIAGNOSIS — M797 Fibromyalgia: Secondary | ICD-10-CM | POA: Diagnosis not present

## 2014-06-18 DIAGNOSIS — F411 Generalized anxiety disorder: Secondary | ICD-10-CM

## 2014-06-18 DIAGNOSIS — D518 Other vitamin B12 deficiency anemias: Secondary | ICD-10-CM | POA: Diagnosis not present

## 2014-06-18 MED ORDER — CYANOCOBALAMIN 1000 MCG/ML IJ SOLN
1000.0000 ug | Freq: Once | INTRAMUSCULAR | Status: AC
  Administered 2014-06-18: 1000 ug via INTRAMUSCULAR

## 2014-06-18 MED ORDER — DULOXETINE HCL 20 MG PO CPEP: 20.0000 mg | ORAL_CAPSULE | Freq: Every day | ORAL | Status: DC

## 2014-06-18 NOTE — Progress Notes (Signed)
   Subjective:    Patient ID: Jaclyn Tucker, female    DOB: 1975/04/21, 39 y.o.   MRN: [id number]  HPI Pain- 'it started w/ the neck pain.  Then it went to my jaw.  Now it's in my back.  My arms are sensitive to touch.  They hurt.  I have no appetite.  I'm losing weight'.  Pt reports increased fatigue and sleep.  Pain in back is sharp and will occasionally radiate to arm.  Went to ER on 4/3- normal labs and CXR, no evidence of PE.  Pt had severe abdominal pain w/ Celebrex.  Pt has hx of costochondritis, pelvic pain.  Pt is tearful and struggles w/ anxiety.   Review of Systems For ROS see HPI     Objective:   Physical Exam  Constitutional: She is oriented to person, place, and time. She appears well-developed and well-nourished.  HENT:  Head: Normocephalic and atraumatic.  Neck: Normal range of motion. Neck supple. No thyromegaly present.  Cardiovascular: Intact distal pulses.   Pulmonary/Chest: No respiratory distress.  Musculoskeletal: She exhibits tenderness (TTP over multiple trigger points). She exhibits no edema.  Lymphadenopathy:    She has no cervical adenopathy.  Neurological: She is alert and oriented to person, place, and time. She has normal reflexes. No cranial nerve deficit. Coordination normal.  Skin: Skin is warm and dry.  Psychiatric:  Tearful, anxious  Vitals reviewed.         Assessment & Plan:

## 2014-06-18 NOTE — Assessment & Plan Note (Signed)
Deteriorated.  Pt is very anxious about her recent physical sxs.  Suspect the anxiety is actually worsening her physical sxs.  Will start Cymbalta to treat both anxiety and pain.  Reviewed supportive care and red flags that should prompt return.  Pt expressed understanding and is in agreement w/ plan.

## 2014-06-18 NOTE — Patient Instructions (Addendum)
Follow up in 3-4 weeks to recheck mood and pain- we will give next B12 shot then Start the Cymbalta once daily (in the AM) This is consistent w/ fibromyalgia and we can make this better (but it may take some time) Call with any questions or concerns Hang in there!!  We'll get this!

## 2014-06-18 NOTE — Assessment & Plan Note (Signed)
New.  Suspect pt's migratory pains and anxiety are due to underlying fibromyalgia.  Discussed dx w/ pt and agreed that low dose cymbalta would help treat both anxiety and pain.  Will follow closely.

## 2014-06-18 NOTE — Assessment & Plan Note (Signed)
injxn given today. 

## 2014-06-18 NOTE — Progress Notes (Signed)
Pre visit review using our clinic review tool, if applicable. No additional management support is needed unless otherwise documented below in the visit note. 

## 2014-06-19 ENCOUNTER — Ambulatory Visit: Payer: BLUE CROSS/BLUE SHIELD

## 2014-06-22 ENCOUNTER — Encounter: Payer: Self-pay | Admitting: Family Medicine

## 2014-06-22 ENCOUNTER — Ambulatory Visit (INDEPENDENT_AMBULATORY_CARE_PROVIDER_SITE_OTHER): Payer: BLUE CROSS/BLUE SHIELD | Admitting: Family Medicine

## 2014-06-22 VITALS — BP 134/80 | HR 101 | Temp 98.3°F | Resp 16 | Wt 168.4 lb

## 2014-06-22 DIAGNOSIS — F411 Generalized anxiety disorder: Secondary | ICD-10-CM | POA: Diagnosis not present

## 2014-06-22 DIAGNOSIS — R0789 Other chest pain: Secondary | ICD-10-CM | POA: Insufficient documentation

## 2014-06-22 DIAGNOSIS — K219 Gastro-esophageal reflux disease without esophagitis: Secondary | ICD-10-CM | POA: Insufficient documentation

## 2014-06-22 MED ORDER — CITALOPRAM HYDROBROMIDE 10 MG PO TABS: 10.0000 mg | ORAL_TABLET | Freq: Every day | ORAL | Status: DC

## 2014-06-22 MED ORDER — PANTOPRAZOLE SODIUM 20 MG PO TBEC: 20.0000 mg | DELAYED_RELEASE_TABLET | Freq: Every day | ORAL | Status: DC

## 2014-06-22 MED ORDER — GI COCKTAIL ~~LOC~~
30.0000 mL | Freq: Once | ORAL | Status: AC
  Administered 2014-06-22: 30 mL via ORAL

## 2014-06-22 MED ORDER — SUCRALFATE 1 G PO TABS: 1.0000 g | ORAL_TABLET | Freq: Three times a day (TID) | ORAL | Status: DC

## 2014-06-22 NOTE — Progress Notes (Signed)
Pre visit review using our clinic review tool, if applicable. No additional management support is needed unless otherwise documented below in the visit note. 

## 2014-06-22 NOTE — Assessment & Plan Note (Signed)
Deteriorated.  Pt did not tolerate Cymbalta- this triggered a panic attack for pt.  Will switch to low dose Celexa as pt still clearly needs a daily controller medication but likely does not need the activation associated w/ norepinephrine.  Will follow closely.

## 2014-06-22 NOTE — Patient Instructions (Signed)
Follow up in 2-3 weeks to recheck anxiety and heartburn STOP the Cymbalta START the Celexa 10mg  daily Start the Protonix 20mg  daily to decrease the acid production Take the carafate prior to meals and before bed to allow the stomach lining time to heal (forms a protective barrier between stomach lining and acid/food) Take the xanax as needed- that's what it's there for Call with any questions or concerns Hang in there!  You can do this!

## 2014-06-22 NOTE — Assessment & Plan Note (Signed)
New.  Pt's TTP is likely a component of her fibromyalgia along w/ burning from GERD.  EKG w/o acute abnormality and no cause for CP identified.  Reassurance provided.  Will follow.

## 2014-06-22 NOTE — Assessment & Plan Note (Signed)
New.  Since pt's substernal burning resolved w/ GI cocktail in office, suspect that increased acid production and GERD are the cause of most of her pain.  Start PPI, carafate and monitor closely for improvement.  Reviewed supportive care and red flags that should prompt return.  Pt expressed understanding and is in agreement w/ plan.

## 2014-06-22 NOTE — Progress Notes (Signed)
   Subjective:    Patient ID: Jaclyn Tucker, female    DOB: [birth year], 39 y.o.   MRN: [id number]  HPI  Medication reaction- pt reports feeling shaky, having palpitations, clammy feeling, having substernal CP and severe pain w/ eating and drinking since starting Cymbalta.  Pt did not take Xanax while she was experiencing her panicked feelings.  Pt listened to meditation/breathing exercise activity from 1-5am to try and calm down but this didn't work.  No appetite b/c pt fears the chest pain w/ eating.  sxs started on Friday.  Pt reports sternum is TTP.  The burning resolved w/ GI cocktail in office.   Review of Systems For ROS see HPI     Objective:   Physical Exam  Constitutional: She is oriented to person, place, and time. She appears well-developed and well-nourished.  Anxious, visibly upset  HENT:  Head: Normocephalic and atraumatic.  Eyes: Conjunctivae and EOM are normal. Pupils are equal, round, and reactive to light.  Neck: Normal range of motion. Neck supple.  Cardiovascular: Normal rate, regular rhythm, normal heart sounds and intact distal pulses.   Pulmonary/Chest: Effort normal and breath sounds normal. No respiratory distress. She has no wheezes. She has no rales. She exhibits tenderness (over sternum).  Abdominal: Soft. Bowel sounds are normal. She exhibits no distension. There is no tenderness. There is no rebound and no guarding.  Lymphadenopathy:    She has no cervical adenopathy.  Neurological: She is alert and oriented to person, place, and time. No cranial nerve deficit. Coordination normal.  Skin: Skin is warm and dry.  Psychiatric:  anxious  Vitals reviewed.         Assessment & Plan:

## 2014-06-22 NOTE — Telephone Encounter (Signed)
Spoke with patient regarding MyChart message.  She states that the symptoms began several days ago, she first thought it was allergies but it has not resolved.  The pain occurs in her chest, between her breasts whenever she swallows.  The pain is not there when she is not eating or drinking.  She states she is not able to eat/ drink except a little water due to the symptoms and anxiety.  She is shaky, sweaty, and feels very anxious.  She denies shortness of breath.  Patient stated she has tried taking TUMS without relief.    Appointment scheduled for 06/22/14 at 11:00

## 2014-06-30 ENCOUNTER — Encounter: Payer: Self-pay | Admitting: Family Medicine

## 2014-07-06 ENCOUNTER — Emergency Department (HOSPITAL_BASED_OUTPATIENT_CLINIC_OR_DEPARTMENT_OTHER): Payer: BLUE CROSS/BLUE SHIELD

## 2014-07-06 ENCOUNTER — Other Ambulatory Visit: Payer: Self-pay

## 2014-07-06 ENCOUNTER — Encounter (HOSPITAL_BASED_OUTPATIENT_CLINIC_OR_DEPARTMENT_OTHER): Payer: Self-pay | Admitting: *Deleted

## 2014-07-06 ENCOUNTER — Telehealth: Payer: Self-pay | Admitting: Family Medicine

## 2014-07-06 ENCOUNTER — Emergency Department (HOSPITAL_BASED_OUTPATIENT_CLINIC_OR_DEPARTMENT_OTHER)
Admission: EM | Admit: 2014-07-06 | Discharge: 2014-07-06 | Disposition: A | Discharge status: Home / Self Care | Payer: BLUE CROSS/BLUE SHIELD | Attending: Emergency Medicine | Admitting: Emergency Medicine

## 2014-07-06 DIAGNOSIS — F419 Anxiety disorder, unspecified: Secondary | ICD-10-CM | POA: Diagnosis not present

## 2014-07-06 DIAGNOSIS — Z720 Tobacco use: Secondary | ICD-10-CM | POA: Insufficient documentation

## 2014-07-06 DIAGNOSIS — G43909 Migraine, unspecified, not intractable, without status migrainosus: Secondary | ICD-10-CM | POA: Diagnosis not present

## 2014-07-06 DIAGNOSIS — Z79899 Other long term (current) drug therapy: Secondary | ICD-10-CM | POA: Diagnosis not present

## 2014-07-06 DIAGNOSIS — R079 Chest pain, unspecified: Secondary | ICD-10-CM | POA: Diagnosis present

## 2014-07-06 DIAGNOSIS — R0789 Other chest pain: Secondary | ICD-10-CM

## 2014-07-06 LAB — COMPREHENSIVE METABOLIC PANEL
ALBUMIN: 4.1 g/dL (ref 3.5–5.2)
ALT: 13 U/L (ref 0–35)
ANION GAP: 7 (ref 5–15)
AST: 15 U/L (ref 0–37)
Alkaline Phosphatase: 61 U/L (ref 39–117)
BUN: 11 mg/dL (ref 6–23)
CALCIUM: 8.7 mg/dL (ref 8.4–10.5)
CO2: 24 mmol/L (ref 19–32)
Chloride: 106 mmol/L (ref 96–112)
Creatinine, Ser: 0.8 mg/dL (ref 0.50–1.10)
GFR calc Af Amer: >90 mL/min (ref >90)
Glucose, Bld: 94 mg/dL (ref 70–99)
Potassium: 4 mmol/L (ref 3.5–5.1)
SODIUM: 137 mmol/L (ref 135–145)
Total Bilirubin: 0.5 mg/dL (ref 0.3–1.2)
Total Protein: 7.3 g/dL (ref 6.0–8.3)

## 2014-07-06 LAB — CBC WITH DIFFERENTIAL/PLATELET
BASOS ABS: 0 10*3/uL (ref 0.0–0.1)
Basophils Relative: 0 % (ref 0–1)
Eosinophils Absolute: 0.1 10*3/uL (ref 0.0–0.7)
Eosinophils Relative: 1 % (ref 0–5)
HEMATOCRIT: 41.3 % (ref 36.0–46.0)
Hemoglobin: 14 g/dL (ref 12.0–15.0)
Lymphocytes Relative: 20 % (ref 12–46)
Lymphs Abs: 2 10*3/uL (ref 0.7–4.0)
MCH: 29.7 pg (ref 26.0–34.0)
MCHC: 33.9 g/dL (ref 30.0–36.0)
MCV: 87.7 fL (ref 78.0–100.0)
MONOS PCT: 4 % (ref 3–12)
Monocytes Absolute: 0.4 10*3/uL (ref 0.1–1.0)
Neutro Abs: 7.5 10*3/uL (ref 1.7–7.7)
Neutrophils Relative %: 75 % (ref 43–77)
Platelets: 310 10*3/uL (ref 150–400)
RBC: 4.71 MIL/uL (ref 3.87–5.11)
RDW: 12.3 % (ref 11.5–15.5)
WBC: 10 10*3/uL (ref 4.0–10.5)

## 2014-07-06 LAB — D-DIMER, QUANTITATIVE: D-Dimer, Quant: 0.38 ug/mL-FEU (ref 0.00–0.48)

## 2014-07-06 LAB — TROPONIN I: Troponin I: <0.03 ng/mL (ref <0.031)

## 2014-07-06 MED ORDER — KETOROLAC TROMETHAMINE 30 MG/ML IJ SOLN
30.0000 mg | Freq: Once | INTRAMUSCULAR | Status: AC
  Administered 2014-07-06: 30 mg via INTRAVENOUS
  Filled 2014-07-06: qty 1

## 2014-07-06 NOTE — ED Notes (Signed)
Dizziness at work. Nausea. Pain in her chest into her right neck. Feels shaky.

## 2014-07-06 NOTE — ED Provider Notes (Signed)
CSN: [id number]     Arrival date & time 07/06/14  1235 History  This chart was scribed for Veryl Speak, MD by Stephania Fragmin, ED Scribe. This patient was seen in room MH10/MH10 and the patient's care was started at 3:32 PM.   Chief Complaint  Patient presents with  . Dizziness  . Chest Pain   Patient is a 39 y.o. female presenting with dizziness and chest pain. The history is provided by the patient and medical records. No language interpreter was used.  Dizziness Quality:  Unable to specify Severity:  Moderate Onset quality:  Sudden Duration:  3 hours Timing:  Constant Progression:  Unchanged Chronicity:  New Context comment:  At work Relieved by:  Nothing Worsened by:  Nothing Ineffective treatments:  None tried Associated symptoms: chest pain and nausea   Chest pain:    Quality: burning     Severity:  Moderate   Onset quality:  Sudden   Duration:  3 hours   Timing:  Constant   Progression:  Unchanged   Chronicity:  New Risk factors: new medications   Chest Pain Associated symptoms: back pain, dizziness and nausea      HPI Comments: Kla Bily is a 39 y.o. female with a history of anxiety who presents to the Emergency Department complaining of sudden onset, constant dizziness that began at work today. She walked it off but then felt an intermittent burning sensation in her chest, arms, and back; and nausea. She initially thought this was a panic attack, but states she has never felt that burning sensation before, and this has never happened before. 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 However, she has been more busy than usual this week. Patient however denies any other symptoms this week before this onset. She has a history of fibromyalgia and started her on Celexa a few weeks ago. Patient is fairly active, as she runs around to take care of her child. Patient is a former smoker.  Patient had also been seen  here 3 weeks ago for stabbing back pain. Per medical records, her pain was determined to be musculoskeletal.  Past Medical History  Diagnosis Date  . Anxiety   . Migraine    Past Surgical History  Procedure Laterality Date  . G2 p2     Family History  Problem Relation Age of Onset  . Diabetes Mother   . Hypertension Father   . Colon cancer Paternal Grandmother   . Cancer Paternal Grandmother     colon   History  Substance Use Topics  . Smoking status: Current Some Day Smoker    Types: Cigarettes    Last Attempt to Quit: 04/09/2010  . Smokeless tobacco: Not on file     Comment: 5cigs/day  . Alcohol Use: Yes   OB History    No data available     Review of Systems  Cardiovascular: Positive for chest pain.  Gastrointestinal: Positive for nausea.  Musculoskeletal: Positive for back pain.  Neurological: Positive for dizziness.  All other systems reviewed and are negative.  Allergies  Penicillins; Sulfate; Flexeril; Moxifloxacin; and Sumatriptan succinate  Home Medications   Prior to Admission medications   Medication Sig Start Date End Date Taking? Authorizing Provider  ALPRAZolam Duanne Moron) 0.5 MG tablet Take 0.5 mg by mouth daily as needed.  05/13/14   Historical Provider, MD  aspirin-acetaminophen-caffeine (EXCEDRIN MIGRAINE) [phone] MG per tablet Take 1 tablet by mouth every 6 (six)  hours as needed for headache.    Historical Provider, MD  citalopram (CELEXA) 10 MG tablet Take 1 tablet (10 mg total) by mouth daily. 06/22/14   Midge Minium, MD  cyclobenzaprine (FLEXERIL) 10 MG tablet  05/22/14   Historical Provider, MD  gabapentin (NEURONTIN) 100 MG capsule Take 1 capsule (100 mg total) by mouth as directed. One every 8 hours as needed 01/13/11   Hendricks Limes, MD  HYDROcodone-acetaminophen [facility]) 10-325 MG per tablet Take 1 tablet by mouth every 8 (eight) hours as needed. 03/11/14   Brunetta Jeans, PA-C  ibuprofen (ADVIL,MOTRIN) 200 MG tablet Take 200 mg by  mouth every 6 (six) hours as needed.    Historical Provider, MD  pantoprazole (PROTONIX) 20 MG tablet Take 1 tablet (20 mg total) by mouth daily. 06/22/14   Midge Minium, MD  sucralfate (CARAFATE) 1 G tablet Take 1 tablet (1 g total) by mouth 4 (four) times daily -  with meals and at bedtime. 06/22/14   Midge Minium, MD   BP 124/81 mmHg  Pulse 68  Temp(Src) 98.2 F (36.8 C) (Oral)  Resp 20  Ht 5\' 4"  (1.626 m)  Wt 168 lb (76.204 kg)  BMI 28.82 kg/m2  SpO2 100%  LMP 06/24/2014 Physical Exam  Constitutional: She is oriented to person, place, and time. She appears well-developed and well-nourished. No distress.  HENT:  Head: Normocephalic and atraumatic.  Right Ear: Hearing normal.  Left Ear: Hearing normal.  Nose: Nose normal.  Mouth/Throat: Oropharynx is clear and moist and mucous membranes are normal.  Eyes: Conjunctivae and EOM are normal. Pupils are equal, round, and reactive to light.  Neck: Normal range of motion. Neck supple.  Cardiovascular: Regular rhythm, S1 normal and S2 normal.  Exam reveals no gallop and no friction rub.   No murmur heard. Pulmonary/Chest: Effort normal and breath sounds normal. No respiratory distress. She exhibits tenderness.  There is tenderness to palpation to the upper chest and posterior chest on the left.   Abdominal: Soft. Normal appearance and bowel sounds are normal. There is no hepatosplenomegaly. There is no tenderness. There is no rebound, no guarding, no tenderness at McBurney's point and negative Murphy's sign. No hernia.  Musculoskeletal: Normal range of motion.  Neurological: She is alert and oriented to person, place, and time. She has normal strength. No cranial nerve deficit or sensory deficit. Coordination normal. GCS eye subscore is 4. GCS verbal subscore is 5. GCS motor subscore is 6.  Skin: Skin is warm, dry and intact. No rash noted. No cyanosis.  Psychiatric: She has a normal mood and affect. Her speech is normal and  behavior is normal. Thought content normal.  Nursing note and vitals reviewed.   ED Course  Procedures (including critical care time)  DIAGNOSTIC STUDIES: Oxygen Saturation is 100% on room air, normal by my interpretation.    COORDINATION OF CARE: 3:37 PM - Discussed treatment plan with pt at bedside which includes CXR and blood tests, and pt agreed to plan.   Labs Review Labs Reviewed  COMPREHENSIVE METABOLIC PANEL  CBC WITH DIFFERENTIAL/PLATELET  TROPONIN I  D-DIMER, QUANTITATIVE    Imaging Review Dg Chest 2 View  07/06/2014   CLINICAL DATA:  Dizziness and chest pain  EXAM: CHEST  2 VIEW  COMPARISON:  06/14/2014  FINDINGS: Normal heart size and mediastinal contours. No acute infiltrate or edema. No effusion or pneumothorax. No acute osseous findings.  IMPRESSION: No active cardiopulmonary disease.   Electronically Signed  By: Monte Fantasia M.D.   On: 07/06/2014 15:57     EKG Interpretation   Date/Time:  Monday July 06 2014 12:40:51 EDT Ventricular Rate:  71 PR Interval:  120 QRS Duration: 82 QT Interval:  400 QTC Calculation: 434 R Axis:   49 Text Interpretation:  Normal sinus rhythm Normal ECG Confirmed by  ZACKOWSKI  MD, SCOTT [phone]) on 07/06/2014 1:12:38 PM      MDM   Final diagnoses:  None     Patient is a 39 year old female who presents with complaints of pain in her right upper chest that started while at work. She began feeling very dizzy, nauseated, and shaky. Her symptoms clinically sound like anxiety and medical workup fails to reveal an alternate diagnosis. Her EKG and troponin are negative, chest x-ray is clear, and d-dimer is negative. She is now feeling better with Toradol and appears very comfortable. I believe she is appropriate for discharge with follow-up with her primary doctor. She was also recently started on Celexa for anxiety and this could possibly be a side effect from this medication.   I personally performed the services described in  this documentation, which was scribed in my presence. The recorded information has been reviewed and is accurate.      Veryl Speak, MD 07/06/14 [id number]

## 2014-07-06 NOTE — Discharge Instructions (Signed)
Ibuprofen 600 mg every six hours as needed for pain.  Followup with your primary doctor in the next few days if symptoms persist.   Chest Pain (Nonspecific) It is often hard to give a specific diagnosis for the cause of chest pain. There is always a chance that your pain could be related to something serious, such as a heart attack or a blood clot in the lungs. You need to follow up with your health care provider for further evaluation. CAUSES   Heartburn.  Pneumonia or bronchitis.  Anxiety or stress.  Inflammation around your heart (pericarditis) or lung (pleuritis or pleurisy).  A blood clot in the lung.  A collapsed lung (pneumothorax). It can develop suddenly on its own (spontaneous pneumothorax) or from trauma to the chest.  Shingles infection (herpes zoster virus). The chest wall is composed of bones, muscles, and cartilage. Any of these can be the source of the pain.  The bones can be bruised by injury.  The muscles or cartilage can be strained by coughing or overwork.  The cartilage can be affected by inflammation and become sore (costochondritis). DIAGNOSIS  Lab tests or other studies may be needed to find the cause of your pain. Your health care provider may have you take a test called an ambulatory electrocardiogram (ECG). An ECG records your heartbeat patterns over a 24-hour period. You may also have other tests, such as:  Transthoracic echocardiogram (TTE). During echocardiography, sound waves are used to evaluate how blood flows through your heart.  Transesophageal echocardiogram (TEE).  Cardiac monitoring. This allows your health care provider to monitor your heart rate and rhythm in real time.  Holter monitor. This is a portable device that records your heartbeat and can help diagnose heart arrhythmias. It allows your health care provider to track your heart activity for several days, if needed.  Stress tests by exercise or by giving medicine that makes the  heart beat faster. TREATMENT   Treatment depends on what may be causing your chest pain. Treatment may include:  Acid blockers for heartburn.  Anti-inflammatory medicine.  Pain medicine for inflammatory conditions.  Antibiotics if an infection is present.  You may be advised to change lifestyle habits. This includes stopping smoking and avoiding alcohol, caffeine, and chocolate.  You may be advised to keep your head raised (elevated) when sleeping. This reduces the chance of acid going backward from your stomach into your esophagus. Most of the time, nonspecific chest pain will improve within 2-3 days with rest and mild pain medicine.  HOME CARE INSTRUCTIONS   If antibiotics were prescribed, take them as directed. Finish them even if you start to feel better.  For the next few days, avoid physical activities that bring on chest pain. Continue physical activities as directed.  Do not use any tobacco products, including cigarettes, chewing tobacco, or electronic cigarettes.  Avoid drinking alcohol.  Only take medicine as directed by your health care provider.  Follow your health care provider's suggestions for further testing if your chest pain does not go away.  Keep any follow-up appointments you made. If you do not go to an appointment, you could develop lasting (chronic) problems with pain. If there is any problem keeping an appointment, call to reschedule. SEEK MEDICAL CARE IF:   Your chest pain does not go away, even after treatment.  You have a rash with blisters on your chest.  You have a fever. SEEK IMMEDIATE MEDICAL CARE IF:   You have increased chest pain or  pain that spreads to your arm, neck, jaw, back, or abdomen.  You have shortness of breath.  You have an increasing cough, or you cough up blood.  You have severe back or abdominal pain.  You feel nauseous or vomit.  You have severe weakness.  You faint.  You have chills. This is an emergency. Do  not wait to see if the pain will go away. Get medical help at once. Call your local emergency services (911 in [place] Do not drive yourself to the hospital. MAKE SURE YOU:   Understand these instructions.  Will watch your condition.  Will get help right away if you are not doing well or get worse. Document Released: 12/07/2004 Document Revised: 03/04/2013 Document Reviewed: 10/03/2007 [facility] Patient Information 2015 [place], [place]. This information is not intended to replace advice given to you by your health care provider. Make sure you discuss any questions you have with your health care provider.

## 2014-07-06 NOTE — Telephone Encounter (Signed)
Patient Name: Jaclyn Tucker DOB: 08/03/1975 Initial Comment Caller States she is shaky, dizzy, nausea, vomited, chest and back burning sensation, when she breaths in it feels like she cant deep a full breath. she has history of anxiety attacks. Nurse Assessment Nurse: Vallery Sa, RN, Cathy Date/Time (Eastern Time): 07/06/2014 12:30:06 PM Confirm and document reason for call. If symptomatic, describe symptoms. ---Caller states she developed breathing difficulty, dizziness and chest pain about an hour ago. No injury in the past 3 days. Has the patient traveled out of the country within the last 30 days? ---No Does the patient require triage? ---Yes Related visit to physician within the last 2 weeks? ---No Does the PT have any chronic conditions? (i.e. diabetes, asthma, etc.) ---Yes List chronic conditions. ---Anxiety, Fibromyalgia Did the patient indicate they were pregnant? ---No Guidelines Guideline Title Affirmed Question Affirmed Notes Breathing Difficulty [1] MODERATE difficulty breathing (e.g., speaks in phrases, SOB even at rest, pulse 100-120) AND [2] NEW-onset or WORSE than normal Final Disposition User Go to ED Now Vallery Sa, RN, [name] On the way to [facility] ER now.

## 2014-07-06 NOTE — Telephone Encounter (Signed)
Patient in [facility] ED.

## 2014-07-07 ENCOUNTER — Encounter: Payer: Self-pay | Admitting: Family Medicine

## 2014-07-10 ENCOUNTER — Ambulatory Visit (INDEPENDENT_AMBULATORY_CARE_PROVIDER_SITE_OTHER): Payer: BLUE CROSS/BLUE SHIELD | Admitting: Family Medicine

## 2014-07-10 ENCOUNTER — Encounter: Payer: Self-pay | Admitting: Family Medicine

## 2014-07-10 VITALS — BP 128/80 | HR 108 | Temp 98.1°F | Resp 16 | Wt 164.4 lb

## 2014-07-10 DIAGNOSIS — R42 Dizziness and giddiness: Secondary | ICD-10-CM

## 2014-07-10 DIAGNOSIS — R1013 Epigastric pain: Secondary | ICD-10-CM | POA: Insufficient documentation

## 2014-07-10 DIAGNOSIS — R1084 Generalized abdominal pain: Secondary | ICD-10-CM

## 2014-07-10 DIAGNOSIS — K219 Gastro-esophageal reflux disease without esophagitis: Secondary | ICD-10-CM | POA: Diagnosis not present

## 2014-07-10 DIAGNOSIS — G4489 Other headache syndrome: Secondary | ICD-10-CM

## 2014-07-10 LAB — CBC WITH DIFFERENTIAL/PLATELET
BASOS PCT: 0.3 % (ref 0.0–3.0)
Basophils Absolute: 0 10*3/uL (ref 0.0–0.1)
EOS ABS: 0 10*3/uL (ref 0.0–0.7)
EOS PCT: 0.6 % (ref 0.0–5.0)
HEMATOCRIT: 43.7 % (ref 36.0–46.0)
Hemoglobin: 14.9 g/dL (ref 12.0–15.0)
LYMPHS PCT: 22.6 % (ref 12.0–46.0)
Lymphs Abs: 1.7 10*3/uL (ref 0.7–4.0)
MCHC: 34.2 g/dL (ref 30.0–36.0)
MCV: 86 fl (ref 78.0–100.0)
MONO ABS: 0.4 10*3/uL (ref 0.1–1.0)
MONOS PCT: 4.9 % (ref 3.0–12.0)
NEUTROS ABS: 5.3 10*3/uL (ref 1.4–7.7)
NEUTROS PCT: 71.6 % (ref 43.0–77.0)
PLATELETS: 307 10*3/uL (ref 150.0–400.0)
RBC: 5.08 Mil/uL (ref 3.87–5.11)
RDW: 12.7 % (ref 11.5–15.5)
WBC: 7.3 10*3/uL (ref 4.0–10.5)

## 2014-07-10 LAB — BASIC METABOLIC PANEL
BUN: 9 mg/dL (ref 6–23)
CO2: 25 mEq/L (ref 19–32)
Calcium: 9.4 mg/dL (ref 8.4–10.5)
Chloride: 105 mEq/L (ref 96–112)
Creatinine, Ser: 0.78 mg/dL (ref 0.40–1.20)
GFR: 88.62 mL/min (ref >60.00)
Glucose, Bld: 90 mg/dL (ref 70–99)
Potassium: 3.7 mEq/L (ref 3.5–5.1)
SODIUM: 138 meq/L (ref 135–145)

## 2014-07-10 LAB — HEPATIC FUNCTION PANEL
ALT: 12 U/L (ref 0–35)
AST: 13 U/L (ref 0–37)
Albumin: 4.2 g/dL (ref 3.5–5.2)
Alkaline Phosphatase: 62 U/L (ref 39–117)
BILIRUBIN TOTAL: 0.7 mg/dL (ref 0.2–1.2)
Bilirubin, Direct: 0.1 mg/dL (ref 0.0–0.3)
Total Protein: 7.5 g/dL (ref 6.0–8.3)

## 2014-07-10 LAB — H. PYLORI ANTIBODY, IGG: H Pylori IgG: NEGATIVE

## 2014-07-10 LAB — GLUCOSE, POCT (MANUAL RESULT ENTRY): POC Glucose: 82 mg/dl (ref 70–99)

## 2014-07-10 MED ORDER — GI COCKTAIL ~~LOC~~
30.0000 mL | Freq: Once | ORAL | Status: AC
  Administered 2014-07-10: 30 mL via ORAL

## 2014-07-10 MED ORDER — SUCRALFATE 1 G PO TABS: 1.0000 g | ORAL_TABLET | Freq: Three times a day (TID) | ORAL | Status: DC

## 2014-07-10 MED ORDER — PANTOPRAZOLE SODIUM 40 MG PO TBEC: 40.0000 mg | DELAYED_RELEASE_TABLET | Freq: Two times a day (BID) | ORAL | Status: DC

## 2014-07-10 MED ORDER — ONDANSETRON 4 MG PO TBDP: 4.0000 mg | ORAL_TABLET | Freq: Three times a day (TID) | ORAL | Status: DC | PRN

## 2014-07-10 NOTE — Progress Notes (Signed)
   Subjective:    Patient ID: Jaclyn Tucker, female    DOB: [birth year], 39 y.o.   MRN: [id number]  HPI abd pain- 'i can't eat or drink anything'.  Pt reports stomach is painful to touch.  Taking Protonix and carafate.  sxs started on Monday.  'it just gnaws'.  Went to ER earlier this week and had normal work up- they felt sxs were due to untreated anxiety.  Pt reports she is hungry but is avoiding eating due to the pain.  No vomiting.  + nausea.  Intermittent diarrhea.  Denies constipation.  Some dizziness but reports this occurs when she skips meals.  + HA.  Review of Systems For ROS see HPI     Objective:   Physical Exam  Constitutional: She is oriented to person, place, and time. She appears well-developed and well-nourished. No distress.  HENT:  Head: Normocephalic and atraumatic.  Eyes: Conjunctivae and EOM are normal. Pupils are equal, round, and reactive to light.  Neck: Normal range of motion. Neck supple. No thyromegaly present.  Cardiovascular: Normal rate, regular rhythm, normal heart sounds and intact distal pulses.   No murmur heard. Pulmonary/Chest: Effort normal and breath sounds normal. No respiratory distress.  Abdominal: Soft. Bowel sounds are normal. She exhibits no distension. There is tenderness (epigastric TTP). There is guarding (voluntary guarding). There is no rebound.  Musculoskeletal: She exhibits no edema.  Lymphadenopathy:    She has no cervical adenopathy.  Neurological: She is alert and oriented to person, place, and time.  Skin: Skin is warm and dry.  Psychiatric: She has a normal mood and affect. Her behavior is normal.  Vitals reviewed.         Assessment & Plan:

## 2014-07-10 NOTE — Patient Instructions (Signed)
Follow up as needed We'll notify you of your lab results and make any changes if needed Start the Protonix 40mg  twice daily Continue the Carafate Use the Zofran for nausea Make sure you are eating and drinking regularly to avoid dizziness and other hypoglycemia symptoms Call with any questions or concerns Hang in there!!

## 2014-07-12 NOTE — Assessment & Plan Note (Signed)
Deteriorated.  Suspect increased acid production is due to pt's very high anxiety level.  Increase PPI.  Attempt to get anxiety under better control.  Will follow closely.

## 2014-07-12 NOTE — Assessment & Plan Note (Addendum)
Pt's pain and sxs consistent w/ possible PUD.  Check labs to r/o H pylori.  Differential includes gallbladder issues, biliary issues, pancreatic issues- check labs to r/o these as possible causes.  Pt reports pain improved in office w/ GI cocktail- leaning towards PUD as likely cause.  Increase strength and frequency of PPI.  Continue carafate.  Reviewed dietary and lifestyle modifications.  If no improvement, will need GI referral.  Pt expressed understanding and is in agreement w/ plan.

## 2014-07-12 NOTE — Assessment & Plan Note (Signed)
Suspect this is due to pt's high stress level and now in combination w/ hypoglycemia.  Stressed need to eat regularly to avoid symptoms.  Will follow.

## 2014-07-23 ENCOUNTER — Ambulatory Visit (INDEPENDENT_AMBULATORY_CARE_PROVIDER_SITE_OTHER): Payer: BLUE CROSS/BLUE SHIELD | Admitting: Family Medicine

## 2014-07-23 ENCOUNTER — Encounter: Payer: Self-pay | Admitting: Family Medicine

## 2014-07-23 VITALS — BP 122/80 | HR 77 | Temp 98.1°F | Resp 16 | Wt 167.1 lb

## 2014-07-23 DIAGNOSIS — R1013 Epigastric pain: Secondary | ICD-10-CM

## 2014-07-23 DIAGNOSIS — D518 Other vitamin B12 deficiency anemias: Secondary | ICD-10-CM

## 2014-07-23 DIAGNOSIS — F411 Generalized anxiety disorder: Secondary | ICD-10-CM

## 2014-07-23 MED ORDER — CYANOCOBALAMIN 1000 MCG/ML IJ SOLN
1000.0000 ug | Freq: Once | INTRAMUSCULAR | Status: AC
Start: 2014-07-23 — End: 2014-07-23
  Administered 2014-07-23: 1000 ug via INTRAMUSCULAR

## 2014-07-23 MED ORDER — PANTOPRAZOLE SODIUM 40 MG PO TBEC: 40.0000 mg | DELAYED_RELEASE_TABLET | Freq: Every day | ORAL | Status: DC

## 2014-07-23 MED ORDER — TRAZODONE HCL 50 MG PO TABS: 25.0000 mg | ORAL_TABLET | Freq: Every evening | ORAL | Status: DC | PRN

## 2014-07-23 NOTE — Assessment & Plan Note (Signed)
Much improved since starting Protonix and Carafate.  Pt is doing well on GERD diet.  No changes at this time, will attempt to wean off carafate.  Reviewed supportive care and red flags that should prompt return.  Pt expressed understanding and is in agreement w/ plan.

## 2014-07-23 NOTE — Assessment & Plan Note (Signed)
B12 injxn given today.

## 2014-07-23 NOTE — Progress Notes (Signed)
   Subjective:    Patient ID: Jaclyn Tucker, female    DOB: 01-04-1976, 39 y.o.   MRN: [id number]  HPI abd pain- 'it's better... Mostly'  Pt had flare after eating Tacos on [employer].  Pt is now able to eat regularly, minimal nausea.  Not requiring Zofran.  Doing ok on the 40mg  daily rather than BID.  Anxiety- has not had panic attack in 2 weeks.  Doing much better on Celexa.  Still struggling w/ insomnia.  Will fall asleep but is then up at 2 am regularly.  Has eliminated all caffeine.  Following a sleep routine at night.  B12 deficiency- due for B12 injxn today.   Review of Systems For ROS see HPI     Objective:   Physical Exam  Constitutional: She is oriented to person, place, and time. She appears well-developed and well-nourished. No distress.  HENT:  Head: Normocephalic and atraumatic.  Eyes: Conjunctivae and EOM are normal. Pupils are equal, round, and reactive to light.  Pulmonary/Chest: No respiratory distress.  Abdominal: Soft. Bowel sounds are normal. She exhibits no distension. There is no tenderness. There is no rebound and no guarding.  Musculoskeletal: She exhibits no edema or tenderness.  Neurological: She is alert and oriented to person, place, and time. No cranial nerve deficit. Coordination normal.  Skin: Skin is warm and dry.  Psychiatric: She has a normal mood and affect. Her behavior is normal. Thought content normal.  Vitals reviewed.         Assessment & Plan:

## 2014-07-23 NOTE — Patient Instructions (Signed)
Follow up by phone in 3-4 weeks and let me know how the sleep is doing Continue the Celexa daily Continue the Protonix daily Try and wean the Carafate in the next 2 weeks Start the Trazodone nightly for insomnia- take 1/2 tab and go up if needed Call with any questions or concerns THIS IS SO MUCH BETTER!!!

## 2014-07-23 NOTE — Assessment & Plan Note (Signed)
Improved since starting Celexa.  Still struggling w/ insomnia.  Will start trazodone.  If no improvement on Trazodone will consider Belsomra.  Pt expressed understanding and is in agreement w/ plan.

## 2014-07-23 NOTE — Progress Notes (Signed)
Pre visit review using our clinic review tool, if applicable. No additional management support is needed unless otherwise documented below in the visit note. 

## 2014-07-23 NOTE — Addendum Note (Signed)
Addended by: Kris Hartmann on: 07/23/2014 11:41 AM   Modules accepted: Orders

## 2014-08-24 ENCOUNTER — Encounter: Payer: Self-pay | Admitting: Family Medicine

## 2014-08-24 ENCOUNTER — Telehealth: Payer: Self-pay | Admitting: Family Medicine

## 2014-08-24 NOTE — Telephone Encounter (Signed)
Please advise, i see where injection was given at last OV. No B12 on file?

## 2014-08-24 NOTE — Telephone Encounter (Signed)
Pt scheduled for b12 injection 08/26/14.

## 2014-08-24 NOTE — Telephone Encounter (Signed)
fyi

## 2014-08-24 NOTE — Telephone Encounter (Signed)
Last B12 was done in December.  Pt is ok to have injection

## 2014-08-25 ENCOUNTER — Encounter: Payer: Self-pay | Admitting: Family Medicine

## 2014-08-25 ENCOUNTER — Encounter: Payer: Self-pay | Admitting: Internal Medicine

## 2014-08-25 DIAGNOSIS — R1013 Epigastric pain: Secondary | ICD-10-CM

## 2014-08-25 MED ORDER — ONDANSETRON 4 MG PO TBDP: 4.0000 mg | ORAL_TABLET | Freq: Three times a day (TID) | ORAL | Status: DC | PRN

## 2014-08-25 MED ORDER — SUCRALFATE 1 G PO TABS: 1.0000 g | ORAL_TABLET | Freq: Three times a day (TID) | ORAL | Status: DC

## 2014-08-25 NOTE — Telephone Encounter (Signed)
meds filled and referral placed.

## 2014-08-26 ENCOUNTER — Ambulatory Visit (INDEPENDENT_AMBULATORY_CARE_PROVIDER_SITE_OTHER): Payer: BLUE CROSS/BLUE SHIELD | Admitting: *Deleted

## 2014-08-26 ENCOUNTER — Telehealth: Payer: Self-pay | Admitting: Internal Medicine

## 2014-08-26 DIAGNOSIS — E538 Deficiency of other specified B group vitamins: Secondary | ICD-10-CM | POA: Diagnosis not present

## 2014-08-26 MED ORDER — CYANOCOBALAMIN 1000 MCG/ML IJ SOLN
1000.0000 ug | Freq: Once | INTRAMUSCULAR | Status: AC
  Administered 2014-08-26: 1000 ug via INTRAMUSCULAR

## 2014-08-26 NOTE — Telephone Encounter (Signed)
Pt came into the office today for her B12 injection. Advised that she was seen in the ER last night and had a CT scan completed (negative). Pt refused an abdominal US and will wait on the GI referral. Pt also stated an understanding about adding OTC medication.

## 2014-08-26 NOTE — Telephone Encounter (Signed)
She continue with abdominal pain, initial blood work showed no anemia, per chart review the DDX includes gallbladder disease. Plan:  Continue Protonix, Carafate. Add Pepcid OTC at bedtime Schedule abdominal ultrasound DX abdominal pain rule out gallbladder ER if symptoms severe, increased pain, diarrhea or blood in the stools. See if  GI could see sooner. As far as having a beer , okay w/ me unless it makes the stomach pain worse.

## 2014-08-26 NOTE — Progress Notes (Signed)
Pre visit review using our clinic review tool, if applicable. No additional management support is needed unless otherwise documented below in the visit note.   Jaclyn Minium, MD at 08/24/2014 4:10 PM     Status: Signed       Expand All Collapse All   Last B12 was done in December. Pt is ok to have injection       Pt. tolerated injection well.

## 2014-09-25 ENCOUNTER — Ambulatory Visit (INDEPENDENT_AMBULATORY_CARE_PROVIDER_SITE_OTHER): Payer: BLUE CROSS/BLUE SHIELD | Admitting: *Deleted

## 2014-09-25 DIAGNOSIS — E538 Deficiency of other specified B group vitamins: Secondary | ICD-10-CM

## 2014-09-25 MED ORDER — CYANOCOBALAMIN 1000 MCG/ML IJ SOLN
1000.0000 ug | Freq: Once | INTRAMUSCULAR | Status: AC
  Administered 2014-09-25: 1000 ug via INTRAMUSCULAR

## 2014-09-25 NOTE — Progress Notes (Signed)
Pre visit review using our clinic review tool, if applicable. No additional management support is needed unless otherwise documented below in the visit note.   Patient tolerated injection well.  Patient would like to call back to office to schedule next injection.

## 2014-10-28 ENCOUNTER — Ambulatory Visit (INDEPENDENT_AMBULATORY_CARE_PROVIDER_SITE_OTHER): Payer: BLUE CROSS/BLUE SHIELD | Admitting: *Deleted

## 2014-10-28 DIAGNOSIS — E538 Deficiency of other specified B group vitamins: Secondary | ICD-10-CM | POA: Diagnosis not present

## 2014-10-28 MED ORDER — CYANOCOBALAMIN 1000 MCG/ML IJ SOLN
1000.0000 ug | Freq: Once | INTRAMUSCULAR | Status: AC
  Administered 2014-10-28: 1000 ug via INTRAMUSCULAR

## 2014-10-28 NOTE — Progress Notes (Signed)
Pre visit review using our clinic review tool, if applicable. No additional management support is needed unless otherwise documented below in the visit note.  Patient tolerated injection well.  Per the patient, she will call the office to schedule next injection.

## 2014-11-04 ENCOUNTER — Ambulatory Visit (INDEPENDENT_AMBULATORY_CARE_PROVIDER_SITE_OTHER): Payer: BLUE CROSS/BLUE SHIELD | Admitting: Internal Medicine

## 2014-11-04 ENCOUNTER — Other Ambulatory Visit (INDEPENDENT_AMBULATORY_CARE_PROVIDER_SITE_OTHER): Payer: BLUE CROSS/BLUE SHIELD

## 2014-11-04 ENCOUNTER — Encounter: Payer: Self-pay | Admitting: Internal Medicine

## 2014-11-04 VITALS — BP 102/80 | HR 84 | Ht 64.0 in | Wt 169.2 lb

## 2014-11-04 DIAGNOSIS — R1013 Epigastric pain: Secondary | ICD-10-CM | POA: Diagnosis not present

## 2014-11-04 DIAGNOSIS — F411 Generalized anxiety disorder: Secondary | ICD-10-CM

## 2014-11-04 DIAGNOSIS — K589 Irritable bowel syndrome without diarrhea: Secondary | ICD-10-CM | POA: Diagnosis not present

## 2014-11-04 HISTORY — DX: Irritable bowel syndrome, unspecified: K58.9

## 2014-11-04 LAB — LIPASE: LIPASE: 20 U/L (ref 11.0–59.0)

## 2014-11-04 LAB — CBC WITH DIFFERENTIAL/PLATELET
BASOS ABS: 0 10*3/uL (ref 0.0–0.1)
Basophils Relative: 0.4 % (ref 0.0–3.0)
Eosinophils Absolute: 0.1 10*3/uL (ref 0.0–0.7)
Eosinophils Relative: 1.4 % (ref 0.0–5.0)
HEMATOCRIT: 44.2 % (ref 36.0–46.0)
HEMOGLOBIN: 14.9 g/dL (ref 12.0–15.0)
LYMPHS PCT: 29.6 % (ref 12.0–46.0)
Lymphs Abs: 2.1 10*3/uL (ref 0.7–4.0)
MCHC: 33.6 g/dL (ref 30.0–36.0)
MCV: 86.4 fl (ref 78.0–100.0)
MONOS PCT: 6.5 % (ref 3.0–12.0)
Monocytes Absolute: 0.5 10*3/uL (ref 0.1–1.0)
Neutro Abs: 4.5 10*3/uL (ref 1.4–7.7)
Neutrophils Relative %: 62.1 % (ref 43.0–77.0)
Platelets: 294 10*3/uL (ref 150.0–400.0)
RBC: 5.11 Mil/uL (ref 3.87–5.11)
RDW: 12.9 % (ref 11.5–15.5)
WBC: 7.2 10*3/uL (ref 4.0–10.5)

## 2014-11-04 LAB — COMPREHENSIVE METABOLIC PANEL
ALK PHOS: 70 U/L (ref 39–117)
ALT: 13 U/L (ref 0–35)
AST: 14 U/L (ref 0–37)
Albumin: 4.3 g/dL (ref 3.5–5.2)
BILIRUBIN TOTAL: 0.5 mg/dL (ref 0.2–1.2)
BUN: 13 mg/dL (ref 6–23)
CALCIUM: 9.3 mg/dL (ref 8.4–10.5)
CO2: 26 mEq/L (ref 19–32)
CREATININE: 0.83 mg/dL (ref 0.40–1.20)
Chloride: 105 mEq/L (ref 96–112)
GFR: 82.34 mL/min (ref >60.00)
Glucose, Bld: 93 mg/dL (ref 70–99)
Potassium: 4.3 mEq/L (ref 3.5–5.1)
Sodium: 138 mEq/L (ref 135–145)
TOTAL PROTEIN: 7.9 g/dL (ref 6.0–8.3)

## 2014-11-04 LAB — AMYLASE: Amylase: 19 U/L — ABNORMAL LOW (ref 27–131)

## 2014-11-04 NOTE — Patient Instructions (Signed)
You have been scheduled for an endoscopy. Please follow written instructions given to you at your visit today. If you use inhalers (even only as needed), please bring them with you on the day of your procedure. Your physician has requested that you go to www.startemmi.com and enter the access code given to you at your visit today. This web site gives a general overview about your procedure. However, you should still follow specific instructions given to you by our office regarding your preparation for the procedure.  Your physician has requested that you go to the basement for the following lab work before leaving today: CBC, CMET, Amylase, Lipase  I appreciate the opportunity to care for you. Gatha Mayer, MD, [facility]

## 2014-11-04 NOTE — Assessment & Plan Note (Addendum)
High likelihood of functional GI d/o w/ fibromyalgia and IBS She needs evaluation with: EGD CBC, CMET, amylase and lipase The risks and benefits as well as alternatives of endoscopic procedure(s) have been discussed and reviewed. All questions answered. The patient agrees to proceed.  Pending results consider duloxetine, repeat sucralfate, increased buspar and remember mirtazipine helps nausea and insomnia

## 2014-11-04 NOTE — Progress Notes (Signed)
Referred by Dr. Birdie Riddle Subjective:    Patient ID: Jaclyn Tucker, female    DOB: 10/29/1975, 39 y.o.   MRN: [id number] Cc: epigastric pain  HPI Since 06/2014 burning and soreness in epigastrium (like I was sucker punched) No clear triggers. Initially helped by carafate and PPi then recurred 1-2 wks sxs then 2-3 3 weeks off. Not affected by eating, bowels - has chronic losose vs ghard stools as long as can remember. No anorexia or early satiety. Started on anxiolytic Tx for anxiety and fibromyalgia 06/2014 also. +++ nausea associated  H pylori IgG negative  Bad spell June ED eval - IV and oral Tx and home  GI ROS o/w negative except some bloating and gas, belching.  Brother w/ Crohn's Grandmother [facility]CA - "Do I have cancer?"  Allergies  Allergen Reactions  . Penicillins     REACTION: swelling  . Sulfate Rash  . Flexeril [Cyclobenzaprine] Swelling  . Moxifloxacin     REACTION: nausea  . Sumatriptan Succinate     REACTION: nausea, dizziness, panic symptoms   Outpatient Prescriptions Prior to Visit  Medication Sig Dispense Refill  . ALPRAZolam (XANAX) 0.5 MG tablet Take 0.5 mg by mouth daily as needed.     Marland Kitchen aspirin-acetaminophen-caffeine (EXCEDRIN MIGRAINE) 250-250-65 MG per tablet Take 1 tablet by mouth every 6 (six) hours as needed for headache.    Marland Kitchen HYDROcodone-acetaminophen (NORCO) 10-325 MG per tablet Take 1 tablet by mouth every 8 (eight) hours as needed. 30 tablet 0  . pantoprazole (PROTONIX) 40 MG tablet Take 1 tablet (40 mg total) by mouth daily. 60 tablet 1  . traZODone (DESYREL) 50 MG tablet Take 0.5-1 tablets (25-50 mg total) by mouth at bedtime as needed for sleep. 30 tablet 3  . citalopram (CELEXA) 10 MG tablet Take 1 tablet (10 mg total) by mouth daily. 30 tablet 3  . cyclobenzaprine (FLEXERIL) 10 MG tablet     . gabapentin (NEURONTIN) 100 MG capsule Take 1 capsule (100 mg total) by mouth as directed. One every 8 hours as needed 60 capsule 1  . ibuprofen (ADVIL,MOTRIN)  200 MG tablet Take 200 mg by mouth every 6 (six) hours as needed.    . ondansetron (ZOFRAN ODT) 4 MG disintegrating tablet Take 1 tablet (4 mg total) by mouth every 8 (eight) hours as needed for nausea or vomiting. 30 tablet 0  . sucralfate (CARAFATE) 1 G tablet Take 1 tablet (1 g total) by mouth 4 (four) times daily -  with meals and at bedtime. 90 tablet 0   No facility-administered medications prior to visit.   Past Medical History  Diagnosis Date  . Anxiety   . Migraine   . GERD (gastroesophageal reflux disease)   . Vitamin B12 deficiency   . Depression   . Fibromyalgia 06/2014  . IBS (irritable bowel syndrome) 11/04/2014   Past Surgical History  Procedure Laterality Date  . G2 p2     Social History   Social History  . Marital Status: Married    Spouse Name: N/A  . Number of Children: 2  . Years of Education: N/A   Occupational History  . customer service    Social History Main Topics  . Smoking status: Former Smoker    Types: Cigarettes    Quit date: 04/13/2014  . Smokeless tobacco: Never Used     Comment: 1 cig/every now and then  . Alcohol Use: 0.0 oz/week    0 Standard drinks or equivalent per week  Comment: 1-2 per month  . Drug Use: No  . Sexual Activity: Yes   Other Topics Concern  . None   Social History Narrative   Marries   2 sons 1999 and 2008   Customer relations Physiological scientist since 2006   No caffeine   11/04/2014      Family History  Problem Relation Age of Onset  . Diabetes Mother   . Hypertension Father   . Colon cancer Paternal Grandmother   . Breast cancer Paternal Grandmother   . Crohn's disease Brother     HPI as above   Review of Systems As above + allergy/sinus Back pain Depressive sxs, anxierty WORK STRESS, fatigue, headaches, insomnia All other ROS neg    Objective:   Physical Exam @BP  102/80 mmHg  Pulse 84  Ht 5\' 4"  (1.626 m)  Wt 169 lb 4 oz (76.771 kg)  BMI 29.04 kg/m2  LMP 10/13/2014@  General:    Well-developed, well-nourished and in no acute distress Eyes:  anicteric. ENT:   Mouth and posterior pharynx free of lesions.  Neck:   supple w/o thyromegaly or mass.  Lungs: Clear to auscultation bilaterally. Heart:  S1S2, no rubs, murmurs, gallops. Abdomen:  soft, non-tender, no hepatosplenomegaly, hernia, or mass and BS+.  Lymph:  no cervical or supraclavicular adenopathy. Extremities:   no edema, cyanosis or clubbing Skin   no rash. Neuro:  A&O x 3.  Psych:  appropriate mood and  Affect.   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 All questions answered. The patient agrees to proceed.  Pending results consider duloxetine, repeat sucralfate, increased buspar and remember mirtazipine helps nausea and insomnia  IBS (irritable bowel syndrome) Classic pattern of this I think and fits w/ overall functional GI d/o  Anxiety state Contributing to her sxs or concern about them  I appreciate the opportunity to care for this patient. YN:[id number] Birdie Riddle, MD

## 2014-11-04 NOTE — Assessment & Plan Note (Signed)
Classic pattern of this I think and fits w/ overall functional GI d/o

## 2014-11-04 NOTE — Assessment & Plan Note (Signed)
Contributing to her sxs or concern about them

## 2014-11-05 ENCOUNTER — Encounter: Payer: Self-pay | Admitting: Internal Medicine

## 2014-11-05 NOTE — Progress Notes (Signed)
Quick Note:  Labs ok My Chart message ______

## 2014-11-10 ENCOUNTER — Encounter: Payer: Self-pay | Admitting: Internal Medicine

## 2014-11-19 ENCOUNTER — Encounter: Payer: Self-pay | Admitting: Internal Medicine

## 2014-11-19 ENCOUNTER — Ambulatory Visit (AMBULATORY_SURGERY_CENTER): Payer: BLUE CROSS/BLUE SHIELD | Admitting: Internal Medicine

## 2014-11-19 VITALS — BP 112/71 | HR 65 | Temp 98.1°F | Resp 13 | Ht 64.0 in | Wt 169.0 lb

## 2014-11-19 DIAGNOSIS — R1013 Epigastric pain: Secondary | ICD-10-CM

## 2014-11-19 DIAGNOSIS — K317 Polyp of stomach and duodenum: Secondary | ICD-10-CM

## 2014-11-19 DIAGNOSIS — K2950 Unspecified chronic gastritis without bleeding: Secondary | ICD-10-CM | POA: Diagnosis not present

## 2014-11-19 MED ORDER — SODIUM CHLORIDE 0.9 % IV SOLN: 500.0000 mL | INTRAVENOUS | Status: DC

## 2014-11-19 NOTE — Patient Instructions (Addendum)
You had some polyps in the stomach that are not likely to be causing symptoms - biopsies taken to evaluate them. I also took biopsies of other area in stomach to look for inflammation.  We will call with results, recommendations and follow-up plans  I appreciate the opportunity to care for you.  Gatha Mayer, MD, FACG  YOU HAD AN ENDOSCOPIC PROCEDURE TODAY AT [facility]:   Refer to the procedure report that was given to you for any specific questions about what was found during the examination.  If the procedure report does not answer your questions, please call your gastroenterologist to clarify.  If you requested that your care partner not be given the details of your procedure findings, then the procedure report has been included in a sealed envelope for you to review at your convenience later.  YOU SHOULD EXPECT: Some feelings of bloating in the abdomen. Passage of more gas than usual.  Walking can help get rid of the air that was put into your GI tract during the procedure and reduce the bloating. If you had a lower endoscopy (such as a colonoscopy or flexible sigmoidoscopy) you may notice spotting of blood in your stool or on the toilet paper. If you underwent a bowel prep for your procedure, you may not have a normal bowel movement for a few days.  Please Note:  You might notice some irritation and congestion in your nose or some drainage.  This is from the oxygen used during your procedure.  There is no need for concern and it should clear up in a day or so.  SYMPTOMS TO REPORT IMMEDIATELY:   Following upper endoscopy (EGD)  Vomiting of blood or coffee ground material  New chest pain or pain under the shoulder blades  Painful or persistently difficult swallowing  New shortness of breath  Fever of 100F or higher  Black, tarry-looking stools  For urgent or emergent issues, a gastroenterologist can be reached at any hour by calling [phone]  [phone].   DIET: Your first meal following the procedure should be a small meal and then it is ok to progress to your normal diet. Heavy or fried foods are harder to digest and may make you feel nauseous or bloated.  Likewise, meals heavy in dairy and vegetables can increase bloating.  Drink plenty of fluids but you should avoid alcoholic beverages for 24 hours.  ACTIVITY:  You should plan to take it easy for the rest of today and you should NOT DRIVE or use heavy machinery until tomorrow (because of the sedation medicines used during the test).    FOLLOW UP: Our staff will call the number listed on your records the next business day following your procedure to check on you and address any questions or concerns that you may have regarding the information given to you following your procedure. If we do not reach you, we will leave a message.  However, if you are feeling well and you are not experiencing any problems, there is no need to return our call.  We will assume that you have returned to your regular daily activities without incident.  If any biopsies were taken you will be contacted by phone or by letter within the next 1-3 weeks.  Please call us at [phone] if you have not heard about the biopsies in 3 weeks.    SIGNATURES/CONFIDENTIALITY: You and/or your care partner have signed paperwork which will be entered into your electronic medical  record.  These signatures attest to the fact that that the information above on your After Visit Summary has been reviewed and is understood.  Full responsibility of the confidentiality of this discharge information lies with you and/or your care-partner. 

## 2014-11-19 NOTE — Progress Notes (Signed)
Called to room to assist during endoscopic procedure.  Patient ID and intended procedure confirmed with present staff. Received instructions for my participation in the procedure from the performing physician.  

## 2014-11-19 NOTE — Progress Notes (Signed)
  [facility] Post-op Note  Patient: Jaclyn Tucker  Procedure(s) Performed: endoscopy  Patient Location: [facility] - Recovery Area  Anesthesia Type: Deep Sedation/Propofol  Level of Consciousness: awake, oriented and patient cooperative  Airway and Oxygen Therapy: Patient Spontanous Breathing  Post-op Pain: none  Post-op Assessment:  Post-op Vital signs reviewed, Patient's Cardiovascular Status Stable, Respiratory Function Stable, Patent Airway, No signs of Nausea or vomiting and Pain level controlled  Post-op Vital Signs: Reviewed and stable  Complications: No apparent anesthesia complications  Merary Garguilo E 3:49 PM

## 2014-11-19 NOTE — Op Note (Signed)
[place]  [address]. [place], [address]   ENDOSCOPY PROCEDURE REPORT  PATIENT: Jaclyn Tucker, Jaclyn Tucker  MR#: [id number] BIRTHDATE: [birth year] , 38  yrs. old GENDER: female ENDOSCOPIST: Gatha Mayer, MD, [facility] PROCEDURE DATE:  11/19/2014 PROCEDURE:  EGD w/ biopsy ASA CLASS:     Class II INDICATIONS:  epigastric pain. MEDICATIONS: Propofol 200 mg IV and Monitored anesthesia care TOPICAL ANESTHETIC: none  DESCRIPTION OF PROCEDURE: After the risks benefits and alternatives of the procedure were thoroughly explained, informed consent was obtained.  The LB [id number] endoscope was introduced through the mouth and advanced to the second portion of the duodenum , Without limitations.  The instrument was slowly withdrawn as the mucosa was fully examined.    1) Multiple soft, fleshy subcentimeter gastric polyps - body and fundus.  Biopsies taken 2) Otherwise normal - antral biopsies taken given epigastric pain, ? occult gastritis.  Retroflexed views revealed as previously described.     The scope was then withdrawn from the patient and the procedure completed.  COMPLICATIONS: There were no immediate complications.  ENDOSCOPIC IMPRESSION: 1) Multiple soft, fleshy subcentimeter gastric polyps - body and fundus.  Biopsies taken 2) Otherwise normal - antral biopsies taken given epigastric pain, ? occult gastritis  RECOMMENDATIONS: Office will call with results    eSigned:  Gatha Mayer, MD, [facility] 11/19/2014 3:49 PM    CC:The Patient and Annye Asa, MD

## 2014-11-20 ENCOUNTER — Telehealth: Payer: Self-pay | Admitting: *Deleted

## 2014-11-20 NOTE — Telephone Encounter (Signed)
  Follow up Call-  Call back number 11/19/2014  Post procedure Call Back phone  # [phone]  Permission to leave phone message Yes     Patient questions:  Do you have a fever, pain , or abdominal swelling? No. Pain Score  0 *  Have you tolerated food without any problems? Yes.    Have you been able to return to your normal activities? Yes.    Do you have any questions about your discharge instructions: Diet   No. Medications  No. Follow up visit  No.  Do you have questions or concerns about your Care? No.  Actions: * If pain score is 4 or above: No action needed, pain <4. Pt c/o having some discomfort in upper back last night that she thought may have been "gas" . Encouraged pt to call us back if doesn't continue to get better.

## 2014-11-24 NOTE — Progress Notes (Signed)
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

## 2014-11-26 ENCOUNTER — Encounter: Payer: Self-pay | Admitting: Internal Medicine

## 2014-12-03 ENCOUNTER — Ambulatory Visit: Payer: BLUE CROSS/BLUE SHIELD

## 2014-12-03 DIAGNOSIS — E538 Deficiency of other specified B group vitamins: Secondary | ICD-10-CM

## 2015-01-08 ENCOUNTER — Ambulatory Visit (INDEPENDENT_AMBULATORY_CARE_PROVIDER_SITE_OTHER): Payer: BLUE CROSS/BLUE SHIELD

## 2015-01-08 DIAGNOSIS — Z23 Encounter for immunization: Secondary | ICD-10-CM

## 2015-01-08 DIAGNOSIS — E538 Deficiency of other specified B group vitamins: Secondary | ICD-10-CM

## 2015-01-08 MED ORDER — CYANOCOBALAMIN 1000 MCG/ML IJ SOLN
1000.0000 ug | Freq: Once | INTRAMUSCULAR | Status: AC
  Administered 2015-01-08: 1000 ug via INTRAMUSCULAR

## 2015-01-08 MED ORDER — VITAMIN B-12 100 MCG PO TABS: 1000.0000 ug | ORAL_TABLET | Freq: Once | ORAL | Status: DC

## 2015-02-10 ENCOUNTER — Ambulatory Visit (INDEPENDENT_AMBULATORY_CARE_PROVIDER_SITE_OTHER): Payer: BLUE CROSS/BLUE SHIELD | Admitting: Behavioral Health

## 2015-02-10 DIAGNOSIS — E538 Deficiency of other specified B group vitamins: Secondary | ICD-10-CM | POA: Diagnosis not present

## 2015-02-10 MED ORDER — CYANOCOBALAMIN 1000 MCG/ML IJ SOLN
1000.0000 ug | Freq: Once | INTRAMUSCULAR | Status: AC
  Administered 2015-02-10: 1000 ug via INTRAMUSCULAR

## 2015-02-10 NOTE — Progress Notes (Signed)
Pre visit review using our clinic review tool, if applicable. No additional management support is needed unless otherwise documented below in the visit note.  Patient tolerated injection well.  Patient will call office at a later date to schedule the next injection.

## 2015-02-11 ENCOUNTER — Ambulatory Visit: Payer: BLUE CROSS/BLUE SHIELD | Admitting: Internal Medicine

## 2015-03-05 ENCOUNTER — Ambulatory Visit (INDEPENDENT_AMBULATORY_CARE_PROVIDER_SITE_OTHER): Payer: BLUE CROSS/BLUE SHIELD | Admitting: Family Medicine

## 2015-03-05 ENCOUNTER — Ambulatory Visit (HOSPITAL_BASED_OUTPATIENT_CLINIC_OR_DEPARTMENT_OTHER)
Admission: RE | Admit: 2015-03-05 | Discharge: 2015-03-05 | Disposition: A | Discharge status: Home / Self Care | Payer: BLUE CROSS/BLUE SHIELD | Source: Ambulatory Visit | Attending: Family Medicine | Admitting: Family Medicine

## 2015-03-05 ENCOUNTER — Encounter: Payer: Self-pay | Admitting: Family Medicine

## 2015-03-05 VITALS — BP 120/76 | HR 89 | Temp 98.0°F | Resp 16 | Ht 64.0 in | Wt 181.4 lb

## 2015-03-05 DIAGNOSIS — G4451 Hemicrania continua: Secondary | ICD-10-CM | POA: Insufficient documentation

## 2015-03-05 DIAGNOSIS — R51 Headache: Secondary | ICD-10-CM | POA: Diagnosis present

## 2015-03-05 MED ORDER — METHOCARBAMOL 750 MG PO TABS: 750.0000 mg | ORAL_TABLET | Freq: Three times a day (TID) | ORAL | Status: DC

## 2015-03-05 NOTE — Patient Instructions (Signed)
Follow up as needed We'll notify you of your CT results Drink plenty of fluids Continue Ibuprofen for pain and inflammation Use the Robaxin for neck spasm- may cause some drowsiness If no improvement, please let me know so we can send you to neuro If worsening, please go to ER Call with any questions or concerns If you want to join [place] at the new [place] office, any scheduled appointments will automatically transfer and we will see you at [address] [place] [address], [place], [place] [address] ([place]) Happy Holidays!!!

## 2015-03-05 NOTE — Assessment & Plan Note (Signed)
Pt has hx of migraines but reports this is not similar.  Her description of HA is more consistent w/ tension HA due to trap spasm.  Due to L hemi HA w/ visual changes and subjective weakness of L side, will get CT scan to assess.  Pt does not want triptan due to poor reaction to Imitrex (caused panic attack).  Declines Toradol here in office.  Prefers to continue w/ Ibuprofen 800mg  and add Robaxin PRN.  Reviewed supportive care and red flags that should prompt return.  Pt expressed understanding and is in agreement w/ plan.

## 2015-03-05 NOTE — Progress Notes (Signed)
   Subjective:    Patient ID: Jaclyn Tucker, female    DOB: 01-18-1976, 39 y.o.   MRN: [id number]  HPI HA- sxs started ~1 week ago behind L eye.  Pain radiates around head, down into neck and around to back of head.  'last night it felt like this side of my head was going to explode'.  Pt is having pain in L trap and feels as if she has decreased grip in L hand.  Pt denies increased stress recently.  Pt went to chiropractor w/o relief, 'it made it worse'.  No relief w/ ice, ibuprofen.  Does not feel similar to previous migraines.  Denies nausea w/ headache but has sensation of needing to have BM.  Intermittent blurry vision in L eye.   Review of Systems For ROS see HPI     Objective:   Physical Exam  Constitutional: She is oriented to person, place, and time. She appears well-developed and well-nourished. No distress.  HENT:  Head: Normocephalic and atraumatic.  TMs WNL No TTP over sinuses Minimal nasal congestion  Eyes: Conjunctivae and EOM are normal. Pupils are equal, round, and reactive to light.  Neck: Normal range of motion. Neck supple.  L trap spasm  Cardiovascular: Normal rate, regular rhythm, normal heart sounds and intact distal pulses.   Pulmonary/Chest: Effort normal and breath sounds normal. No respiratory distress. She has no wheezes. She has no rales.  Lymphadenopathy:    She has no cervical adenopathy.  Neurological: She is alert and oriented to person, place, and time. She has normal reflexes. No cranial nerve deficit. Coordination normal.  Psychiatric: She has a normal mood and affect. Her behavior is normal. Judgment and thought content normal.  Vitals reviewed.         Assessment & Plan:

## 2015-03-05 NOTE — Progress Notes (Signed)
Pre visit review using our clinic review tool, if applicable. No additional management support is needed unless otherwise documented below in the visit note. 

## 2015-03-09 ENCOUNTER — Encounter: Payer: Self-pay | Admitting: Family Medicine

## 2015-03-09 DIAGNOSIS — G43809 Other migraine, not intractable, without status migrainosus: Secondary | ICD-10-CM

## 2015-03-09 NOTE — Telephone Encounter (Signed)
Referral placed.

## 2015-03-10 ENCOUNTER — Encounter: Payer: Self-pay | Admitting: Family Medicine

## 2015-03-11 ENCOUNTER — Ambulatory Visit: Payer: BLUE CROSS/BLUE SHIELD

## 2015-03-11 ENCOUNTER — Ambulatory Visit (INDEPENDENT_AMBULATORY_CARE_PROVIDER_SITE_OTHER): Payer: BLUE CROSS/BLUE SHIELD

## 2015-03-11 DIAGNOSIS — D518 Other vitamin B12 deficiency anemias: Secondary | ICD-10-CM

## 2015-03-11 MED ORDER — CYANOCOBALAMIN 1000 MCG/ML IJ SOLN
1000.0000 ug | Freq: Once | INTRAMUSCULAR | Status: AC
  Administered 2015-03-11: 1000 ug via INTRAMUSCULAR

## 2015-03-11 NOTE — Progress Notes (Signed)
Ed to return inre visit review using our clinic review tool, if applicable. No additional management support is needed unless otherwise documented below in the visit note.  Patient in for B 12 injection. Given IM Right arm. Patient tolerated well. No complaints voiced. Patient scheduled to return in 4 weeks.

## 2015-03-16 ENCOUNTER — Encounter: Payer: Self-pay | Admitting: Family Medicine

## 2015-03-18 ENCOUNTER — Emergency Department (HOSPITAL_COMMUNITY)
Admission: EM | Admit: 2015-03-18 | Discharge: 2015-03-18 | Disposition: A | Discharge status: Home / Self Care | Payer: BLUE CROSS/BLUE SHIELD | Source: Home / Self Care | Attending: Family Medicine | Admitting: Family Medicine

## 2015-03-18 ENCOUNTER — Telehealth: Payer: Self-pay | Admitting: Family Medicine

## 2015-03-18 ENCOUNTER — Encounter (HOSPITAL_COMMUNITY): Payer: Self-pay | Admitting: *Deleted

## 2015-03-18 DIAGNOSIS — J3489 Other specified disorders of nose and nasal sinuses: Secondary | ICD-10-CM

## 2015-03-18 MED ORDER — AZITHROMYCIN 250 MG PO TABS: ORAL_TABLET | ORAL | Status: DC

## 2015-03-18 MED ORDER — IPRATROPIUM BROMIDE 0.06 % NA SOLN: 2.0000 | Freq: Four times a day (QID) | NASAL | Status: DC

## 2015-03-18 NOTE — ED Notes (Signed)
Pt  Reports  Facial  Pain   And  Sinus  Drainage      -      With  Symptoms        X  3   Weeks   She  States   She  Was   Struck       In      Nose  By   Her   Dog  Around  The  Symptoms          Pt    Reports     Symptoms

## 2015-03-18 NOTE — ED Provider Notes (Signed)
CSN: [id number]     Arrival date & time 03/18/15  1730 History   First MD Initiated Contact with Patient 03/18/15 1915     Chief Complaint  Patient presents with  . Facial Pain   (Consider location/radiation/quality/duration/timing/severity/associated sxs/prior Treatment) Patient is a 40 y.o. female presenting with URI. The history is provided by the patient.  URI Presenting symptoms: congestion, facial pain and rhinorrhea   Presenting symptoms: no fever   Severity:  Moderate Onset quality:  Gradual Duration:  3 weeks Progression:  Unchanged Chronicity:  New Relieved by:  Nothing Associated symptoms: sinus pain   Risk factors: recent illness   Risk factors: no sick contacts     Past Medical History  Diagnosis Date  . Anxiety   . Migraine   . GERD (gastroesophageal reflux disease)   . Vitamin B12 deficiency   . Depression   . Fibromyalgia 06/2014  . IBS (irritable bowel syndrome) 11/04/2014   Past Surgical History  Procedure Laterality Date  . G2 p2     Family History  Problem Relation Age of Onset  . Diabetes Mother   . Hypertension Father   . Colon cancer Paternal Grandmother   . Breast cancer Paternal Grandmother   . Crohn's disease Brother    Social History  Substance Use Topics  . Smoking status: Former Smoker    Types: Cigarettes    Quit date: 04/13/2014  . Smokeless tobacco: Never Used     Comment: 1 cig/every now and then  . Alcohol Use: 0.0 oz/week    0 Standard drinks or equivalent per week     Comment: 1-2 per month   OB History    No data available     Review of Systems  Constitutional: Negative.  Negative for fever.  HENT: Positive for congestion and rhinorrhea. Negative for dental problem, facial swelling and postnasal drip.   All other systems reviewed and are negative.   Allergies  Penicillins; Quinolones; Sulfate; Flexeril; Moxifloxacin; and Sumatriptan succinate  Home Medications   Prior to Admission medications   Medication Sig  Start Date End Date Taking? Authorizing Provider  ALPRAZolam Duanne Moron) 0.5 MG tablet Take 0.5 mg by mouth daily as needed.  05/13/14   Historical Provider, MD  aspirin-acetaminophen-caffeine (EXCEDRIN MIGRAINE) [phone] MG per tablet Take 1 tablet by mouth every 6 (six) hours as needed for headache.    Historical Provider, MD  azithromycin (ZITHROMAX Z-PAK) 250 MG tablet Take as directed on pack 03/18/15   Billy Fischer, MD  busPIRone (BUSPAR) 5 MG tablet Take 1 tablet by mouth 2 (two) times daily. 09/30/14   Historical Provider, MD  ipratropium (ATROVENT) 0.06 % nasal spray Place 2 sprays into both nostrils 4 (four) times daily. 03/18/15   Billy Fischer, MD  methocarbamol (ROBAXIN) 750 MG tablet Take 1 tablet (750 mg total) by mouth 3 (three) times daily. 03/05/15   Midge Minium, MD  pantoprazole (PROTONIX) 40 MG tablet Take 1 tablet (40 mg total) by mouth daily. 07/23/14   Midge Minium, MD  sertraline (ZOLOFT) 50 MG tablet Take 1 tablet by mouth daily. 09/12/14   Historical Provider, MD  traZODone (DESYREL) 50 MG tablet Take 0.5-1 tablets (25-50 mg total) by mouth at bedtime as needed for sleep. 07/23/14   Midge Minium, MD   Meds Ordered and Administered this Visit  Medications - No data to display  BP 143/87 mmHg  Pulse 80  Temp(Src) 98.6 F (37 C) (Oral)  Resp 16  SpO2 98%  LMP 03/05/2015 No data found.   Physical Exam  Constitutional: She is oriented to person, place, and time. She appears well-developed and well-nourished. No distress.  HENT:  Head: Normocephalic.  Right Ear: External ear normal.  Left Ear: External ear normal.  Nose: Nose normal.  Mouth/Throat: Oropharynx is clear and moist.  Eyes: Pupils are equal, round, and reactive to light.  Neck: Normal range of motion. Neck supple.  Lymphadenopathy:    She has no cervical adenopathy.  Neurological: She is alert and oriented to person, place, and time.  Skin: Skin is warm and dry.  Nursing note and vitals  reviewed.   ED Course  Procedures (including critical care time)  Labs Review Labs Reviewed - No data to display  Imaging Review No results found.   Visual Acuity Review  Right Eye Distance:   Left Eye Distance:   Bilateral Distance:    Right Eye Near:   Left Eye Near:    Bilateral Near:         MDM   1. Sinus pain        Billy Fischer, MD 03/18/15 2045

## 2015-03-18 NOTE — Telephone Encounter (Signed)
Patient states she has sinus infection and would like to be seen preferable today advised no availability patient is available at 4pm 03/18/14 may I use that slot. Please advise

## 2015-03-18 NOTE — Telephone Encounter (Signed)
If appt is still available, okay to schedule.

## 2015-03-19 NOTE — Telephone Encounter (Signed)
No Voicemail

## 2015-03-25 ENCOUNTER — Other Ambulatory Visit: Payer: Self-pay | Admitting: Obstetrics and Gynecology

## 2015-03-26 LAB — CYTOLOGY - PAP

## 2015-04-08 ENCOUNTER — Other Ambulatory Visit (INDEPENDENT_AMBULATORY_CARE_PROVIDER_SITE_OTHER): Payer: BLUE CROSS/BLUE SHIELD

## 2015-04-08 ENCOUNTER — Encounter: Payer: Self-pay | Admitting: Neurology

## 2015-04-08 ENCOUNTER — Ambulatory Visit (INDEPENDENT_AMBULATORY_CARE_PROVIDER_SITE_OTHER): Payer: BLUE CROSS/BLUE SHIELD | Admitting: Neurology

## 2015-04-08 VITALS — BP 136/86 | HR 96 | Ht 64.0 in | Wt 180.0 lb

## 2015-04-08 DIAGNOSIS — G4452 New daily persistent headache (NDPH): Secondary | ICD-10-CM

## 2015-04-08 DIAGNOSIS — M791 Myalgia, unspecified site: Secondary | ICD-10-CM

## 2015-04-08 DIAGNOSIS — R768 Other specified abnormal immunological findings in serum: Secondary | ICD-10-CM

## 2015-04-08 DIAGNOSIS — G43009 Migraine without aura, not intractable, without status migrainosus: Secondary | ICD-10-CM | POA: Diagnosis not present

## 2015-04-08 LAB — C-REACTIVE PROTEIN: CRP: 0.1 mg/dL — ABNORMAL LOW (ref 0.5–20.0)

## 2015-04-08 LAB — RHEUMATOID FACTOR

## 2015-04-08 LAB — SEDIMENTATION RATE: SED RATE: 15 mm/h (ref 0–22)

## 2015-04-08 LAB — CK: Total CK: 68 U/L (ref 7–177)

## 2015-04-08 LAB — MAGNESIUM: MAGNESIUM: 2.1 mg/dL (ref 1.5–2.5)

## 2015-04-08 NOTE — Telephone Encounter (Signed)
Was able to add Magnesium to lab orders.

## 2015-04-08 NOTE — Progress Notes (Signed)
NEUROLOGY CONSULTATION NOTE  Jaclyn Tucker MRN: [id number] DOB: 22-Jul-1975  Referring provider: Dr. Birdie Riddle Primary care provider: Dr. Birdie Riddle  Reason for consult:  Headache, myalgias, muscle twitches  HISTORY OF PRESENT ILLNESS: Jaclyn Tucker is a 40 year old right-handed female who presents for migraine.  History obtained by patient and PCP notes.  Imaging of head CT reviewed.  In March-April 2016, she developed neck pain into the shoulders, as well as headache from the back of her head, radiating to either side of her head.  It was a pounding and stabbing pain.  It was associated with osmophobia, photophobia and phonophobia.  Occasionally she noted nausea but no visual disturbance.  It was constant and fluctuated in intensity, severe about every 6 days.  She treated it with ibuprofen and Excedrin Migraine.  Headaches subsequently resolved.  Last month, she developed the headache again just prior to Christmas, and it was constant until about 2 weeks ago, when it spontaneously resolved.  Since then, she reports that her limbs feel heavy but no actual weakness.  She reports myalgias.  She reports muscle twitches involving the leg, thigh, calf and hands.  She denies double vision, dysphagia or difficulty breathing.  She reports numbness and tingling in her hands and arms if she is sitting with elbows on the armrests, which she has to shake out.   She has been treated for B12 deficiency.  Level was 259 last year, when symptoms first started.  She has been receiving monthly shots since then.  Recently, she had a positive ANA which was 1:80.  She reports prior history of ice-pick headaches.  She denies family history of neurologic disease.  PAST MEDICAL HISTORY: Past Medical History  Diagnosis Date  . Anxiety   . Migraine   . GERD (gastroesophageal reflux disease)   . Vitamin B12 deficiency   . Depression   . Fibromyalgia 06/2014  . IBS (irritable bowel syndrome) 11/04/2014    PAST  SURGICAL HISTORY: Past Surgical History  Procedure Laterality Date  . G2 p2      MEDICATIONS: Current Outpatient Prescriptions on File Prior to Visit  Medication Sig Dispense Refill  . ALPRAZolam (XANAX) 0.5 MG tablet Take 0.5 mg by mouth daily as needed.     Marland Kitchen aspirin-acetaminophen-caffeine (EXCEDRIN MIGRAINE) 250-250-65 MG per tablet Take 1 tablet by mouth every 6 (six) hours as needed for headache.    . busPIRone (BUSPAR) 5 MG tablet Take 1 tablet by mouth 2 (two) times daily.    Marland Kitchen ipratropium (ATROVENT) 0.06 % nasal spray Place 2 sprays into both nostrils 4 (four) times daily. 15 mL 1  . methocarbamol (ROBAXIN) 750 MG tablet Take 1 tablet (750 mg total) by mouth 3 (three) times daily. 45 tablet 0  . sertraline (ZOLOFT) 50 MG tablet Take 1 tablet by mouth daily.     No current facility-administered medications on file prior to visit.    ALLERGIES: Allergies  Allergen Reactions  . Penicillins     REACTION: swelling  . Quinolones Anaphylaxis and Palpitations    REACTION: nausea  . Sulfate Rash  . Flexeril [Cyclobenzaprine] Swelling  . Moxifloxacin     REACTION: nausea  . Sumatriptan Succinate     REACTION: nausea, dizziness, panic symptoms    FAMILY HISTORY: Family History  Problem Relation Age of Onset  . Diabetes Mother   . Hypertension Father   . Colon cancer Paternal Grandmother   . Breast cancer Paternal Grandmother   . Crohn's disease Brother  SOCIAL HISTORY: Social History   Social History  . Marital Status: Married    Spouse Name: N/A  . Number of Children: 2  . Years of Education: N/A   Occupational History  . customer service    Social History Main Topics  . Smoking status: Former Smoker    Types: Cigarettes    Quit date: 04/13/2014  . Smokeless tobacco: Never Used     Comment: 1 cig/every now and then  . Alcohol Use: 0.0 oz/week    0 Standard drinks or equivalent per week     Comment: 1-2 per month  . Drug Use: No  . Sexual Activity:  Yes   Other Topics Concern  . Not on file   Social History Narrative   Marries   2 sons 1999 and 2008   Customer relations Physiological scientist since 2006   No caffeine   11/04/2014       REVIEW OF SYSTEMS: Constitutional: No fevers, chills, or sweats, no generalized fatigue, change in appetite Eyes: No visual changes, double vision, eye pain Ear, nose and throat: No hearing loss, ear pain, nasal congestion, sore throat Cardiovascular: No chest pain, palpitations Respiratory:  No shortness of breath at rest or with exertion, wheezes GastrointestinaI: No nausea, vomiting, diarrhea, abdominal pain, fecal incontinence Genitourinary:  No dysuria, urinary retention or frequency Musculoskeletal:  Neck tightness Integumentary: No rash, pruritus, skin lesions Neurological: as above Psychiatric: anxiety Endocrine: No palpitations, fatigue, diaphoresis, mood swings, change in appetite, change in weight, increased thirst Hematologic/Lymphatic:  No anemia, purpura, petechiae. Allergic/Immunologic: no itchy/runny eyes, nasal congestion, recent allergic reactions, rashes  PHYSICAL EXAM: Filed Vitals:   04/08/15 0946  BP: 136/86  Pulse: 96   General: No acute distress.  Patient appears well-groomed.  Anxious Head:  Normocephalic/atraumatic Eyes:  fundi unremarkable, without vessel changes, exudates, hemorrhages or papilledema. Neck: supple, no paraspinal tenderness, full range of motion Back: No paraspinal tenderness Heart: regular rate and rhythm Lungs: Clear to auscultation bilaterally. Vascular: No carotid bruits. Neurological Exam: Mental status: alert and oriented to person, place, and time, recent and remote memory intact, fund of knowledge intact, attention and concentration intact, speech fluent and not dysarthric, language intact. Cranial nerves: CN I: not tested CN II: pupils equal, round and reactive to light, visual fields intact, fundi unremarkable, without vessel changes,  exudates, hemorrhages or papilledema. CN III, IV, VI:  full range of motion, no nystagmus, no ptosis CN V: facial sensation intact CN VII: upper and lower face symmetric CN VIII: hearing intact CN IX, X: gag intact, uvula midline CN XI: sternocleidomastoid and trapezius muscles intact CN XII: tongue midline Bulk & Tone: normal, no fasciculations. Motor:  5/5 throughout  Sensation:  Pinprick and vibration sensation intact. Deep Tendon Reflexes:  2+ throughout, toes downgoing.  Finger to nose testing:  Without dysmetria.  Heel to shin:  Without dysmetria.  Gait:  Normal station and stride.  Able to turn and tandem walk. Romberg negative.  IMPRESSION: New daily persistent headache, twice, has since resolved.  Suspect migraines based on semiology. Myalgias, muscle twitch.  Strength intact.  Uncertain etiology.  I don't suspect neuromuscular disease, but will check CK. Positive ANA, nonspecific finding for inflammatory process, may be of uncertain clinical significance.  PLAN: 1.  Wouldn't treat headaches as they have resolved.  She will follow up if they recur.  Consider prednisone taper or headache cocktail to try and abort them if they are persistent. 2.  Will check Sed Rate, CR, RF and  CK   Thank you for allowing me to take part in the care of this patient.  Metta Clines, DO  CC:  Annye Asa, MD

## 2015-04-08 NOTE — Patient Instructions (Signed)
I think the headaches were likely migraines.  If they recur, you may call us for treatment I don't suspect you have ALS or MS.  I would like to check some other blood tests (Sed Rate, CRP, RF, CK).  Will contact you with results and whether any other testing is required. Follow up as needed.

## 2015-04-08 NOTE — Telephone Encounter (Signed)
Pt sent mychart message asking about a magnesium level that you had discussed. Would like to know if she should have PCP check that. Please advise.

## 2015-04-09 ENCOUNTER — Encounter: Payer: Self-pay | Admitting: Family Medicine

## 2015-04-09 ENCOUNTER — Ambulatory Visit: Payer: BLUE CROSS/BLUE SHIELD | Admitting: Internal Medicine

## 2015-04-09 DIAGNOSIS — M62838 Other muscle spasm: Secondary | ICD-10-CM

## 2015-04-09 NOTE — Telephone Encounter (Signed)
Please see mychart message.

## 2015-04-09 NOTE — Progress Notes (Signed)
Mychart message sent with lab results.

## 2015-04-14 ENCOUNTER — Ambulatory Visit (INDEPENDENT_AMBULATORY_CARE_PROVIDER_SITE_OTHER): Payer: BLUE CROSS/BLUE SHIELD | Admitting: *Deleted

## 2015-04-14 DIAGNOSIS — E538 Deficiency of other specified B group vitamins: Secondary | ICD-10-CM

## 2015-04-14 MED ORDER — CYANOCOBALAMIN 1000 MCG/ML IJ SOLN
1000.0000 ug | Freq: Once | INTRAMUSCULAR | Status: AC
  Administered 2015-04-14: 1000 ug via INTRAMUSCULAR

## 2015-04-14 NOTE — Progress Notes (Signed)
Pre visit review using our clinic review tool, if applicable. No additional management support is needed unless otherwise documented below in the visit note.  Pt here for B12 injection. Pt tolerated injection well.   Dorrene German, RN

## 2015-04-15 ENCOUNTER — Other Ambulatory Visit: Payer: Self-pay | Admitting: Neurology

## 2015-04-15 ENCOUNTER — Other Ambulatory Visit (INDEPENDENT_AMBULATORY_CARE_PROVIDER_SITE_OTHER): Payer: BLUE CROSS/BLUE SHIELD

## 2015-04-15 DIAGNOSIS — M62838 Other muscle spasm: Secondary | ICD-10-CM

## 2015-04-15 MED ORDER — BACLOFEN 10 MG PO TABS: 5.0000 mg | ORAL_TABLET | Freq: Three times a day (TID) | ORAL | Status: DC | PRN

## 2015-04-15 NOTE — Addendum Note (Signed)
Addended by: Gerda Diss A on: 04/15/2015 10:10 AM   Modules accepted: Orders

## 2015-04-15 NOTE — Telephone Encounter (Signed)
She can try baclofen 5mg  up to three times daily as needed for muscle spasms. She should be cautious as it may cause drowsiness. So before she drives, she should take it when she knows she will be at home or not driving.   Mallary Kreger, I would also like to check B12 and Lyme

## 2015-04-15 NOTE — Telephone Encounter (Signed)
Please see mychart message from patient and advise. Thank you

## 2015-04-16 LAB — LYME AB/WESTERN BLOT REFLEX: B BURGDORFERI AB IGG+ IGM: 1.33 {ISR} — AB

## 2015-04-16 NOTE — Telephone Encounter (Signed)
Per pt's mychart message, "I also had just one quick question, my twitching has moved into my tongue (I cannot see it, but I feel it when it is resting) as well.Jaclyn KitchenMarland KitchenIs that a cause for concern? I have a lot of anxiety going on with this right now and when something new happens it kinda creeps me out. I am very thankful for all you help with this." Please advise.

## 2015-04-19 ENCOUNTER — Encounter: Payer: Self-pay | Admitting: Family Medicine

## 2015-04-19 ENCOUNTER — Telehealth: Payer: Self-pay | Admitting: Family Medicine

## 2015-04-19 ENCOUNTER — Ambulatory Visit (INDEPENDENT_AMBULATORY_CARE_PROVIDER_SITE_OTHER): Payer: BLUE CROSS/BLUE SHIELD | Admitting: Family Medicine

## 2015-04-19 VITALS — BP 130/80 | HR 89 | Temp 98.8°F | Ht 64.0 in | Wt 173.8 lb

## 2015-04-19 DIAGNOSIS — R110 Nausea: Secondary | ICD-10-CM | POA: Diagnosis not present

## 2015-04-19 DIAGNOSIS — K219 Gastro-esophageal reflux disease without esophagitis: Secondary | ICD-10-CM

## 2015-04-19 DIAGNOSIS — B349 Viral infection, unspecified: Secondary | ICD-10-CM

## 2015-04-19 MED ORDER — ONDANSETRON HCL 4 MG PO TABS: 4.0000 mg | ORAL_TABLET | Freq: Three times a day (TID) | ORAL | Status: DC | PRN

## 2015-04-19 MED ORDER — PROMETHAZINE-DM 6.25-15 MG/5ML PO SYRP: 5.0000 mL | ORAL_SOLUTION | Freq: Four times a day (QID) | ORAL | Status: DC | PRN

## 2015-04-19 MED ORDER — BENZONATATE 200 MG PO CAPS: 200.0000 mg | ORAL_CAPSULE | Freq: Three times a day (TID) | ORAL | Status: DC | PRN

## 2015-04-19 MED ORDER — PANTOPRAZOLE SODIUM 40 MG PO TBEC: 40.0000 mg | DELAYED_RELEASE_TABLET | Freq: Every day | ORAL | Status: DC

## 2015-04-19 NOTE — Telephone Encounter (Signed)
Pt called stating she is shaking and has rapid heart rate, took anxiety meds, worried. Transferred to [facility]

## 2015-04-19 NOTE — Patient Instructions (Signed)
Follow up as needed Take the Zofran as needed for nausea Restart the Protonix for the increased acid and reflux Use the Tessalon for the daytime cough and the syrup for nights/weekends (will cause drowsiness) Drink plenty of fluids REST! Call with any questions or concerns Hang in there!!!

## 2015-04-19 NOTE — Progress Notes (Signed)
   Subjective:    Patient ID: Jaclyn Tucker, female    DOB: 09-02-1975, 40 y.o.   MRN: [id number]  HPI 'i feel awful'- sxs started Friday with reflux sxs.  Woke up shaky, heart racing nauseated, dizzy, 'my insides were trembling'.  Took xanax w/o improvement.  + body aches.  No known fevers.   + mouth watering but no vomiting.  + abd pain but no diarrhea.  + frontal HA.  Bilateral ear pain w/ jaw spasms.  No sinus pain/pressure.  + PND.  + cough- not productive.  No known sick contacts.   Review of Systems For ROS see HPI     Objective:   Physical Exam  Constitutional: She is oriented to person, place, and time. She appears well-developed and well-nourished. No distress.  HENT:  Head: Normocephalic and atraumatic.  Nose: Nose normal.  Mouth/Throat: Oropharynx is clear and moist.  TMs mildly retracted but otherwise WNL No TTP over sinuses  Eyes: Conjunctivae and EOM are normal. Pupils are equal, round, and reactive to light.  Neck: Normal range of motion. Neck supple.  Cardiovascular: Normal rate, regular rhythm, normal heart sounds and intact distal pulses.   Pulmonary/Chest: Effort normal and breath sounds normal. No respiratory distress. She has no wheezes. She has no rales.  Abdominal: Soft. Bowel sounds are normal. She exhibits no distension. There is tenderness (mild TTP over epigastrum). There is no rebound and no guarding.  Lymphadenopathy:    She has no cervical adenopathy.  Neurological: She is alert and oriented to person, place, and time.  Psychiatric: Her behavior is normal. Thought content normal.  anxious  Vitals reviewed.         Assessment & Plan:

## 2015-04-19 NOTE — Assessment & Plan Note (Signed)
New.  Pt has multiple symptoms w/o obvious cause on PE.  Multiple pts recently w/ similar complaints which further supports dx of viral illness.  Will start supportive care- fluids, cough meds, Zofran.  Reviewed supportive care and red flags that should prompt return.  Pt expressed understanding and is in agreement w/ plan.

## 2015-04-19 NOTE — Telephone Encounter (Signed)
[facility] - Client TELEPHONE ADVICE RECORD [facility]  Patient Name: Jaclyn Tucker  DOB: 21-Jul-1975    Initial Comment Caller states her heart is racing and not feeling right. Her stomach is bothering her and she feels weak. She is nauseas and her stomach is bothering her.    Nurse Assessment  Nurse: Wynetta Emery, RN, Baker Janus Date/Time Eilene Ghazi Time): 04/19/2015 8:10:03 AM  Confirm and document reason for call. If symptomatic, describe symptoms. You must click the next button to save text entered. ---Estill Bamberg felt sick to stomach and sweating and weak shakey heart racing (panic attack) took xanax - nauseated onset two days  Has the patient traveled out of the country within the last 30 days? ---No  Does the patient have any new or worsening symptoms? ---Yes  Will a triage be completed? ---Yes  Related visit to physician within the last 2 weeks? ---No  Does the PT have any chronic conditions? (i.e. diabetes, asthma, etc.) ---No  Did the patient indicate they were pregnant? ---No  Is this a behavioral health or substance abuse call? ---No     Guidelines    Guideline Title Affirmed Question Affirmed Notes  Abdominal Pain - Upper [1] MILD-MODERATE pain AND [2] not relieved by antacids    Final Disposition User   See Physician within 4 Hours (or PCP triage) Wynetta Emery, RN, Baker Janus    Comments  [place] Nuse made appt with Dr. Birdie Riddle at 100pm i-- when she went into the schedule it showed 1pm same day i entered the information in and it took the appt when i double checked to see her name was entered into the slot it showed MD request hold then the patients name it did not have MD request hold on it when appt was made if this is not an appt time to be honored can you let Ambika know.   Referrals  REFERRED TO PCP OFFICE   Disagree/Comply: Comply

## 2015-04-19 NOTE — Telephone Encounter (Signed)
Patient seen by Dr. Birdie Riddle today.

## 2015-04-19 NOTE — Assessment & Plan Note (Signed)
New.  Suspect viral illness.  sxs are improving since onset.  Start Zofran prn.  Restart PPI.  If no improvement will get labs for additional w/u.  Reviewed supportive care and red flags that should prompt return.  Pt expressed understanding and is in agreement w/ plan.

## 2015-04-19 NOTE — Assessment & Plan Note (Signed)
Deteriorated since stopping PPI. Suspect that pt has both GERD and increased acid due to decreased appetite in setting of viral illness.  Resume protonix.  Reviewed lifestyle modifications.  Will follow.

## 2015-04-19 NOTE — Progress Notes (Signed)
Pre visit review using our clinic review tool, if applicable. No additional management support is needed unless otherwise documented below in the visit note. 

## 2015-04-19 NOTE — Telephone Encounter (Signed)
Patient in to see Dr. Birdie Riddle today

## 2015-04-22 ENCOUNTER — Encounter: Payer: Self-pay | Admitting: Neurology

## 2015-04-22 LAB — LYME ABY, WSTRN BLT IGG & IGM W/BANDS
B BURGDORFERI IGG ABS (IB): NEGATIVE
B BURGDORFERI IGM ABS (IB): NEGATIVE
LYME DISEASE 18 KD IGG: NONREACTIVE
LYME DISEASE 23 KD IGG: NONREACTIVE
LYME DISEASE 28 KD IGG: NONREACTIVE
LYME DISEASE 30 KD IGG: NONREACTIVE
LYME DISEASE 58 KD IGG: NONREACTIVE
Lyme Disease 23 kD IgM: REACTIVE — AB
Lyme Disease 39 kD IgG: NONREACTIVE
Lyme Disease 39 kD IgM: NONREACTIVE
Lyme Disease 41 kD IgG: NONREACTIVE
Lyme Disease 41 kD IgM: NONREACTIVE
Lyme Disease 45 kD IgG: NONREACTIVE
Lyme Disease 66 kD IgG: NONREACTIVE
Lyme Disease 93 kD IgG: NONREACTIVE

## 2015-04-22 NOTE — Telephone Encounter (Signed)
Patient request Lyme results. Please advise.

## 2015-04-27 ENCOUNTER — Ambulatory Visit (INDEPENDENT_AMBULATORY_CARE_PROVIDER_SITE_OTHER): Payer: BLUE CROSS/BLUE SHIELD | Admitting: Neurology

## 2015-04-27 ENCOUNTER — Telehealth: Payer: Self-pay

## 2015-04-27 DIAGNOSIS — M62838 Other muscle spasm: Secondary | ICD-10-CM | POA: Diagnosis not present

## 2015-04-27 DIAGNOSIS — M791 Myalgia, unspecified site: Secondary | ICD-10-CM

## 2015-04-27 NOTE — Telephone Encounter (Signed)
-----   Message from Jaclyn Berthold, DO sent at 04/27/2015 10:52 AM EST ----- Please inform patient that her nerve and muscle testing is entirely normal.  No evidence of anything worrisome. Thanks.

## 2015-04-27 NOTE — Telephone Encounter (Signed)
Message relayed to patient. Verbalized understanding and denied questions.   

## 2015-04-27 NOTE — Procedures (Signed)
[facility]  [place], [place]  [place], [place] [address] Tel: [phone] Fax:  [phone] Test Date:  04/27/2015  Patient: Jaclyn Tucker DOB: Jul 16, 1975 Physician: Narda Amber, DO  Sex: Female Height: 5\' 4"  Ref Phys: Metta Clines  ID#: [id number] Temp: 33.1C Technician: Jerilynn Mages. Dean   Patient Complaints: This is a 40 year old female referred for evaluation of generalized cramps, muscle twitches, and pain.  NCV & EMG Findings: Extensive electrodiagnostic testing of the right upper and lower extremity shows: 1. Right median, ulnar, and palmar sensory responses are within normal limits. 2. Right median and ulnar motor responses are within normal limits. 3. Right sural and superficial peroneal sensory responses are within normal limits. 4. Right peroneal and tibial motor responses are within normal limits. 5. Right tibial H reflex study is within normal limits. 6. There is no evidence of active or chronic motor axon loss changes affecting any of the tested muscles. Motor unit configuration and recruitment pattern is within normal limits.  Impression: This is a normal study of the right upper and lower extremities. In particular, there is no evidence of diffuse myopathy, cervical/lumbosacral radiculopathy, sensorimotor polyneuropathy, or carpal tunnel syndrome.   ___________________________ Narda Amber, DO    Nerve Conduction Studies Anti Sensory Summary Table   Site NR Peak (ms) Norm Peak (ms) P-T Amp (V) Norm P-T Amp  Right Median Anti Sensory (2nd Digit)  33.1C  Wrist    2.9 <3.4 39.6 >20  Right Sup Peroneal Anti Sensory (Ant Lat Mall)  12 cm    3.1 <4.5 15.7 >5  Right Sural Anti Sensory (Lat Mall)  Calf    3.7 <4.5 20.4 >5  Right Ulnar Anti Sensory (5th Digit)  33.1C  Wrist    2.7 <3.1 51.9 >12   Motor Summary Table   Site NR Onset (ms) Norm Onset (ms) O-P Amp (mV) Norm O-P Amp Site1 Site2 Delta-0 (ms) Dist (cm) Vel (m/s) Norm Vel (m/s)  Right  Median Motor (Abd Poll Brev)  33.1C  Wrist    3.0 <3.9 10.0 >6 Elbow Wrist 3.8 22.0 58 >50  Elbow    6.8  10.0         Right Peroneal Motor (Ext Dig Brev)  Ankle    3.4 <5.5 7.7 >3 B Fib Ankle 6.3 32.0 51 >40  B Fib    9.7  7.0  Poplt B Fib 1.6 10.0 62 >40  Poplt    11.3  7.0         Right Tibial Motor (Abd Hall Brev)  33.1C  Ankle    4.1 <6.0 13.9 >8 Knee Ankle 7.5 38.0 51 >40  Knee    11.6  10.8         Right Ulnar Motor (Abd Dig Minimi)  33.1C  Wrist    2.7 <3.1 10.2 >7 B Elbow Wrist 3.0 19.0 63 >50  B Elbow    5.7  10.0  A Elbow B Elbow 1.3 9.5 73 >50  A Elbow    7.0  10.0          Comparison Summary Table   Site NR Peak (ms) Norm Peak (ms) P-T Amp (V) Site1 Site2 Delta-P (ms) Norm Delta (ms)  Right Median/Ulnar Palm Comparison (Wrist - 8cm)  33.1C  Median Palm    1.8 <2.2 130.6 Median Palm Ulnar Palm 0.0   Ulnar Palm    1.8 <2.2 32.1       H Reflex Studies   NR H-Lat (  ms) Lat Norm (ms) L-R H-Lat (ms) M-Lat (ms) HLat-MLat (ms)  Right Tibial (Gastroc)     29.12 <35  3.95 25.17   EMG   Side Muscle Ins Act Fibs Psw Fasc Number Recrt Dur Dur. Amp Amp. Poly Poly. Comment  Right 1stDorInt Nml Nml Nml Nml Nml Nml Nml Nml Nml Nml Nml Nml N/A  Right AntTibialis Nml Nml Nml Nml Nml Nml Nml Nml Nml Nml Nml Nml N/A  Right Gastroc Nml Nml Nml Nml Nml Nml Nml Nml Nml Nml Nml Nml N/A  Right Flex Dig Long Nml Nml Nml Nml Nml Nml Nml Nml Nml Nml Nml Nml N/A  Right RectFemoris Nml Nml Nml Nml Nml Nml Nml Nml Nml Nml Nml Nml N/A  Right GluteusMed Nml Nml Nml Nml Nml Nml Nml Nml Nml Nml Nml Nml N/A  Right PronatorTeres Nml Nml Nml Nml Nml Nml Nml Nml Nml Nml Nml Nml N/A  Right Biceps Nml Nml Nml Nml Nml Nml Nml Nml Nml Nml Nml Nml N/A  Right Triceps Nml Nml Nml Nml Nml Nml Nml Nml Nml Nml Nml Nml N/A  Right Deltoid Nml Nml Nml Nml Nml Nml Nml Nml Nml Nml Nml Nml N/A      Waveforms:

## 2015-04-29 ENCOUNTER — Encounter: Payer: Self-pay | Admitting: Family Medicine

## 2015-04-29 DIAGNOSIS — M353 Polymyalgia rheumatica: Secondary | ICD-10-CM

## 2015-04-29 NOTE — Telephone Encounter (Signed)
referral placed

## 2015-05-13 ENCOUNTER — Telehealth: Payer: Self-pay | Admitting: Family Medicine

## 2015-05-13 ENCOUNTER — Ambulatory Visit (INDEPENDENT_AMBULATORY_CARE_PROVIDER_SITE_OTHER): Payer: BLUE CROSS/BLUE SHIELD

## 2015-05-13 ENCOUNTER — Other Ambulatory Visit (INDEPENDENT_AMBULATORY_CARE_PROVIDER_SITE_OTHER): Payer: BLUE CROSS/BLUE SHIELD

## 2015-05-13 DIAGNOSIS — D518 Other vitamin B12 deficiency anemias: Secondary | ICD-10-CM

## 2015-05-13 DIAGNOSIS — E538 Deficiency of other specified B group vitamins: Secondary | ICD-10-CM

## 2015-05-13 MED ORDER — CYANOCOBALAMIN 1000 MCG/ML IJ SOLN
1000.0000 ug | Freq: Once | INTRAMUSCULAR | Status: AC
  Administered 2015-05-13: 1000 ug via INTRAMUSCULAR

## 2015-05-13 NOTE — Telephone Encounter (Signed)
Order placed

## 2015-05-13 NOTE — Telephone Encounter (Signed)
Relation to [id number] Call back number:[phone]   Reason for call:  Patient scheduled her b12 injection today at 3pm with the nurse

## 2015-05-13 NOTE — Telephone Encounter (Signed)
Please advise if ok? Patient has not had a B12 lab completed since 02/2014

## 2015-05-13 NOTE — Telephone Encounter (Signed)
Please have pt get B12 level prior to her injxn

## 2015-05-13 NOTE — Progress Notes (Signed)
Pre visit review using our clinic review tool, if applicable. No additional management support is needed unless otherwise documented below in the visit note.  Vitamin b12 level drawn today (05/13/15). Injection given.  Pt tolerated injection well.   Next dose as directed by PCP.

## 2015-05-14 LAB — VITAMIN B12: VITAMIN B 12: 376 pg/mL (ref 211–911)

## 2015-05-17 ENCOUNTER — Encounter: Payer: Self-pay | Admitting: Family Medicine

## 2015-05-20 ENCOUNTER — Other Ambulatory Visit: Payer: Self-pay | Admitting: Family Medicine

## 2015-05-20 DIAGNOSIS — R5383 Other fatigue: Secondary | ICD-10-CM

## 2015-05-21 ENCOUNTER — Other Ambulatory Visit (INDEPENDENT_AMBULATORY_CARE_PROVIDER_SITE_OTHER): Payer: BLUE CROSS/BLUE SHIELD

## 2015-05-21 DIAGNOSIS — R5383 Other fatigue: Secondary | ICD-10-CM

## 2015-05-21 LAB — TSH: TSH: 2.07 u[IU]/mL (ref 0.35–4.50)

## 2015-05-21 LAB — T4, FREE: FREE T4: 0.82 ng/dL (ref 0.60–1.60)

## 2015-05-21 LAB — T3, FREE: T3 FREE: 3.5 pg/mL (ref 2.3–4.2)

## 2015-05-24 MED ORDER — GABAPENTIN 100 MG PO CAPS: 100.0000 mg | ORAL_CAPSULE | Freq: Three times a day (TID) | ORAL | Status: DC

## 2015-06-30 ENCOUNTER — Ambulatory Visit (INDEPENDENT_AMBULATORY_CARE_PROVIDER_SITE_OTHER): Payer: BLUE CROSS/BLUE SHIELD | Admitting: Behavioral Health

## 2015-06-30 DIAGNOSIS — E538 Deficiency of other specified B group vitamins: Secondary | ICD-10-CM

## 2015-06-30 MED ORDER — CYANOCOBALAMIN 1000 MCG/ML IJ SOLN
1000.0000 ug | Freq: Once | INTRAMUSCULAR | Status: AC
  Administered 2015-06-30: 1000 ug via INTRAMUSCULAR

## 2015-06-30 NOTE — Progress Notes (Signed)
Pre visit review using our clinic review tool, if applicable. No additional management support is needed unless otherwise documented below in the visit note.  Patient in office today for B12 injection. IM given in Left Deltoid. Patient tolerated injection well. She will call at a later date to schedule next appointment.

## 2015-07-14 ENCOUNTER — Telehealth: Payer: Self-pay | Admitting: Family Medicine

## 2015-07-14 NOTE — Telephone Encounter (Signed)
Pt would like to change from Dr. Birdie Riddle to Debbrah Alar, NP - Please advise.

## 2015-07-14 NOTE — Telephone Encounter (Signed)
Ok w/ me 

## 2015-07-14 NOTE — Telephone Encounter (Signed)
Ok with me 

## 2015-07-22 ENCOUNTER — Encounter (HOSPITAL_BASED_OUTPATIENT_CLINIC_OR_DEPARTMENT_OTHER): Payer: Self-pay | Admitting: *Deleted

## 2015-07-22 ENCOUNTER — Emergency Department (HOSPITAL_BASED_OUTPATIENT_CLINIC_OR_DEPARTMENT_OTHER)
Admission: EM | Admit: 2015-07-22 | Discharge: 2015-07-23 | Disposition: A | Discharge status: Home / Self Care | Payer: BLUE CROSS/BLUE SHIELD | Attending: Emergency Medicine | Admitting: Emergency Medicine

## 2015-07-22 DIAGNOSIS — Y999 Unspecified external cause status: Secondary | ICD-10-CM | POA: Diagnosis not present

## 2015-07-22 DIAGNOSIS — S90562A Insect bite (nonvenomous), left ankle, initial encounter: Secondary | ICD-10-CM | POA: Insufficient documentation

## 2015-07-22 DIAGNOSIS — F329 Major depressive disorder, single episode, unspecified: Secondary | ICD-10-CM | POA: Diagnosis not present

## 2015-07-22 DIAGNOSIS — Y929 Unspecified place or not applicable: Secondary | ICD-10-CM | POA: Insufficient documentation

## 2015-07-22 DIAGNOSIS — Z87891 Personal history of nicotine dependence: Secondary | ICD-10-CM | POA: Diagnosis not present

## 2015-07-22 DIAGNOSIS — W57XXXA Bitten or stung by nonvenomous insect and other nonvenomous arthropods, initial encounter: Secondary | ICD-10-CM | POA: Insufficient documentation

## 2015-07-22 DIAGNOSIS — Y939 Activity, unspecified: Secondary | ICD-10-CM | POA: Insufficient documentation

## 2015-07-22 MED ORDER — DIPHENHYDRAMINE HCL 25 MG PO CAPS
50.0000 mg | ORAL_CAPSULE | Freq: Once | ORAL | Status: AC
  Administered 2015-07-22: 50 mg via ORAL
  Filled 2015-07-22: qty 2

## 2015-07-22 MED ORDER — IBUPROFEN 400 MG PO TABS
400.0000 mg | ORAL_TABLET | Freq: Once | ORAL | Status: AC
  Administered 2015-07-22: 400 mg via ORAL
  Filled 2015-07-22: qty 1

## 2015-07-22 NOTE — ED Notes (Signed)
Pt c/o insect bite to left lower leg x 2 days

## 2015-07-22 NOTE — ED Provider Notes (Signed)
CSN: [id number]     Arrival date & time 07/22/15  2207 History   First MD Initiated Contact with Patient 07/22/15 2259     Chief Complaint  Patient presents with  . Insect Bite     (Consider location/radiation/quality/duration/timing/severity/associated sxs/prior Treatment) HPI  Blood pressure 154/91, pulse 85, temperature 98.2 F (36.8 C), resp. rate 18, height 5\' 4"  (1.626 m), weight 81.647 kg, last menstrual period 07/17/2015, SpO2 100 %.  Jaclyn Tucker is a 40 y.o. female complaining of painful swelling to left lower leg onset 2 days ago, thinks she may have been bitten, it was initially pruritic. States that the pain is exacerbated by weightbearing, states that she has pain shooting up the leg when she bears weight. Her last tetanus shot was within the last 5 years. She denies warmth, discharge, fever, chills, redness of the leg.  Past Medical History  Diagnosis Date  . Anxiety   . Migraine   . GERD (gastroesophageal reflux disease)   . Vitamin B12 deficiency   . Depression   . Fibromyalgia 06/2014  . IBS (irritable bowel syndrome) 11/04/2014   Past Surgical History  Procedure Laterality Date  . G2 p2     Family History  Problem Relation Age of Onset  . Diabetes Mother   . Hypertension Father   . Colon cancer Paternal Grandmother   . Breast cancer Paternal Grandmother   . Crohn's disease Brother    Social History  Substance Use Topics  . Smoking status: Former Smoker    Types: Cigarettes    Quit date: 04/13/2014  . Smokeless tobacco: Never Used     Comment: 1 cig/every now and then  . Alcohol Use: 0.0 oz/week    0 Standard drinks or equivalent per week     Comment: 1-2 per month   OB History    No data available     Review of Systems  10 systems reviewed and found to be negative, except as noted in the HPI.   Allergies  Penicillins; Quinolones; Sulfate; Flexeril; Moxifloxacin; and Sumatriptan succinate  Home Medications   Prior to Admission  medications   Medication Sig Start Date End Date Taking? Authorizing Provider  ALPRAZolam Duanne Moron) 0.5 MG tablet Take 0.5 mg by mouth daily as needed.  05/13/14   Historical Provider, MD  aspirin-acetaminophen-caffeine (EXCEDRIN MIGRAINE) [phone] MG per tablet Take 1 tablet by mouth every 6 (six) hours as needed for headache.    Historical Provider, MD  baclofen (LIORESAL) 10 MG tablet Take 0.5 tablets (5 mg total) by mouth 3 (three) times daily as needed for muscle spasms. 04/15/15   Pieter Partridge, DO  benzonatate (TESSALON) 200 MG capsule Take 1 capsule (200 mg total) by mouth 3 (three) times daily as needed for cough. 04/19/15   Midge Minium, MD  busPIRone (BUSPAR) 5 MG tablet Take 1 tablet by mouth 2 (two) times daily. 09/30/14   Historical Provider, MD  gabapentin (NEURONTIN) 100 MG capsule Take 1 capsule (100 mg total) by mouth 3 (three) times daily. 05/24/15   Midge Minium, MD  ipratropium (ATROVENT) 0.06 % nasal spray Place 2 sprays into both nostrils 4 (four) times daily. 03/18/15   Billy Fischer, MD  methocarbamol (ROBAXIN) 750 MG tablet Take 1 tablet (750 mg total) by mouth 3 (three) times daily. 03/05/15   Midge Minium, MD  ondansetron (ZOFRAN) 4 MG tablet Take 1 tablet (4 mg total) by mouth every 8 (eight) hours as needed for nausea or  vomiting. 04/19/15   Midge Minium, MD  promethazine-dextromethorphan (PROMETHAZINE-DM) 6.25-15 MG/5ML syrup Take 5 mLs by mouth 4 (four) times daily as needed. 04/19/15   Midge Minium, MD  sertraline (ZOLOFT) 50 MG tablet Take 1 tablet by mouth daily. 09/12/14   Historical Provider, MD   BP 154/91 mmHg  Pulse 85  Temp(Src) 98.2 F (36.8 C)  Resp 18  Ht 5\' 4"  (1.626 m)  Wt 81.647 kg  BMI 30.88 kg/m2  SpO2 100%  LMP 07/17/2015 Physical Exam  Constitutional: She is oriented to person, place, and time. She appears well-developed and well-nourished. No distress.  HENT:  Head: Normocephalic and atraumatic.  Eyes: Conjunctivae and EOM  are normal. Pupils are equal, round, and reactive to light.  Neck: Normal range of motion.  Cardiovascular: Normal rate and regular rhythm.   Pulmonary/Chest: Effort normal and breath sounds normal. No stridor. No respiratory distress. She has no wheezes. She has no rales. She exhibits no tenderness.  Abdominal: Soft. Bowel sounds are normal. She exhibits no distension and no mass. There is no tenderness. There is no rebound and no guarding.  Musculoskeletal: Normal range of motion. She exhibits edema and tenderness.  Puncture wound to left medial ankle with slight erythema and swelling and tenderness to palpation, no warmth, crepitance, or discharge.   Neurological: She is alert and oriented to person, place, and time.  Psychiatric: She has a normal mood and affect.  Nursing note and vitals reviewed.   ED Course  Procedures (including critical care time) Labs Review Labs Reviewed - No data to display  Imaging Review No results found. I have personally reviewed and evaluated these images and lab results as part of my medical decision-making.   EKG Interpretation None      MDM   Final diagnoses:  Insect bite   Filed Vitals:   07/22/15 2210  BP: 154/91  Pulse: 85  Temp: 98.2 F (36.8 C)  Resp: 18  Height: 5\' 4"  (1.626 m)  Weight: 81.647 kg  SpO2: 100%    Medications  ibuprofen (ADVIL,MOTRIN) tablet 400 mg (not administered)  diphenhydrAMINE (BENADRYL) capsule 50 mg (not administered)    Jaclyn Tucker is 40 y.o. female presenting with Insect bite ankle, no signs of infection. Patient will be given crutches because pain is exacerbated by weightbearing. Encourage rest, ice, compression, elevation and Benadryl and ibuprofen to help with swelling.  Evaluation does not show pathology that would require ongoing emergent intervention or inpatient treatment. Pt is hemodynamically stable and mentating appropriately. Discussed findings and plan with patient/guardian, who agrees  with care plan. All questions answered. Return precautions discussed and outpatient follow up given.       Monico Blitz, PA-C 07/22/15 [place], MD 07/23/15 [phone]

## 2015-07-22 NOTE — Discharge Instructions (Signed)
Rest, Ice intermittently (in the first 24-48 hours), Gentle compression with an Ace wrap, and elevate (Limb above the level of the heart)   Take up to 800mg  of ibuprofen (that is usually 4 over the counter pills)  3 times a day for 5 days. Take with food.  1 to 2 tablets of 25 mg Benadryl pills every 4-6 hours as needed to a maximum of 300 mg per day. In addition, you may apply a topical hydrocortisone ointment to all affected areas except for the face.   Please follow with your primary care doctor in the next 2 days for a check-up. They must obtain records for further management.   Do not hesitate to return to the Emergency Department for any new, worsening or concerning symptoms.

## 2015-07-23 ENCOUNTER — Encounter: Payer: Self-pay | Admitting: Family

## 2015-07-27 ENCOUNTER — Encounter: Payer: Self-pay | Admitting: Family

## 2015-07-27 ENCOUNTER — Ambulatory Visit (INDEPENDENT_AMBULATORY_CARE_PROVIDER_SITE_OTHER): Payer: BLUE CROSS/BLUE SHIELD | Admitting: Family

## 2015-07-27 VITALS — BP 109/70 | HR 88 | Temp 98.5°F | Ht 64.0 in | Wt 179.6 lb

## 2015-07-27 DIAGNOSIS — M797 Fibromyalgia: Secondary | ICD-10-CM

## 2015-07-27 DIAGNOSIS — K589 Irritable bowel syndrome without diarrhea: Secondary | ICD-10-CM | POA: Diagnosis not present

## 2015-07-27 DIAGNOSIS — F411 Generalized anxiety disorder: Secondary | ICD-10-CM

## 2015-07-27 DIAGNOSIS — D518 Other vitamin B12 deficiency anemias: Secondary | ICD-10-CM | POA: Diagnosis not present

## 2015-07-27 DIAGNOSIS — G43009 Migraine without aura, not intractable, without status migrainosus: Secondary | ICD-10-CM

## 2015-07-27 MED ORDER — CYANOCOBALAMIN 1000 MCG/ML IJ SOLN
1000.0000 ug | Freq: Once | INTRAMUSCULAR | Status: AC
  Administered 2015-07-27: 1000 ug via INTRAMUSCULAR

## 2015-07-27 NOTE — Assessment & Plan Note (Signed)
Stable on gabapentin, continue same.  

## 2015-07-27 NOTE — Patient Instructions (Signed)
Please schedule a complete physical at the front desk. Please return for nurse visit in 1 month for b12 injection.

## 2015-07-27 NOTE — Assessment & Plan Note (Signed)
Stable with dietary modification.

## 2015-07-27 NOTE — Assessment & Plan Note (Signed)
Stable on zoloft, managed by psychiatry.

## 2015-07-27 NOTE — Progress Notes (Signed)
Subjective:    Patient ID: Jaclyn Tucker, female    DOB: 19-Aug-1975, 40 y.o.   MRN: [id number]  HPI  Jaclyn Tucker is a 40 yr old female who presents today to establish care. She was previously followed by Dr. Birdie Riddle.  Pmhx is significant for:  b12 deficiency-  On monthly b12 injections.  Anxiety/Depression- maintained on sertraline, buspar,  and prn alprazolam. She sees psychiatry. Dr. Pauline Good.  Reports well controlled currently.    Fibromyalgia- She is on gabapentin which helps some. This helps to dull the pain.    IBS- this flares intermittently.  Tries to improve diet. She alternates between constipation and diarrhea.    Migraines- reports 1-2 migraines every 6 months.  Uses excedrin migraine.    Review of Systems See HPI  Past Medical History  Diagnosis Date  . Anxiety   . Migraine   . GERD (gastroesophageal reflux disease)   . Vitamin B12 deficiency   . Depression   . Fibromyalgia 06/2014  . IBS (irritable bowel syndrome) 11/04/2014     Social History   Social History  . Marital Status: Married    Spouse Name: N/A  . Number of Children: 2  . Years of Education: N/A   Occupational History  . customer service    Social History Main Topics  . Smoking status: Former Smoker    Types: Cigarettes    Quit date: 04/13/2014  . Smokeless tobacco: Never Used     Comment: 1 cig/every now and then  . Alcohol Use: 0.0 oz/week    0 Standard drinks or equivalent per week     Comment: 1-2 per month  . Drug Use: No  . Sexual Activity: Yes    Birth Control/ Protection: None   Other Topics Concern  . 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 G2 p2      Family History  Problem Relation Age of Onset  . Diabetes Mother   . Hypertension Father   . Colon cancer Paternal Grandmother   . Breast  cancer Paternal Grandmother   . Crohn's disease Brother     Allergies  Allergen Reactions  . Penicillins     REACTION: swelling  . Quinolones Anaphylaxis and Palpitations    REACTION: nausea  . Sulfate Rash  . Flexeril [Cyclobenzaprine] Swelling  . Moxifloxacin     REACTION: nausea  . Sumatriptan Succinate     REACTION: nausea, dizziness, panic symptoms    Current Outpatient Prescriptions on File Prior to Visit  Medication Sig Dispense Refill  . ALPRAZolam (XANAX) 0.5 MG tablet Take 0.5 mg by mouth daily as needed.     Marland Kitchen aspirin-acetaminophen-caffeine (EXCEDRIN MIGRAINE) 250-250-65 MG per tablet Take 1 tablet by mouth every 6 (six) hours as needed for headache.    . baclofen (LIORESAL) 10 MG tablet Take 0.5 tablets (5 mg total) by mouth 3 (three) times daily as needed for muscle spasms. 45 each 1  . benzonatate (TESSALON) 200 MG capsule Take 1 capsule (200 mg total) by mouth 3 (three) times daily as needed for cough. 60 capsule 0  . busPIRone (BUSPAR) 5 MG tablet Take 1 tablet by mouth 2 (two) times daily.    Marland Kitchen gabapentin (NEURONTIN) 100 MG capsule Take 1 capsule (100  mg total) by mouth 3 (three) times daily. 90 capsule 3  . ipratropium (ATROVENT) 0.06 % nasal spray Place 2 sprays into both nostrils 4 (four) times daily. 15 mL 1  . methocarbamol (ROBAXIN) 750 MG tablet Take 1 tablet (750 mg total) by mouth 3 (three) times daily. 45 tablet 0  . ondansetron (ZOFRAN) 4 MG tablet Take 1 tablet (4 mg total) by mouth every 8 (eight) hours as needed for nausea or vomiting. 20 tablet 0  . promethazine-dextromethorphan (PROMETHAZINE-DM) 6.25-15 MG/5ML syrup Take 5 mLs by mouth 4 (four) times daily as needed. 240 mL 0  . sertraline (ZOLOFT) 50 MG tablet Take 1 tablet by mouth daily.     No current facility-administered medications on file prior to visit.    BP 109/70 mmHg  Pulse 88  Temp(Src) 98.5 F (36.9 C) (Oral)  Ht 5\' 4"  (1.626 m)  Wt 179 lb 9.6 oz (81.466 kg)  BMI 30.81 kg/m2   SpO2 99%  LMP 07/17/2015       Objective:   Physical Exam  Constitutional: She is oriented to person, place, and time. She appears well-developed and well-nourished.  HENT:  Head: Normocephalic and atraumatic.  Cardiovascular: Normal rate, regular rhythm and normal heart sounds.   No murmur heard. Pulmonary/Chest: Effort normal and breath sounds normal. No respiratory distress. She has no wheezes.  Neurological: She is alert and oriented to person, place, and time.  Psychiatric: She has a normal mood and affect. Her behavior is normal. Judgment and thought content normal.          Assessment & Plan:

## 2015-07-27 NOTE — Assessment & Plan Note (Signed)
Currently stable. Monitor.  

## 2015-07-27 NOTE — Addendum Note (Signed)
Addended by: Emi Holes on: 07/27/2015 10:49 AM   Modules accepted: Orders

## 2015-07-27 NOTE — Assessment & Plan Note (Signed)
Continue monthly injections, Injection today.

## 2015-07-27 NOTE — Progress Notes (Signed)
Pre visit review using our clinic review tool, if applicable. No additional management support is needed unless otherwise documented below in the visit note. 

## 2015-08-10 ENCOUNTER — Encounter: Payer: Self-pay | Admitting: Family

## 2015-08-10 NOTE — Telephone Encounter (Signed)
Melissa--you can find the attachments in chart review under the media tab. He sent three attachments.

## 2015-08-19 ENCOUNTER — Encounter: Payer: Self-pay | Admitting: Family

## 2015-08-20 ENCOUNTER — Telehealth: Payer: Self-pay | Admitting: *Deleted

## 2015-08-20 ENCOUNTER — Encounter: Payer: Self-pay | Admitting: Medical

## 2015-08-20 ENCOUNTER — Ambulatory Visit (INDEPENDENT_AMBULATORY_CARE_PROVIDER_SITE_OTHER): Payer: BLUE CROSS/BLUE SHIELD | Admitting: Medical

## 2015-08-20 VITALS — BP 110/78 | HR 87 | Temp 98.1°F | Ht 64.0 in | Wt 179.2 lb

## 2015-08-20 DIAGNOSIS — R490 Dysphonia: Secondary | ICD-10-CM | POA: Diagnosis not present

## 2015-08-20 DIAGNOSIS — R479 Unspecified speech disturbances: Secondary | ICD-10-CM

## 2015-08-20 NOTE — Telephone Encounter (Signed)
Melissa. Would you look at my note on last visit. I saw pt my chart message to. If I can't get her in with neurologist quickly will you see her early next week.

## 2015-08-20 NOTE — Telephone Encounter (Signed)
Per verbal from PCP, please have pt seen today. Spoke with pt and schedule appt with PA, Saguier at 3:30 today.

## 2015-08-20 NOTE — Telephone Encounter (Signed)
Yes, pt needs OV please as soon as possible.

## 2015-08-20 NOTE — Patient Instructions (Addendum)
For  your various episodes of speech difficultly I do think we need to get you in with your neurologist relatively quickly. I will put your referral in and I will try to get Anderson Malta to work on that and give you a call by Tuesday. If she does not call you then please call here for update.  Your neurologic exam is normal today. And you have had 2 normal ct one in 2015 and another in 2016.   Dr. Tomi Likens may end up ordering MRI.  If you get prolonged episode of speech difficulty or other type neurologic symptoms associated with then recommend ED evaluation.(in that event may need CT followed by MRI)  Follow up early next week by phone or as needed office visit

## 2015-08-20 NOTE — Telephone Encounter (Signed)
Will you please get pt in with Dr. Tomi Likens asap. Pt is very distraught with my recommendation to refer to her neurolgist for her various recurrent speech difficulty episodes. I think the sooner she gets in and evaluated the better. If she gets word that she has no problem.So can you get her in by Tuesday 13th.

## 2015-08-20 NOTE — Progress Notes (Signed)
Subjective:    Patient ID: Jaclyn Tucker, female    DOB: [birth year], 40 y.o.   MRN: [id number]  HPI  Pt in with some report of occasional difficulty speaking at times. She states this occurs intermittently at times. Last time had was last night. Pt states words were slow getting out. She states this has been going on just the last couple of weeks. Pt estimates has occurred maybe 10 times. Pt just noticed maybe one time that she was tongue tied.   Pt blood pressure for most controlled. But sometiems borderline.  Pt gets occasional ha for years. HA can last anywhere from 1-3 days. Pt has seen neurologist in past for HA and told likley migraines. Told not to take ibuprofen to avoid rebound headaches. Pt states often get side effects.   Occasional rare dizziness but position related.  Pt had 2 ct in the past both were negative.   Pt never had mri.  Pt has fibromyalgia. She speculate speech issues may be related..  Pt does not report any eye pain or neuritis type symptoms.   Review of Systems  Constitutional: Negative for fever, chills, diaphoresis, activity change and fatigue.  Respiratory: Negative for cough, chest tightness and shortness of breath.   Cardiovascular: Negative for chest pain, palpitations and leg swelling.  Gastrointestinal: Negative for nausea, vomiting and abdominal pain.  Musculoskeletal: Negative for neck pain and neck stiffness.  Neurological: Negative for dizziness, syncope, facial asymmetry, speech difficulty, weakness, light-headedness, numbness and headaches.  Psychiatric/Behavioral: Negative for behavioral problems, confusion and agitation. The patient is not nervous/anxious.     Past Medical History  Diagnosis Date  . Anxiety   . Migraine   . GERD (gastroesophageal reflux disease)   . Vitamin B12 deficiency   . Depression   . Fibromyalgia 06/2014  . IBS (irritable bowel syndrome) 11/04/2014     Social History   Social History  . Marital Status:  Married    Spouse Name: N/A  . Number of Children: 2  . Years of Education: N/A   Occupational History  . customer service    Social History Main Topics  . Smoking status: Former Smoker    Types: Cigarettes    Quit date: 04/13/2014  . Smokeless tobacco: Never Used     Comment: 1 cig/every now and then  . Alcohol Use: 0.0 oz/week    0 Standard drinks or equivalent per week     Comment: 1-2 per month  . Drug Use: No  . Sexual Activity: Yes    Birth Control/ Protection: None   Other Topics Concern  . 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 G2 p2      Family History  Problem Relation Age of Onset  . Diabetes Mother   . Hypertension Father   . Colon cancer Paternal Grandmother   . Breast cancer Paternal Grandmother   . Crohn's disease Brother     Allergies  Allergen Reactions  . Penicillins     REACTION: swelling  . Quinolones Anaphylaxis and Palpitations    REACTION: nausea  . Sulfate Rash  . Flexeril [Cyclobenzaprine] Swelling  . Moxifloxacin     REACTION: nausea  . Sumatriptan Succinate     REACTION: nausea, dizziness, panic symptoms  Current Outpatient Prescriptions on File Prior to Visit  Medication Sig Dispense Refill  . ALPRAZolam (XANAX) 0.5 MG tablet Take 0.5 mg by mouth daily as needed.     Marland Kitchen aspirin-acetaminophen-caffeine (EXCEDRIN MIGRAINE) 250-250-65 MG per tablet Take 1 tablet by mouth every 6 (six) hours as needed for headache.    . gabapentin (NEURONTIN) 100 MG capsule Take 1 capsule (100 mg total) by mouth 3 (three) times daily. 90 capsule 3  . methocarbamol (ROBAXIN) 750 MG tablet Take 1 tablet (750 mg total) by mouth 3 (three) times daily. 45 tablet 0  . sertraline (ZOLOFT) 50 MG tablet Take 0.5 tablets by mouth daily.     No current facility-administered medications on  file prior to visit.    BP 110/78 mmHg  Pulse 87  Temp(Src) 98.1 F (36.7 C) (Oral)  Ht 5\' 4"  (1.626 m)  Wt 179 lb 3.2 oz (81.285 kg)  BMI 30.74 kg/m2  SpO2 98%  LMP 08/14/2015       Objective:   Physical Exam  General Mental Status- Alert. General Appearance- Not in acute distress.   Skin General: Color- Normal Color. Moisture- Normal Moisture.  Neck Carotid Arteries- Normal color. Moisture- Normal Moisture. No carotid bruits. No JVD.  Chest and Lung Exam Auscultation: Breath Sounds:-Normal.  Cardiovascular Auscultation:Rythm- Regular. Murmurs & Other Heart Sounds:Auscultation of the heart reveals- No Murmurs.   Neurologic Cranial Nerve exam:- CN III-XII intact(No nystagmus), symmetric smile. Drift Test:- No drift. Romberg Exam:- Negative.  Heal to Toe Gait exam:-Normal. Finger to Nose:- Normal/Intact Strength:- 5/5 equal and symmetric strength both upper and lower extremities.      Assessment & Plan:  For your reported various episodes of speech difficultly I do think we need to get you in with your neurologist relatively quickly. I will put your referral in and I will try to get Anderson Malta to work on that and give you a call by Tuesday. If she does not call you then please call here for update.  Your neurologic exam is normal today. And you have had 2 normal ct one in 2015 and another in 2016.   Dr. Tomi Likens may end up ordering MRI.  If you get prolonged episode of speech difficulty or other type neurologic symptoms associated with then recommend ED evaluation.(in that event may need CT followed by MRI)  Follow up early next week by phone or as needed office visit  Pt quite upset about my recommendation. I tried to counsel her that I don't see anything wrong now on exam  but with her complaints we needs to proceed with caution. She seemed to understand and stop crying but then as she was leaving she was still crying some. Note pt called earlier and was  initially told to go to ED. Pcp could not see her and then she was offered appointment with me. I will send Anderson Malta a note and ask if they can see pt asap so she can get any appropiate studies done. Try to get her in early next week.

## 2015-08-20 NOTE — Progress Notes (Signed)
Pre visit review using our clinic review tool, if applicable. No additional management support is needed unless otherwise documented below in the visit note. 

## 2015-08-20 NOTE — Telephone Encounter (Signed)
Called pt to assess regarding MyChart message below. She reports intermittent episodes of slurring certain words x2 weeks. Her speech as a whole is not affected, just certain words. This happens maybe 10x per day, although pt says she has not actually counted. She denies any new or different numbness or weakness of extremities, but does endorse weakness of bilateral legs d/t fibromyalgia and occasional numbness of hands that is an ongoing issue. Denies HA, recent head injury/trauma, new/different back or neck pain, urinary symptoms, facial drooping, confusion, and dizziness. Pt advised to go to ED if alarm symptoms develop, and she agreed. Please advise if you would like pt to schedule OV.   --  "Good Afternoon,  This whole fibromyalgia thing still perplexes me and new things pop up all the time that I can't explain. I usually just write them off as fibromyalgia, but this issue is a little different. I have been having speech issues for the past two weeks, I am slurring some words and it has me a little panicked. I was having what seemed like neuro issues back in January and an EMG was done in Feb. Everything appeared normal in the EMG and that kinda confirmed the fibromyalgia with me. The symptoms still remain, but again.Marland Kitcheni just write them off as fibro. The speech thing is new and is a little concerning to me.Marland KitchenMarland KitchenI guess what I am worried about is if the EMG would pick up any issues in my bulbar muscles (lower motor neuron area) if only my limbs were assessed. I was tested from my ankle all the way up to my upper arm. I get a little anxious when new symptoms come up and Dr. Essie Hart is not my friend. Sorry to be a bother, but just trying figure all this out. Thanks!"

## 2015-08-26 ENCOUNTER — Encounter: Payer: Self-pay | Admitting: Neurology

## 2015-08-26 ENCOUNTER — Ambulatory Visit (INDEPENDENT_AMBULATORY_CARE_PROVIDER_SITE_OTHER): Payer: BLUE CROSS/BLUE SHIELD | Admitting: Neurology

## 2015-08-26 VITALS — HR 92 | Ht 64.0 in | Wt 179.0 lb

## 2015-08-26 DIAGNOSIS — R208 Other disturbances of skin sensation: Secondary | ICD-10-CM

## 2015-08-26 DIAGNOSIS — R413 Other amnesia: Secondary | ICD-10-CM | POA: Diagnosis not present

## 2015-08-26 DIAGNOSIS — R200 Anesthesia of skin: Secondary | ICD-10-CM

## 2015-08-26 DIAGNOSIS — R479 Unspecified speech disturbances: Secondary | ICD-10-CM

## 2015-08-26 DIAGNOSIS — R4189 Other symptoms and signs involving cognitive functions and awareness: Secondary | ICD-10-CM | POA: Diagnosis not present

## 2015-08-26 NOTE — Patient Instructions (Addendum)
1.  We will check MRI of brain with and without contrast - [place] 09/04/15 @ 10:00 [phone]) 2.  If unremarkable, I would like to send you for a neuropsychological evaluation to assess the language and memory issues. 3.  For hand numbness, try wearing wrist splints.  You can get them at any pharmacy over the counter. 4.  Follow up after testing.

## 2015-08-26 NOTE — Progress Notes (Signed)
NEUROLOGY FOLLOW UP OFFICE NOTE  Jaclyn Tucker [id number]  HISTORY OF PRESENT ILLNESS: Jaclyn Tucker is a 40 year old right-handed female whom I previously have seen for headache (presumed migraine), now presents for episodes of speech difficulty.  History obtained by patient and PCP note.  UPDATE: Previously, she was concerned about ALS.  Last visit, lab work was checked to further evaluate headache, positive ANA and myalgia.  Labs that I ordered included normal thyroid function tests, B12 376, CRP 0.1, sed rate 15, CK 68, RF negative, Mg 2.1.  Lyme antibody Western Blot reflex was positive with value of 1.33, but she only had one positive IgM band.  NCV-EMG in February 2014 was normal.  She did follow up with rheumatology and workup (such as for lupus) was normal.  Her symptoms were thought to be related to her fibromyalgia. She is still being treated for B12 deficiency with injections.  In March, B12 was 376.  Thyroid panel was normal.  She continues to have muscle twitches and spasms.  However, she also endorses new symptoms as well: 1.  Memory:  Since February 2017, she has had short-term memory problems.  She will walk into a room several times and forget why.  If she is side-tracked while doing something, she will forget what she was doing. 2.  Speech:  For the past 2 weeks, she reports difficulty getting words out.  At times, she will substitute a completely unrelated word (for example, she may say "wolf" instead of "book").   3.  Since before her previous NCV-EMG in February 2017, she has had shooting tingling pain down the arms from her elbow.  However, since March 2017, she also experiences numbness in the hands, usually if she is sitting with arms on the arm-rest.  Her hands feel stiff and hard to move.   4.  Tremor:  She notes increased tremors 5.  Of note, she has had 2 episodes of iritis in the left eye over the past 6 months.  HISTORY: In regards to presumed migraine:  March-April 2016, she developed neck pain into the shoulders, as well as headache from the back of her head, radiating to either side of her head.  It was a pounding and stabbing pain.  It was associated with osmophobia, photophobia and phonophobia.  Occasionally she noted nausea but no visual disturbance.  It was constant and fluctuated in intensity, severe about every 6 days.  She treated it with ibuprofen and Excedrin Migraine.  Headaches subsequently resolved.  She developed the headache again just prior to Christmas 2017, and it was constant for about a month, when it spontaneously resolved.  Since then, she reports that her limbs feel heavy but no actual weakness.  She reports myalgias.  She reports muscle twitches involving the leg, thigh, calf and hands.  She denies double vision, dysphagia or difficulty breathing.  She was found to have a positive ANA.    She has been treated for B12 deficiency.    She reports prior history of ice-pick headaches.  She denies family history of neurologic disease.  PAST MEDICAL HISTORY: Past Medical History  Diagnosis Date  . Anxiety   . Migraine   . GERD (gastroesophageal reflux disease)   . Vitamin B12 deficiency   . Depression   . Fibromyalgia 06/2014  . IBS (irritable bowel syndrome) 11/04/2014    MEDICATIONS: Current Outpatient Prescriptions on File Prior to Visit  Medication Sig Dispense Refill  . ALPRAZolam (XANAX) 0.5 MG tablet  Take 0.5 mg by mouth daily as needed.     Marland Kitchen aspirin-acetaminophen-caffeine (EXCEDRIN MIGRAINE) 250-250-65 MG per tablet Take 1 tablet by mouth every 6 (six) hours as needed for headache.    . gabapentin (NEURONTIN) 100 MG capsule Take 1 capsule (100 mg total) by mouth 3 (three) times daily. 90 capsule 3  . methocarbamol (ROBAXIN) 750 MG tablet Take 1 tablet (750 mg total) by mouth 3 (three) times daily. 45 tablet 0  . sertraline (ZOLOFT) 50 MG tablet Take 0.5 tablets by mouth daily.     No current facility-administered  medications on file prior to visit.    ALLERGIES: Allergies  Allergen Reactions  . Penicillins     REACTION: swelling  . Quinolones Anaphylaxis and Palpitations    REACTION: nausea  . Sulfate Rash  . Flexeril [Cyclobenzaprine] Swelling  . Moxifloxacin     REACTION: nausea  . Sumatriptan Succinate     REACTION: nausea, dizziness, panic symptoms    FAMILY HISTORY: Family History  Problem Relation Age of Onset  . Diabetes Mother   . Hypertension Father   . Colon cancer Paternal Grandmother   . Breast cancer Paternal Grandmother   . Crohn's disease Brother     SOCIAL HISTORY: Social History   Social History  . Marital Status: Married    Spouse Name: N/A  . Number of Children: 2  . Years of Education: N/A   Occupational History  . customer service    Social History Main Topics  . Smoking status: Former Smoker    Types: Cigarettes    Quit date: 04/13/2014  . Smokeless tobacco: Never Used     Comment: 1 cig/every now and then  . Alcohol Use: 0.0 oz/week    0 Standard drinks or equivalent per week     Comment: 1-2 per month  . Drug Use: No  . Sexual Activity: Yes    Birth Control/ Protection: None   Other Topics Concern  . Not on file   Social History Narrative   Marries   2 sons 1999 and 2008   Customer relations Physiological scientist since 2006   No caffeine   11/04/2014       REVIEW OF SYSTEMS: Constitutional: No fevers, chills, or sweats, no generalized fatigue, change in appetite Eyes: No visual changes, double vision, eye pain Ear, nose and throat: No hearing loss, ear pain, nasal congestion, sore throat Cardiovascular: No chest pain, palpitations Respiratory:  No shortness of breath at rest or with exertion, wheezes GastrointestinaI: No nausea, vomiting, diarrhea, abdominal pain, fecal incontinence Genitourinary:  No dysuria, urinary retention or frequency Musculoskeletal:  No neck pain, back pain Integumentary: No rash, pruritus, skin  lesions Neurological: as above Psychiatric: No depression, insomnia, anxiety Endocrine: No palpitations, fatigue, diaphoresis, mood swings, change in appetite, change in weight, increased thirst Hematologic/Lymphatic:  No purpura, petechiae. Allergic/Immunologic: no itchy/runny eyes, nasal congestion, recent allergic reactions, rashes  PHYSICAL EXAM: Filed Vitals:   08/26/15 1014  Pulse: 92   General: No acute distress.  Patient appears well-groomed.  normal body habitus. Head:  Normocephalic/atraumatic Eyes:  Fundi examined but not visualized Neck: supple, no paraspinal tenderness, full range of motion Heart:  Regular rate and rhythm Lungs:  Clear to auscultation bilaterally Back: No paraspinal tenderness Neurological Exam: alert and oriented to person, place, and time. Attention span and concentration intact, recent and remote memory intact, fund of knowledge intact.  Speech fluent and not dysarthric, language intact.  She was unable to spell WORLD backward  or perform serial 7 subtraction. MMSE - Mini Mental State Exam 08/26/2015  Orientation to time 5  Orientation to Place 5  Registration 3  Attention/ Calculation 0  Recall 3  Language- name 2 objects 2  Language- repeat 1  Language- follow 3 step command 3  Language- read & follow direction 1  Write a sentence 1  Copy design 1  Total score 25   CN II-XII intact. Bulk and tone normal, muscle strength 5/5 throughout.  Sensation to light touch, temperature and vibration intact.  Deep tendon reflexes 2+ throughout, toes downgoing.  Finger to nose and heel to shin testing intact.  Gait normal, Romberg negative.  IMPRESSION: Multiple symptomatology, including: 1.  Short-term memory loss 2.  Speech disturbance 3.  Numbness in both hands  PLAN: 1.  Will get MRI of brain with and without contrast 2.  If MRI unremarkable, will refer for neuropsychological testing 3.  Numbness may be related to carpal tunnel, but could be related  to fibromyalgia as well.  Will have her try wrist splints for now.. If symptoms get worse, we can repeat NCV-EMG, since symptoms started after the previous NCV-EMG. 4.  Follow up after testing  30 minutes spent face to face with patient, over 50% spent discussing diagnoses and counseling.   Metta Clines, DO  CC:  Debbrah Alar, NP

## 2015-08-28 ENCOUNTER — Ambulatory Visit (HOSPITAL_COMMUNITY)
Admission: RE | Admit: 2015-08-28 | Discharge: 2015-08-28 | Disposition: A | Discharge status: Home / Self Care | Payer: BLUE CROSS/BLUE SHIELD | Source: Ambulatory Visit | Attending: Neurology | Admitting: Neurology

## 2015-08-28 DIAGNOSIS — R208 Other disturbances of skin sensation: Secondary | ICD-10-CM | POA: Insufficient documentation

## 2015-08-28 DIAGNOSIS — R413 Other amnesia: Secondary | ICD-10-CM

## 2015-08-28 DIAGNOSIS — R479 Unspecified speech disturbances: Secondary | ICD-10-CM

## 2015-08-28 DIAGNOSIS — R4189 Other symptoms and signs involving cognitive functions and awareness: Secondary | ICD-10-CM | POA: Diagnosis not present

## 2015-08-28 DIAGNOSIS — R200 Anesthesia of skin: Secondary | ICD-10-CM

## 2015-08-28 MED ORDER — GADOBENATE DIMEGLUMINE 529 MG/ML IV SOLN
16.0000 mL | Freq: Once | INTRAVENOUS | Status: AC | PRN
  Administered 2015-08-28: 16 mL via INTRAVENOUS

## 2015-08-30 ENCOUNTER — Telehealth: Payer: Self-pay

## 2015-08-30 DIAGNOSIS — R413 Other amnesia: Secondary | ICD-10-CM

## 2015-08-30 NOTE — Telephone Encounter (Signed)
Left message on machine for pt to return call to the office.  

## 2015-08-30 NOTE — Telephone Encounter (Signed)
Order placed. Given to scheduling staff. Will call later.

## 2015-08-30 NOTE — Telephone Encounter (Signed)
-----   Message from Pieter Partridge, DO sent at 08/30/2015  8:03 AM EDT ----- MRI is normal.  Please schedule neuropsych testing

## 2015-08-31 NOTE — Telephone Encounter (Signed)
Would like to hold off for neuropsychological testing for now. Says she thinks it was probably stress, and work is going to only get worse for the next 3 months. Will notify front dest not to schedule. Pt advised to call if she decided she would like testing.

## 2015-09-03 ENCOUNTER — Ambulatory Visit (INDEPENDENT_AMBULATORY_CARE_PROVIDER_SITE_OTHER): Payer: BLUE CROSS/BLUE SHIELD | Admitting: *Deleted

## 2015-09-03 DIAGNOSIS — E538 Deficiency of other specified B group vitamins: Secondary | ICD-10-CM | POA: Diagnosis not present

## 2015-09-03 MED ORDER — CYANOCOBALAMIN 1000 MCG/ML IJ SOLN
1000.0000 ug | Freq: Once | INTRAMUSCULAR | Status: AC
  Administered 2015-09-03: 1000 ug via INTRAMUSCULAR

## 2015-09-03 NOTE — Progress Notes (Signed)
Pre visit review using our clinic review tool, if applicable. No additional management support is needed unless otherwise documented below in the visit note.  Patient tolerated injection well.  Next appointment: Pt will call to schedule.  Dorrene German, RN

## 2015-09-04 ENCOUNTER — Ambulatory Visit (HOSPITAL_BASED_OUTPATIENT_CLINIC_OR_DEPARTMENT_OTHER): Payer: BLUE CROSS/BLUE SHIELD

## 2015-09-23 ENCOUNTER — Other Ambulatory Visit: Payer: Self-pay | Admitting: *Deleted

## 2015-09-23 ENCOUNTER — Encounter: Payer: Self-pay | Admitting: Neurology

## 2015-09-23 DIAGNOSIS — R200 Anesthesia of skin: Secondary | ICD-10-CM

## 2015-09-23 NOTE — Telephone Encounter (Signed)
Please see my chart message from pt.

## 2015-09-23 NOTE — Telephone Encounter (Signed)
Order placed. Message sent to front desk staff to schedule.

## 2015-09-28 ENCOUNTER — Ambulatory Visit (INDEPENDENT_AMBULATORY_CARE_PROVIDER_SITE_OTHER): Payer: BLUE CROSS/BLUE SHIELD | Admitting: Neurology

## 2015-09-28 DIAGNOSIS — R200 Anesthesia of skin: Secondary | ICD-10-CM

## 2015-09-28 DIAGNOSIS — R208 Other disturbances of skin sensation: Secondary | ICD-10-CM | POA: Diagnosis not present

## 2015-09-28 NOTE — Procedures (Signed)
[facility]  [address], [place]  [place],  [address] Tel: [phone] Fax:  [phone] Test Date:  09/28/2015  Patient: Jaclyn Tucker DOB: 30-May-1975 Physician: Narda Amber, DO  Sex: Female Height: 5\' 4"  Ref Phys: Metta Clines, M.D.  ID#: [id number] Temp: 33.6C Technician: Jerilynn Mages. Dean   Patient Complaints: This is a 40 year old female referred for evaluation of bilateral hand paresthesias and muscle twitches.  NCV & EMG Findings: Extensive electrodiagnostic testing of the right upper extremity and additional studies of the right shows: 1. All sensory responses including bilateral median, ulnar, and mixed palmar studies are within normal limits. 2. All motor responses including bilateral median and ulnar motor responses are within normal limits. 3. There is no evidence of active or chronic motor axon loss changes affecting any of the tested muscles. Motor unit configuration and recruitment pattern is within normal limits.  Impression: This is a normal study of the upper extremities. In particular, there is no evidence of a cervical radiculopathy or carpal tunnel syndrome.   ___________________________ Narda Amber, DO    Nerve Conduction Studies Anti Sensory Summary Table   Stim Site NR Peak (ms) Norm Peak (ms) P-T Amp (V) Norm P-T Amp  Left Median Anti Sensory (2nd Digit)  Wrist    2.8 <3.4 33.2 >20  Right Median Anti Sensory (2nd Digit)  Wrist    2.9 <3.4 33.4 >20  Left Ulnar Anti Sensory (5th Digit)  Wrist    2.4 <3.1 46.3 >12  Right Ulnar Anti Sensory (5th Digit)  Wrist    2.8 <3.1 45.7 >12   Motor Summary Table   Stim Site NR Onset (ms) Norm Onset (ms) O-P Amp (mV) Norm O-P Amp Site1 Site2 Delta-0 (ms) Dist (cm) Vel (m/s) Norm Vel (m/s)  Left Median Motor (Abd Poll Brev)  Wrist    2.9 <3.9 9.7 >6 Elbow Wrist 4.0 23.0 58 >50  Elbow    6.9  9.5         Right Median Motor (Abd Poll Brev)  Wrist    3.0 <3.9 10.7 >6 Elbow Wrist 3.7 21.0 57 >50   Elbow    6.7  10.0         Left Ulnar Motor (Abd Dig Minimi)  Wrist    2.4 <3.1 9.9 >7 B Elbow Wrist 3.1 18.0 58 >50  B Elbow    5.5  9.3  A Elbow B Elbow 1.5 10.0 67 >50  A Elbow    7.0  8.4         Right Ulnar Motor (Abd Dig Minimi)  Wrist    2.5 <3.1 9.8 >7 B Elbow Wrist 3.1 18.0 58 >50  B Elbow    5.6  9.5  A Elbow B Elbow 1.3 10.0 77 >50  A Elbow    6.9  9.1          Comparison Summary Table   Stim Site NR Peak (ms) Norm Peak (ms) P-T Amp (V) Site1 Site2 Delta-P (ms) Norm Delta (ms)  Left Median/Ulnar Palm Comparison (Wrist - 8cm)  Median Palm    1.8 <2.2 98.1 Median Palm Ulnar Palm 0.1   Ulnar Palm    1.9 <2.2 17.8      Right Median/Ulnar Palm Comparison (Wrist - 8cm)  Median Palm    1.7 <2.2 79.8 Median Palm Ulnar Palm 0.1   Ulnar Palm    1.8 <2.2 28.7       EMG   Side Muscle Ins Act  Fibs Psw Fasc Number Recrt Dur Dur. Amp Amp. Poly Poly. Comment  Right 1stDorInt Nml Nml Nml Nml Nml Nml Nml Nml Nml Nml Nml Nml N/A  Right Ext Indicis Nml Nml Nml Nml Nml Nml Nml Nml Nml Nml Nml Nml N/A  Right PronatorTeres Nml Nml Nml Nml Nml Nml Nml Nml Nml Nml Nml Nml N/A  Right Biceps Nml Nml Nml Nml Nml Nml Nml Nml Nml Nml Nml Nml N/A  Right Triceps Nml Nml Nml Nml Nml Nml Nml Nml Nml Nml Nml Nml N/A  Right Deltoid Nml Nml Nml Nml Nml Nml Nml Nml Nml Nml Nml Nml N/A  Left 1stDorInt Nml Nml Nml Nml Nml Nml Nml Nml Nml Nml Nml Nml N/A  Left Ext Indicis Nml Nml Nml Nml Nml Nml Nml Nml Nml Nml Nml Nml N/A  Left PronatorTeres Nml Nml Nml Nml Nml Nml Nml Nml Nml Nml Nml Nml N/A  Left Biceps Nml Nml Nml Nml Nml Nml Nml Nml Nml Nml Nml Nml N/A  Left Triceps Nml Nml Nml Nml Nml Nml Nml Nml Nml Nml Nml Nml N/A  Left Deltoid Nml Nml Nml Nml Nml Nml Nml Nml Nml Nml Nml Nml N/A      Waveforms:

## 2015-09-29 ENCOUNTER — Telehealth: Payer: Self-pay

## 2015-09-29 NOTE — Telephone Encounter (Signed)
-----   Message from Pieter Partridge, DO sent at 09/29/2015  7:30 AM EDT ----- Nerve study is normal.  There is no evidence of nerve damage.  It may be symptoms of fibromyalgia.  I would monitor symptoms for now.

## 2015-09-29 NOTE — Telephone Encounter (Signed)
Results released via mychart. 

## 2015-10-05 ENCOUNTER — Encounter: Payer: BLUE CROSS/BLUE SHIELD | Admitting: Neurology

## 2015-10-15 ENCOUNTER — Ambulatory Visit (INDEPENDENT_AMBULATORY_CARE_PROVIDER_SITE_OTHER): Payer: BLUE CROSS/BLUE SHIELD | Admitting: Behavioral Health

## 2015-10-15 DIAGNOSIS — E538 Deficiency of other specified B group vitamins: Secondary | ICD-10-CM | POA: Diagnosis not present

## 2015-10-15 MED ORDER — CYANOCOBALAMIN 1000 MCG/ML IJ SOLN
1000.0000 ug | Freq: Once | INTRAMUSCULAR | Status: AC
  Administered 2015-10-15: 1000 ug via INTRAMUSCULAR

## 2015-10-15 NOTE — Progress Notes (Signed)
Pre visit review using our clinic review tool, if applicable. No additional management support is needed unless otherwise documented below in the visit note.  Patient in office today for B12 injection. IM given in Left Deltoid. Patient tolerated injection well.  She will call the office at a later date to schedule the next appointment.

## 2015-11-26 ENCOUNTER — Ambulatory Visit (INDEPENDENT_AMBULATORY_CARE_PROVIDER_SITE_OTHER): Payer: BLUE CROSS/BLUE SHIELD | Admitting: Behavioral Health

## 2015-11-26 DIAGNOSIS — E538 Deficiency of other specified B group vitamins: Secondary | ICD-10-CM | POA: Diagnosis not present

## 2015-11-26 MED ORDER — CYANOCOBALAMIN 1000 MCG/ML IJ SOLN
1000.0000 ug | Freq: Once | INTRAMUSCULAR | Status: AC
  Administered 2015-11-26: 1000 ug via INTRAMUSCULAR

## 2015-11-26 NOTE — Progress Notes (Signed)
Pre visit review using our clinic review tool, if applicable. No additional management support is needed unless otherwise documented below in the visit note.  Patient in office today for B12 injection. IM given in Left Deltoid. Patient tolerated injection well.   She will call the office at a later time to schedule next appointment.

## 2015-11-30 ENCOUNTER — Encounter: Payer: Self-pay | Admitting: Family

## 2015-12-01 ENCOUNTER — Encounter: Payer: Self-pay | Admitting: Family

## 2015-12-01 ENCOUNTER — Ambulatory Visit (INDEPENDENT_AMBULATORY_CARE_PROVIDER_SITE_OTHER): Payer: BLUE CROSS/BLUE SHIELD | Admitting: Family

## 2015-12-01 VITALS — BP 126/76 | HR 85 | Temp 98.5°F | Resp 18 | Ht 64.0 in | Wt 178.0 lb

## 2015-12-01 DIAGNOSIS — J029 Acute pharyngitis, unspecified: Secondary | ICD-10-CM | POA: Diagnosis not present

## 2015-12-01 LAB — POCT RAPID STREP A (OFFICE): RAPID STREP A SCREEN: NEGATIVE

## 2015-12-01 MED ORDER — FLUTICASONE PROPIONATE 50 MCG/ACT NA SUSP: 2.0000 | Freq: Every day | NASAL | 6 refills | Status: DC

## 2015-12-01 MED ORDER — LORATADINE 10 MG PO TABS: 10.0000 mg | ORAL_TABLET | Freq: Every day | ORAL | 11 refills | Status: DC

## 2015-12-01 MED ORDER — DOXYCYCLINE HYCLATE 100 MG PO TABS: 100.0000 mg | ORAL_TABLET | Freq: Two times a day (BID) | ORAL | 0 refills | Status: DC

## 2015-12-01 NOTE — Progress Notes (Signed)
Subjective:    Patient ID: Jaclyn Tucker, female    DOB: May 05, 1975, 40 y.o.   MRN: [id number]  HPI  Jaclyn Tucker is a 40 yr old female who presents today with chief complaint of sinus congestion.  She reports + sore throat and redness.  She continues to have nasal congestion (bloody nasal drainage),ear fullness. Less run down. Denies fever.  She reports mild cough due to drainage.  Cough is non-productive. Has maxillary sinus pain and "tooth pain."  She tried alka selzer cold/sinus.   Review of Systems    see HPI  Past Medical History:  Diagnosis Date  . Anxiety   . Depression   . Fibromyalgia 06/2014  . GERD (gastroesophageal reflux disease)   . IBS (irritable bowel syndrome) 11/04/2014  . Migraine   . Vitamin B12 deficiency      Social History   Social History  . Marital status: Married    Spouse name: N/A  . Number of children: 2  . Years of education: N/A   Occupational History  . customer service    Social History Main Topics  . Smoking status: Former Smoker    Types: Cigarettes    Quit date: 04/13/2014  . Smokeless tobacco: Never Used     Comment: 1 cig/every now and then  . Alcohol use 0.0 oz/week     Comment: 1-2 per month  . Drug use: No  . Sexual activity: Yes    Birth control/ protection: None   Other Topics Concern  . Not on file   Social History Narrative   Marries   2 sons 1999 and 2008   Customer relations Physiological scientist since 2006   No caffeine   11/04/2014       Past Surgical History:  Procedure Laterality Date  . g2 p2      Family History  Problem Relation Age of Onset  . Diabetes Mother   . Hypertension Father   . Colon cancer Paternal Grandmother   . Breast cancer Paternal Grandmother   . Crohn's disease Brother     Allergies  Allergen Reactions  . Penicillins     REACTION: swelling  . Quinolones Anaphylaxis and Palpitations    REACTION: nausea  . Sulfate Rash  . Flexeril [Cyclobenzaprine] Swelling  . Moxifloxacin       REACTION: nausea  . Sumatriptan Succinate     REACTION: nausea, dizziness, panic symptoms    Current Outpatient Prescriptions on File Prior to Visit  Medication Sig Dispense Refill  . ALPRAZolam (XANAX) 0.5 MG tablet Take 0.5 mg by mouth daily as needed.     Marland Kitchen aspirin-acetaminophen-caffeine (EXCEDRIN MIGRAINE) 250-250-65 MG per tablet Take 1 tablet by mouth every 6 (six) hours as needed for headache.    . gabapentin (NEURONTIN) 100 MG capsule Take 1 capsule (100 mg total) by mouth 3 (three) times daily. 90 capsule 3  . methocarbamol (ROBAXIN) 750 MG tablet Take 1 tablet (750 mg total) by mouth 3 (three) times daily. (Patient taking differently: Take 750 mg by mouth 3 (three) times daily as needed. ) 45 tablet 0  . sertraline (ZOLOFT) 50 MG tablet Take 0.5 tablets by mouth daily.     No current facility-administered medications on file prior to visit.     BP 126/76 (BP Location: Right Arm, Cuff Size: Large)   Pulse 85   Temp 98.5 F (36.9 C) (Oral)   Resp 18   Ht 5\' 4"  (1.626 m)   Wt 178 lb (80.7  kg)   LMP 11/13/2015   SpO2 99% Comment: room air  BMI 30.55 kg/m    Objective:   Physical Exam  Constitutional: She appears well-developed and well-nourished.  HENT:  Right Ear: Tympanic membrane and ear canal normal.  Left Ear: Tympanic membrane and ear canal normal.  Nose: Right sinus exhibits maxillary sinus tenderness. Right sinus exhibits no frontal sinus tenderness. Left sinus exhibits maxillary sinus tenderness. Left sinus exhibits no frontal sinus tenderness.  Mouth/Throat: No oropharyngeal exudate, posterior oropharyngeal edema or posterior oropharyngeal erythema.  Cardiovascular: Normal rate, regular rhythm and normal heart sounds.   No murmur heard. Pulmonary/Chest: Effort normal and breath sounds normal. No respiratory distress. She has no wheezes.  Psychiatric: She has a normal mood and affect. Her behavior is normal. Judgment and thought content normal.           Assessment & Plan:  Sinusitis- Advised pt as follows:    Please begin doxycycline for sinusitis.  Add claritin 10mg  once daily and flonase 2 sprays each nostril once daily to help with allergies/nasal congestion. Call if new/worsening symptoms or if symptoms are not improved in 1 week.

## 2015-12-01 NOTE — Progress Notes (Signed)
Pre visit review using our clinic review tool, if applicable. No additional management support is needed unless otherwise documented below in the visit note. 

## 2015-12-01 NOTE — Patient Instructions (Signed)
Please begin doxycycline for sinusitis.  Add claritin 10mg  once daily and flonase 2 sprays each nostril once daily. Call if new/worsening symptoms or if symptoms are not improved in 1 week.

## 2015-12-03 LAB — CULTURE, GROUP A STREP: ORGANISM ID, BACTERIA: NORMAL

## 2015-12-31 ENCOUNTER — Ambulatory Visit (INDEPENDENT_AMBULATORY_CARE_PROVIDER_SITE_OTHER): Payer: BLUE CROSS/BLUE SHIELD | Admitting: Behavioral Health

## 2015-12-31 DIAGNOSIS — E538 Deficiency of other specified B group vitamins: Secondary | ICD-10-CM | POA: Diagnosis not present

## 2015-12-31 MED ORDER — CYANOCOBALAMIN 1000 MCG/ML IJ SOLN
1000.0000 ug | Freq: Once | INTRAMUSCULAR | Status: AC
  Administered 2015-12-31: 1000 ug via INTRAMUSCULAR

## 2015-12-31 NOTE — Progress Notes (Signed)
Pre visit review using our clinic review tool, if applicable. No additional management support is needed unless otherwise documented below in the visit note.  Patient in clinic today for B12 injection. IM given in Left Deltoid. Patient tolerated injection well.

## 2016-01-12 DIAGNOSIS — J302 Other seasonal allergic rhinitis: Secondary | ICD-10-CM | POA: Diagnosis not present

## 2016-01-19 ENCOUNTER — Encounter: Payer: Self-pay | Admitting: Family

## 2016-01-24 ENCOUNTER — Telehealth: Payer: Self-pay | Admitting: Family

## 2016-01-24 NOTE — Telephone Encounter (Signed)
Patient states that she has had correspondence with Melissa about her sore throat and Melissa stated that she would like her to come in and be seen. Melissa's next available acute appointment is for 01/31/16. Patient is concerned and would like to come in sooner. Please advise.   Patient phone: [phone]

## 2016-01-24 NOTE — Telephone Encounter (Signed)
We now have an opening tomorrow (11/14) at 11am. Notified pt and scheduled appt.

## 2016-01-25 ENCOUNTER — Encounter: Payer: Self-pay | Admitting: Family

## 2016-01-25 ENCOUNTER — Ambulatory Visit (INDEPENDENT_AMBULATORY_CARE_PROVIDER_SITE_OTHER): Payer: BLUE CROSS/BLUE SHIELD | Admitting: Family

## 2016-01-25 VITALS — BP 133/76 | HR 75 | Temp 98.3°F | Resp 16 | Ht 64.0 in | Wt 182.8 lb

## 2016-01-25 DIAGNOSIS — J029 Acute pharyngitis, unspecified: Secondary | ICD-10-CM

## 2016-01-25 DIAGNOSIS — K219 Gastro-esophageal reflux disease without esophagitis: Secondary | ICD-10-CM | POA: Diagnosis not present

## 2016-01-25 LAB — POCT RAPID STREP A (OFFICE): RAPID STREP A SCREEN: NEGATIVE

## 2016-01-25 MED ORDER — PANTOPRAZOLE SODIUM 40 MG PO TBEC: 40.0000 mg | DELAYED_RELEASE_TABLET | Freq: Every day | ORAL | 3 refills | Status: DC

## 2016-01-25 MED ORDER — GABAPENTIN 100 MG PO CAPS: 100.0000 mg | ORAL_CAPSULE | Freq: Three times a day (TID) | ORAL | 5 refills | Status: DC

## 2016-01-25 NOTE — Progress Notes (Signed)
Pre visit review using our clinic review tool, if applicable. No additional management support is needed unless otherwise documented below in the visit note. 

## 2016-01-25 NOTE — Patient Instructions (Addendum)
Please add protonix once daily. Continue flonase. Call if new/worsening symptoms.  Follow up as scheduled.

## 2016-01-25 NOTE — Progress Notes (Signed)
Subjective:    Patient ID: Jaclyn Tucker, female    DOB: 1975/03/29, 40 y.o.   MRN: [id number]  HPI  Jaclyn Tucker is a 40 yr old female who presents today with chief complaint of sore throat. Reports sore throat has been off/on since September.  She  started otc nexium 2 days ago.  Notes +myalgia on nexium. Thinks sore throat might be a little better since she started nexium.  Reports that she has tolerated protonix in the past.  + post nasal drip, stopped claritin because it was not helping. + cough which is worse at night.   Saw urgent care- they recommended allergy medication.   Review of Systems See HPI  Past Medical History:  Diagnosis Date  . Anxiety   . Depression   . Fibromyalgia 06/2014  . GERD (gastroesophageal reflux disease)   . IBS (irritable bowel syndrome) 11/04/2014  . Migraine   . Vitamin B12 deficiency      Social History   Social History  . Marital status: Married    Spouse name: N/A  . Number of children: 2  . Years of education: N/A   Occupational History  . customer service    Social History Main Topics  . Smoking status: Former Smoker    Types: Cigarettes    Quit date: 04/13/2014  . Smokeless tobacco: Never Used     Comment: 1 cig/every now and then  . Alcohol use 0.0 oz/week     Comment: 1-2 per month  . Drug use: No  . Sexual activity: Yes    Birth control/ protection: None   Other Topics Concern  . Not on file   Social History Narrative   Marries   2 sons 1999 and 2008   Customer relations Physiological scientist since 2006   No caffeine   11/04/2014       Past Surgical History:  Procedure Laterality Date  . g2 p2      Family History  Problem Relation Age of Onset  . Diabetes Mother   . Hypertension Father   . Colon cancer Paternal Grandmother   . Breast cancer Paternal Grandmother   . Crohn's disease Brother     Allergies  Allergen Reactions  . Penicillins     REACTION: swelling  . Quinolones Anaphylaxis and Palpitations      REACTION: nausea  . Sulfate Rash  . Flexeril [Cyclobenzaprine] Swelling  . Moxifloxacin     REACTION: nausea  . Sumatriptan Succinate     REACTION: nausea, dizziness, panic symptoms    Current Outpatient Prescriptions on File Prior to Visit  Medication Sig Dispense Refill  . ALPRAZolam (XANAX) 0.5 MG tablet Take 0.5 mg by mouth daily as needed.     Marland Kitchen aspirin-acetaminophen-caffeine (EXCEDRIN MIGRAINE) 250-250-65 MG per tablet Take 1 tablet by mouth every 6 (six) hours as needed for headache.    . doxycycline (VIBRA-TABS) 100 MG tablet Take 1 tablet (100 mg total) by mouth 2 (two) times daily. 20 tablet 0  . fluticasone (FLONASE) 50 MCG/ACT nasal spray Place 2 sprays into both nostrils daily. 16 g 6  . gabapentin (NEURONTIN) 100 MG capsule Take 1 capsule (100 mg total) by mouth 3 (three) times daily. 90 capsule 3  . loratadine (CLARITIN) 10 MG tablet Take 1 tablet (10 mg total) by mouth daily. 30 tablet 11  . methocarbamol (ROBAXIN) 750 MG tablet Take 1 tablet (750 mg total) by mouth 3 (three) times daily. (Patient taking differently: Take 750 mg by  mouth 3 (three) times daily as needed. ) 45 tablet 0  . sertraline (ZOLOFT) 50 MG tablet Take 0.5 tablets by mouth daily.     No current facility-administered medications on file prior to visit.     BP 133/76 (BP Location: Right Arm, Patient Position: Sitting, Cuff Size: Normal)   Pulse 75   Temp 98.3 F (36.8 C) (Oral)   Resp 16   Ht 5\' 4"  (1.626 m)   Wt 182 lb 12.8 oz (82.9 kg)   LMP 01/09/2016 (Approximate)   SpO2 100% Comment: RA  BMI 31.38 kg/m       Objective:   Physical Exam  Constitutional: She is oriented to person, place, and time. She appears well-developed and well-nourished.  HENT:  Head: Normocephalic.  Right Ear: Tympanic membrane and ear canal normal.  Left Ear: Tympanic membrane and ear canal normal.  Cardiovascular: Normal rate, regular rhythm and normal heart sounds.   No murmur heard. Pulmonary/Chest:  Effort normal and breath sounds normal. No respiratory distress. She has no wheezes.  Neurological: She is alert and oriented to person, place, and time.  Skin: Skin is warm and dry.  Psychiatric: She has a normal mood and affect. Her behavior is normal. Judgment and thought content normal.          Assessment & Plan:  GERD- Deteriorated. I suspect this is cause for her cough and sore throat. Will give trial of rx strength protonix. Follow up in 1 month to assess progress.  Rapid strep is negative.

## 2016-01-28 DIAGNOSIS — K219 Gastro-esophageal reflux disease without esophagitis: Secondary | ICD-10-CM | POA: Diagnosis not present

## 2016-01-31 ENCOUNTER — Other Ambulatory Visit: Payer: Self-pay | Admitting: Family

## 2016-01-31 MED ORDER — AZELASTINE-FLUTICASONE 137-50 MCG/ACT NA SUSP: 1.0000 | Freq: Two times a day (BID) | NASAL | 5 refills | Status: DC

## 2016-02-02 ENCOUNTER — Ambulatory Visit (INDEPENDENT_AMBULATORY_CARE_PROVIDER_SITE_OTHER): Payer: BLUE CROSS/BLUE SHIELD | Admitting: *Deleted

## 2016-02-02 DIAGNOSIS — E538 Deficiency of other specified B group vitamins: Secondary | ICD-10-CM

## 2016-02-02 MED ORDER — CYANOCOBALAMIN 1000 MCG/ML IJ SOLN
1000.0000 ug | Freq: Once | INTRAMUSCULAR | Status: AC
  Administered 2016-02-02: 1000 ug via INTRAMUSCULAR

## 2016-02-02 NOTE — Progress Notes (Signed)
Pre visit review using our clinic review tool, if applicable. No additional management support is needed unless otherwise documented below in the visit note.  Pt tolerated injection well.   Next Appt: 02/15/16 with PCP-would like to discuss doing injections at home.

## 2016-02-15 ENCOUNTER — Ambulatory Visit (INDEPENDENT_AMBULATORY_CARE_PROVIDER_SITE_OTHER): Payer: BLUE CROSS/BLUE SHIELD | Admitting: Family

## 2016-02-15 ENCOUNTER — Encounter: Payer: Self-pay | Admitting: Family

## 2016-02-15 VITALS — BP 121/75 | HR 82 | Temp 98.5°F | Resp 16 | Ht 64.0 in | Wt 185.0 lb

## 2016-02-15 DIAGNOSIS — M26609 Unspecified temporomandibular joint disorder, unspecified side: Secondary | ICD-10-CM

## 2016-02-15 DIAGNOSIS — Z0000 Encounter for general adult medical examination without abnormal findings: Secondary | ICD-10-CM | POA: Insufficient documentation

## 2016-02-15 LAB — CBC WITH DIFFERENTIAL/PLATELET
BASOS ABS: 0 10*3/uL (ref 0.0–0.1)
Basophils Relative: 0.3 % (ref 0.0–3.0)
EOS ABS: 0.1 10*3/uL (ref 0.0–0.7)
Eosinophils Relative: 1.4 % (ref 0.0–5.0)
HEMATOCRIT: 40.6 % (ref 36.0–46.0)
HEMOGLOBIN: 13.6 g/dL (ref 12.0–15.0)
LYMPHS PCT: 32.8 % (ref 12.0–46.0)
Lymphs Abs: 2.1 10*3/uL (ref 0.7–4.0)
MCHC: 33.5 g/dL (ref 30.0–36.0)
MCV: 86.1 fl (ref 78.0–100.0)
Monocytes Absolute: 0.4 10*3/uL (ref 0.1–1.0)
Monocytes Relative: 6.7 % (ref 3.0–12.0)
NEUTROS ABS: 3.7 10*3/uL (ref 1.4–7.7)
Neutrophils Relative %: 58.8 % (ref 43.0–77.0)
PLATELETS: 309 10*3/uL (ref 150.0–400.0)
RBC: 4.71 Mil/uL (ref 3.87–5.11)
RDW: 13.7 % (ref 11.5–15.5)
WBC: 6.3 10*3/uL (ref 4.0–10.5)

## 2016-02-15 LAB — URINALYSIS, ROUTINE W REFLEX MICROSCOPIC
Bilirubin Urine: NEGATIVE
Hgb urine dipstick: NEGATIVE
Ketones, ur: NEGATIVE
Leukocytes, UA: NEGATIVE
Nitrite: NEGATIVE
PH: 7.5 (ref 5.0–8.0)
SPECIFIC GRAVITY, URINE: 1.02 (ref 1.000–1.030)
Total Protein, Urine: NEGATIVE
UROBILINOGEN UA: 1 (ref 0.0–1.0)
Urine Glucose: NEGATIVE

## 2016-02-15 LAB — BASIC METABOLIC PANEL
BUN: 13 mg/dL (ref 6–23)
CALCIUM: 9.1 mg/dL (ref 8.4–10.5)
CO2: 27 mEq/L (ref 19–32)
CREATININE: 0.77 mg/dL (ref 0.40–1.20)
Chloride: 107 mEq/L (ref 96–112)
GFR: 89.17 mL/min (ref >60.00)
Glucose, Bld: 92 mg/dL (ref 70–99)
Potassium: 4.4 mEq/L (ref 3.5–5.1)
SODIUM: 140 meq/L (ref 135–145)

## 2016-02-15 LAB — LIPID PANEL
CHOL/HDL RATIO: 4
Cholesterol: 196 mg/dL (ref 0–200)
HDL: 50.5 mg/dL (ref >39.00)
LDL Cholesterol: 130 mg/dL — ABNORMAL HIGH (ref 0–99)
NONHDL: 145.37
Triglycerides: 75 mg/dL (ref 0.0–149.0)
VLDL: 15 mg/dL (ref 0.0–40.0)

## 2016-02-15 LAB — TSH: TSH: 1.42 u[IU]/mL (ref 0.35–4.50)

## 2016-02-15 LAB — HEPATIC FUNCTION PANEL
ALK PHOS: 59 U/L (ref 39–117)
ALT: 29 U/L (ref 0–35)
AST: 19 U/L (ref 0–37)
Albumin: 4.1 g/dL (ref 3.5–5.2)
BILIRUBIN DIRECT: 0.1 mg/dL (ref 0.0–0.3)
TOTAL PROTEIN: 7.3 g/dL (ref 6.0–8.3)
Total Bilirubin: 0.4 mg/dL (ref 0.2–1.2)

## 2016-02-15 NOTE — Progress Notes (Signed)
Pre visit review using our clinic review tool, if applicable. No additional management support is needed unless otherwise documented below in the visit note. 

## 2016-02-15 NOTE — Assessment & Plan Note (Signed)
Immunizations reviewed and up to date. Obtain routine lab work. Pap smear is up to date.

## 2016-02-15 NOTE — Assessment & Plan Note (Signed)
Suspect that this is contributing to her "ear pain" as a referred pain. I advised her to contact her dentist to discuss custom bite guard.

## 2016-02-15 NOTE — Patient Instructions (Addendum)
Please complete lab work prior to leaving. Consider contacting your dentist to have a bite guard made for sleep.

## 2016-02-15 NOTE — Progress Notes (Signed)
Subjective:    Patient ID: Jaclyn Tucker, female    DOB: September 30, 1975, 40 y.o.   MRN: [id number]  HPI  Patient presents today for complete physical.  Immunizations: tetanus/flu up to date Diet: fair diet, trying to eat healthy Exercise: some exercise but limited by FM symptoms.  Pap Smear: 03/25/15 Vision: up to date (9/17) Dental:  Up to date  Wt Readings from Last 3 Encounters:  02/15/16 185 lb (83.9 kg)  01/25/16 182 lb 12.8 oz (82.9 kg)  12/01/15 178 lb (80.7 kg)   Sore throat/ear pain- reports that she continues to have some sore throat and ear pain.  Reports that she saw ENT and was told "throat looks healthy."  She does have TMJ, she does not have a bite guard.  She tells me that she saw ENT, Dr.  Arville Care who scoped her and told her that her throat looks healthy. She does have hx of TMJ and wonders if her pain might be related to her TMJ. She continues protonix for GERD symptoms.   Review of Systems  Constitutional: Positive for unexpected weight change.  HENT:       Improvement in post nasal drip with dymista  Respiratory:       Mild AM cough which improves as the day goes on  Cardiovascular: Negative for leg swelling.  Gastrointestinal:       IBS (back and forth between diarrhea/constipation)  Genitourinary: Negative for dysuria and frequency.  Musculoskeletal: Positive for myalgias.       Rare joint pain  Neurological:       Rare headaches  Hematological: Negative for adenopathy.  Psychiatric/Behavioral:       Chronic anxiety symptoms   Past Medical History:  Diagnosis Date  . Anxiety   . Depression   . Fibromyalgia 06/2014  . GERD (gastroesophageal reflux disease)   . IBS (irritable bowel syndrome) 11/04/2014  . Migraine   . Vitamin B12 deficiency      Social History   Social History  . Marital status: Married    Spouse name: N/A  . Number of children: 2  . Years of education: N/A   Occupational History  . customer service    Social  History Main Topics  . Smoking status: Former Smoker    Types: Cigarettes    Quit date: 04/13/2014  . Smokeless tobacco: Never Used     Comment: 1 cig/every now and then  . Alcohol use 0.0 oz/week     Comment: 1-2 per month  . Drug use: No  . Sexual activity: Yes    Birth control/ protection: None   Other Topics Concern  . Not on file   Social History Narrative   Marries   2 sons 1999 and 2008   Customer relations Physiological scientist since 2006   No caffeine   11/04/2014       Past Surgical History:  Procedure Laterality Date  . g2 p2      Family History  Problem Relation Age of Onset  . Diabetes Mother   . Hypertension Father   . Colon cancer Paternal Grandmother   . Breast cancer Paternal Grandmother   . Crohn's disease Brother     Allergies  Allergen Reactions  . Penicillins     REACTION: swelling  . Quinolones Anaphylaxis and Palpitations    REACTION: nausea  . Sulfate Rash  . Flexeril [Cyclobenzaprine] Swelling  . Moxifloxacin     REACTION: nausea  . Sumatriptan Succinate  REACTION: nausea, dizziness, panic symptoms    Current Outpatient Prescriptions on File Prior to Visit  Medication Sig Dispense Refill  . ALPRAZolam (XANAX) 0.5 MG tablet Take 0.5 mg by mouth daily as needed.     Marland Kitchen aspirin-acetaminophen-caffeine (EXCEDRIN MIGRAINE) 250-250-65 MG per tablet Take 1 tablet by mouth every 6 (six) hours as needed for headache.    . Azelastine-Fluticasone 137-50 MCG/ACT SUSP Place 1 spray into the nose 2 (two) times daily. 23 g 5  . gabapentin (NEURONTIN) 100 MG capsule Take 1 capsule (100 mg total) by mouth 3 (three) times daily. 90 capsule 5  . loratadine (CLARITIN) 10 MG tablet Take 1 tablet (10 mg total) by mouth daily. 30 tablet 11  . methocarbamol (ROBAXIN) 750 MG tablet Take 1 tablet (750 mg total) by mouth 3 (three) times daily. (Patient taking differently: Take 750 mg by mouth 3 (three) times daily as needed. ) 45 tablet 0  . pantoprazole (PROTONIX)  40 MG tablet Take 1 tablet (40 mg total) by mouth daily. 30 tablet 3  . sertraline (ZOLOFT) 50 MG tablet Take 0.5 tablets by mouth daily.     No current facility-administered medications on file prior to visit.     BP 121/75 (BP Location: Right Arm, Cuff Size: Large)   Pulse 82   Temp 98.5 F (36.9 C) (Oral)   Resp 16   Ht 5\' 4"  (1.626 m)   Wt 185 lb (83.9 kg)   LMP 02/02/2016   SpO2 100%   BMI 31.76 kg/m        Objective:   Physical Exam  Constitutional: She appears well-developed and well-nourished.  HENT:  Head: Normocephalic and atraumatic.  Right Ear: Tympanic membrane and ear canal normal.  Left Ear: Tympanic membrane and ear canal normal.  Mouth/Throat: No oropharyngeal exudate, posterior oropharyngeal edema or posterior oropharyngeal erythema.  Cardiovascular: Normal rate, regular rhythm and normal heart sounds.   No murmur heard. Pulmonary/Chest: Effort normal and breath sounds normal. No respiratory distress. She has no wheezes.  Abdominal: Soft. Bowel sounds are normal. She exhibits no distension. There is no tenderness. There is no rebound and no guarding.  Lymphadenopathy:    She has no cervical adenopathy.  Neurological: She is alert. She exhibits normal muscle tone.  Skin: Skin is warm and dry.  Psychiatric: She has a normal mood and affect. Her behavior is normal. Judgment and thought content normal.  Breast/pelvic: deferred to [facility]:

## 2016-02-16 ENCOUNTER — Encounter: Payer: Self-pay | Admitting: Family

## 2016-02-16 LAB — HIV ANTIBODY (ROUTINE TESTING W REFLEX): HIV 1&2 Ab, 4th Generation: NONREACTIVE

## 2016-02-17 ENCOUNTER — Encounter: Payer: Self-pay | Admitting: Internal Medicine

## 2016-02-17 ENCOUNTER — Ambulatory Visit (INDEPENDENT_AMBULATORY_CARE_PROVIDER_SITE_OTHER): Payer: BLUE CROSS/BLUE SHIELD | Admitting: Internal Medicine

## 2016-02-17 VITALS — BP 110/80 | HR 80 | Ht 64.0 in | Wt 185.2 lb

## 2016-02-17 DIAGNOSIS — J312 Chronic pharyngitis: Secondary | ICD-10-CM | POA: Insufficient documentation

## 2016-02-17 DIAGNOSIS — K219 Gastro-esophageal reflux disease without esophagitis: Secondary | ICD-10-CM

## 2016-02-17 MED ORDER — PANTOPRAZOLE SODIUM 40 MG PO TBEC: 40.0000 mg | DELAYED_RELEASE_TABLET | Freq: Two times a day (BID) | ORAL | 3 refills | Status: DC

## 2016-02-17 NOTE — Progress Notes (Signed)
   Jaclyn Tucker 40 y.o. 03/27/1975 [id number]  Assessment & Plan:   Encounter Diagnoses  Name Primary?  . Chronic sore throat Yes  . Gastroesophageal reflux disease without esophagitis ?    She may have GERD/LPR as a cause of sore throat. Can be hard to tell ebven in setting of signs seen on lexam of layrynx/pharynx.  Will try bid PPI 40 mg pantoprazole x 2-3 mos - if Sxs gone conclude it was GER, if not its probably not GERD - could consider pH/impedance testing  She is and always somewhat anxious about potential health problems related to sxs that are usually innocent  I have been suspicious of functional GI disturbances in past and think what she has now may be functional again - will see if PPI bid works  She may need Tx directed to treat visceral hyperalgesia  RTC 2-3 mos  Cc:O'SULLIVAN,MELISSA S., NP Silvio Clayman, MD  Subjective:   Chief Complaint: sore throat  HPI 40 yo woman here w/ husband - had been seen by me in past w/ heartburn sxs better on PPI - stopped the PPI after a while and then has had several mos of sore throat and also stopped up ears. Rare/occ belch and heartburn - not much she says Saw Dr. Freda Munro - 01/2016 - had no signs of LPR on exam but thought hx c/w LPRhe recommended reflux diet and PPI - she had been on 20 mg pantoprazole just before that visit. She has fairly constant sxs that worsen during the day - occ hoarseness. No sig post-nasal gtt and has tried treatment for that.   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  Review of Systems As above  Objective:   Physical Exam BP 110/80   Pulse 80   Ht 5\' 4"  (1.626 m)   Wt 185 lb 4 oz (84 kg)   LMP 02/02/2016   BMI  31.80 kg/m  NAD Obese Neck supple w/o mass and non-tender Oral and post pharyngeal cavities NL Appropriate mood and affect Alert and oriented x 3  25 minutes time spent with patient > half in counseling coordination of care

## 2016-02-17 NOTE — Patient Instructions (Addendum)
   We have sent the following medications to your pharmacy for you to pick up at your convenience: Pantoprazole    Please come back to see Dr Carlean Purl on February 16th at 4:00pm.     I appreciate the opportunity to care for you. Silvano Rusk, MD, [facility]

## 2016-02-18 ENCOUNTER — Encounter: Payer: Self-pay | Admitting: Internal Medicine

## 2016-02-29 DIAGNOSIS — J209 Acute bronchitis, unspecified: Secondary | ICD-10-CM | POA: Diagnosis not present

## 2016-02-29 DIAGNOSIS — R05 Cough: Secondary | ICD-10-CM | POA: Diagnosis not present

## 2016-03-02 ENCOUNTER — Telehealth: Payer: Self-pay | Admitting: Family

## 2016-03-02 NOTE — Telephone Encounter (Signed)
Relation to [id number] Call back number:[phone]   Reason for call:  Patient scheduled b12 injection with nurse 03/03/16 requesting orders,please advise

## 2016-03-03 ENCOUNTER — Ambulatory Visit (INDEPENDENT_AMBULATORY_CARE_PROVIDER_SITE_OTHER): Payer: BLUE CROSS/BLUE SHIELD | Admitting: Behavioral Health

## 2016-03-03 DIAGNOSIS — E538 Deficiency of other specified B group vitamins: Secondary | ICD-10-CM | POA: Diagnosis not present

## 2016-03-03 MED ORDER — CYANOCOBALAMIN 1000 MCG/ML IJ SOLN
1000.0000 ug | Freq: Once | INTRAMUSCULAR | Status: AC
  Administered 2016-03-03: 1000 ug via INTRAMUSCULAR

## 2016-03-03 NOTE — Progress Notes (Signed)
Noted and  Noted  

## 2016-03-03 NOTE — Progress Notes (Signed)
Pre visit review using our clinic review tool, if applicable. No additional management support is needed unless otherwise documented below in the visit note.  Patient came in office today for B12 injection. IM given in Left Deltoid. Patient tolerated injection well.  She will call the office at a later date to schedule next appointment.

## 2016-03-03 NOTE — Telephone Encounter (Signed)
B12 injection given today. 

## 2016-03-04 DIAGNOSIS — R05 Cough: Secondary | ICD-10-CM | POA: Diagnosis not present

## 2016-03-04 DIAGNOSIS — M545 Low back pain: Secondary | ICD-10-CM | POA: Diagnosis not present

## 2016-03-04 DIAGNOSIS — Z6832 Body mass index (BMI) 32.0-32.9, adult: Secondary | ICD-10-CM | POA: Diagnosis not present

## 2016-03-04 DIAGNOSIS — J40 Bronchitis, not specified as acute or chronic: Secondary | ICD-10-CM | POA: Diagnosis not present

## 2016-03-14 DIAGNOSIS — M5126 Other intervertebral disc displacement, lumbar region: Secondary | ICD-10-CM | POA: Diagnosis not present

## 2016-03-14 DIAGNOSIS — M9903 Segmental and somatic dysfunction of lumbar region: Secondary | ICD-10-CM | POA: Diagnosis not present

## 2016-03-14 DIAGNOSIS — M9905 Segmental and somatic dysfunction of pelvic region: Secondary | ICD-10-CM | POA: Diagnosis not present

## 2016-03-14 DIAGNOSIS — M461 Sacroiliitis, not elsewhere classified: Secondary | ICD-10-CM | POA: Diagnosis not present

## 2016-03-15 DIAGNOSIS — M9905 Segmental and somatic dysfunction of pelvic region: Secondary | ICD-10-CM | POA: Diagnosis not present

## 2016-03-15 DIAGNOSIS — M461 Sacroiliitis, not elsewhere classified: Secondary | ICD-10-CM | POA: Diagnosis not present

## 2016-03-15 DIAGNOSIS — M5126 Other intervertebral disc displacement, lumbar region: Secondary | ICD-10-CM | POA: Diagnosis not present

## 2016-03-15 DIAGNOSIS — M9903 Segmental and somatic dysfunction of lumbar region: Secondary | ICD-10-CM | POA: Diagnosis not present

## 2016-03-16 DIAGNOSIS — M461 Sacroiliitis, not elsewhere classified: Secondary | ICD-10-CM | POA: Diagnosis not present

## 2016-03-16 DIAGNOSIS — M9903 Segmental and somatic dysfunction of lumbar region: Secondary | ICD-10-CM | POA: Diagnosis not present

## 2016-03-16 DIAGNOSIS — M5126 Other intervertebral disc displacement, lumbar region: Secondary | ICD-10-CM | POA: Diagnosis not present

## 2016-03-16 DIAGNOSIS — M9905 Segmental and somatic dysfunction of pelvic region: Secondary | ICD-10-CM | POA: Diagnosis not present

## 2016-03-17 DIAGNOSIS — M9903 Segmental and somatic dysfunction of lumbar region: Secondary | ICD-10-CM | POA: Diagnosis not present

## 2016-03-17 DIAGNOSIS — M9905 Segmental and somatic dysfunction of pelvic region: Secondary | ICD-10-CM | POA: Diagnosis not present

## 2016-03-17 DIAGNOSIS — M461 Sacroiliitis, not elsewhere classified: Secondary | ICD-10-CM | POA: Diagnosis not present

## 2016-03-17 DIAGNOSIS — M5126 Other intervertebral disc displacement, lumbar region: Secondary | ICD-10-CM | POA: Diagnosis not present

## 2016-03-21 DIAGNOSIS — M9905 Segmental and somatic dysfunction of pelvic region: Secondary | ICD-10-CM | POA: Diagnosis not present

## 2016-03-21 DIAGNOSIS — M461 Sacroiliitis, not elsewhere classified: Secondary | ICD-10-CM | POA: Diagnosis not present

## 2016-03-21 DIAGNOSIS — M9903 Segmental and somatic dysfunction of lumbar region: Secondary | ICD-10-CM | POA: Diagnosis not present

## 2016-03-21 DIAGNOSIS — M5126 Other intervertebral disc displacement, lumbar region: Secondary | ICD-10-CM | POA: Diagnosis not present

## 2016-03-22 DIAGNOSIS — M461 Sacroiliitis, not elsewhere classified: Secondary | ICD-10-CM | POA: Diagnosis not present

## 2016-03-22 DIAGNOSIS — M5126 Other intervertebral disc displacement, lumbar region: Secondary | ICD-10-CM | POA: Diagnosis not present

## 2016-03-22 DIAGNOSIS — M9903 Segmental and somatic dysfunction of lumbar region: Secondary | ICD-10-CM | POA: Diagnosis not present

## 2016-03-22 DIAGNOSIS — M9905 Segmental and somatic dysfunction of pelvic region: Secondary | ICD-10-CM | POA: Diagnosis not present

## 2016-03-24 DIAGNOSIS — M5126 Other intervertebral disc displacement, lumbar region: Secondary | ICD-10-CM | POA: Diagnosis not present

## 2016-03-24 DIAGNOSIS — M9903 Segmental and somatic dysfunction of lumbar region: Secondary | ICD-10-CM | POA: Diagnosis not present

## 2016-03-24 DIAGNOSIS — M9905 Segmental and somatic dysfunction of pelvic region: Secondary | ICD-10-CM | POA: Diagnosis not present

## 2016-03-24 DIAGNOSIS — M461 Sacroiliitis, not elsewhere classified: Secondary | ICD-10-CM | POA: Diagnosis not present

## 2016-03-27 ENCOUNTER — Encounter: Payer: Self-pay | Admitting: Family

## 2016-03-27 DIAGNOSIS — M9903 Segmental and somatic dysfunction of lumbar region: Secondary | ICD-10-CM | POA: Diagnosis not present

## 2016-03-27 DIAGNOSIS — M5126 Other intervertebral disc displacement, lumbar region: Secondary | ICD-10-CM | POA: Diagnosis not present

## 2016-03-27 DIAGNOSIS — M9905 Segmental and somatic dysfunction of pelvic region: Secondary | ICD-10-CM | POA: Diagnosis not present

## 2016-03-27 DIAGNOSIS — R5383 Other fatigue: Secondary | ICD-10-CM

## 2016-03-27 DIAGNOSIS — M461 Sacroiliitis, not elsewhere classified: Secondary | ICD-10-CM | POA: Diagnosis not present

## 2016-03-28 ENCOUNTER — Other Ambulatory Visit (INDEPENDENT_AMBULATORY_CARE_PROVIDER_SITE_OTHER): Payer: BLUE CROSS/BLUE SHIELD

## 2016-03-28 DIAGNOSIS — R5383 Other fatigue: Secondary | ICD-10-CM

## 2016-03-28 LAB — MAGNESIUM: MAGNESIUM: 2.1 mg/dL (ref 1.5–2.5)

## 2016-03-28 LAB — VITAMIN B12: VITAMIN B 12: 617 pg/mL (ref 211–911)

## 2016-03-31 DIAGNOSIS — M5126 Other intervertebral disc displacement, lumbar region: Secondary | ICD-10-CM | POA: Diagnosis not present

## 2016-03-31 DIAGNOSIS — M9903 Segmental and somatic dysfunction of lumbar region: Secondary | ICD-10-CM | POA: Diagnosis not present

## 2016-03-31 DIAGNOSIS — M461 Sacroiliitis, not elsewhere classified: Secondary | ICD-10-CM | POA: Diagnosis not present

## 2016-03-31 DIAGNOSIS — M9905 Segmental and somatic dysfunction of pelvic region: Secondary | ICD-10-CM | POA: Diagnosis not present

## 2016-04-03 DIAGNOSIS — F410 Panic disorder [episodic paroxysmal anxiety] without agoraphobia: Secondary | ICD-10-CM | POA: Diagnosis not present

## 2016-04-04 DIAGNOSIS — M9903 Segmental and somatic dysfunction of lumbar region: Secondary | ICD-10-CM | POA: Diagnosis not present

## 2016-04-04 DIAGNOSIS — M9905 Segmental and somatic dysfunction of pelvic region: Secondary | ICD-10-CM | POA: Diagnosis not present

## 2016-04-04 DIAGNOSIS — M461 Sacroiliitis, not elsewhere classified: Secondary | ICD-10-CM | POA: Diagnosis not present

## 2016-04-04 DIAGNOSIS — M5126 Other intervertebral disc displacement, lumbar region: Secondary | ICD-10-CM | POA: Diagnosis not present

## 2016-04-07 DIAGNOSIS — M9903 Segmental and somatic dysfunction of lumbar region: Secondary | ICD-10-CM | POA: Diagnosis not present

## 2016-04-07 DIAGNOSIS — M5126 Other intervertebral disc displacement, lumbar region: Secondary | ICD-10-CM | POA: Diagnosis not present

## 2016-04-07 DIAGNOSIS — M9905 Segmental and somatic dysfunction of pelvic region: Secondary | ICD-10-CM | POA: Diagnosis not present

## 2016-04-07 DIAGNOSIS — M461 Sacroiliitis, not elsewhere classified: Secondary | ICD-10-CM | POA: Diagnosis not present

## 2016-04-10 DIAGNOSIS — M5126 Other intervertebral disc displacement, lumbar region: Secondary | ICD-10-CM | POA: Diagnosis not present

## 2016-04-10 DIAGNOSIS — M461 Sacroiliitis, not elsewhere classified: Secondary | ICD-10-CM | POA: Diagnosis not present

## 2016-04-10 DIAGNOSIS — M9903 Segmental and somatic dysfunction of lumbar region: Secondary | ICD-10-CM | POA: Diagnosis not present

## 2016-04-10 DIAGNOSIS — M9905 Segmental and somatic dysfunction of pelvic region: Secondary | ICD-10-CM | POA: Diagnosis not present

## 2016-04-12 ENCOUNTER — Ambulatory Visit (INDEPENDENT_AMBULATORY_CARE_PROVIDER_SITE_OTHER): Payer: BLUE CROSS/BLUE SHIELD | Admitting: Behavioral Health

## 2016-04-12 DIAGNOSIS — E538 Deficiency of other specified B group vitamins: Secondary | ICD-10-CM | POA: Diagnosis not present

## 2016-04-12 MED ORDER — CYANOCOBALAMIN 1000 MCG/ML IJ SOLN
1000.0000 ug | Freq: Once | INTRAMUSCULAR | Status: AC
  Administered 2016-04-12: 1000 ug via INTRAMUSCULAR

## 2016-04-12 NOTE — Progress Notes (Addendum)
Pre visit review using our clinic review tool, if applicable. No additional management support is needed unless otherwise documented below in the visit note.  Patient in clinic for B12 injection. IM given in Left Deltoid. Patient tolerated injection well.  Reviewed RN note in pt with dx b-12 deficiency. Ok that was given.  Saguier, Percell Miller, PA-C

## 2016-04-13 LAB — HM PAP SMEAR: HM Pap smear: NORMAL

## 2016-04-14 DIAGNOSIS — M5126 Other intervertebral disc displacement, lumbar region: Secondary | ICD-10-CM | POA: Diagnosis not present

## 2016-04-14 DIAGNOSIS — M9903 Segmental and somatic dysfunction of lumbar region: Secondary | ICD-10-CM | POA: Diagnosis not present

## 2016-04-14 DIAGNOSIS — M461 Sacroiliitis, not elsewhere classified: Secondary | ICD-10-CM | POA: Diagnosis not present

## 2016-04-14 DIAGNOSIS — M9905 Segmental and somatic dysfunction of pelvic region: Secondary | ICD-10-CM | POA: Diagnosis not present

## 2016-04-18 ENCOUNTER — Encounter: Payer: Self-pay | Admitting: Family

## 2016-04-19 DIAGNOSIS — M9905 Segmental and somatic dysfunction of pelvic region: Secondary | ICD-10-CM | POA: Diagnosis not present

## 2016-04-19 DIAGNOSIS — M5126 Other intervertebral disc displacement, lumbar region: Secondary | ICD-10-CM | POA: Diagnosis not present

## 2016-04-19 DIAGNOSIS — M9903 Segmental and somatic dysfunction of lumbar region: Secondary | ICD-10-CM | POA: Diagnosis not present

## 2016-04-19 DIAGNOSIS — M461 Sacroiliitis, not elsewhere classified: Secondary | ICD-10-CM | POA: Diagnosis not present

## 2016-04-21 DIAGNOSIS — M461 Sacroiliitis, not elsewhere classified: Secondary | ICD-10-CM | POA: Diagnosis not present

## 2016-04-21 DIAGNOSIS — M9905 Segmental and somatic dysfunction of pelvic region: Secondary | ICD-10-CM | POA: Diagnosis not present

## 2016-04-21 DIAGNOSIS — M9903 Segmental and somatic dysfunction of lumbar region: Secondary | ICD-10-CM | POA: Diagnosis not present

## 2016-04-21 DIAGNOSIS — M5126 Other intervertebral disc displacement, lumbar region: Secondary | ICD-10-CM | POA: Diagnosis not present

## 2016-04-28 ENCOUNTER — Ambulatory Visit (INDEPENDENT_AMBULATORY_CARE_PROVIDER_SITE_OTHER): Payer: BLUE CROSS/BLUE SHIELD | Admitting: Internal Medicine

## 2016-04-28 ENCOUNTER — Encounter: Payer: Self-pay | Admitting: Internal Medicine

## 2016-04-28 VITALS — BP 112/68 | HR 71 | Ht 64.0 in | Wt 189.0 lb

## 2016-04-28 DIAGNOSIS — J312 Chronic pharyngitis: Secondary | ICD-10-CM | POA: Diagnosis not present

## 2016-04-28 DIAGNOSIS — K219 Gastro-esophageal reflux disease without esophagitis: Secondary | ICD-10-CM | POA: Diagnosis not present

## 2016-04-28 NOTE — Patient Instructions (Signed)
   Glad you are better. I think Debbrah Alar can refill the pantoprazole but will see you as needed.  I appreciate the opportunity to care for you. Gatha Mayer, MD, [facility]

## 2016-04-28 NOTE — Progress Notes (Signed)
   Natesha Placeres 41 y.o. 07/06/1975 [id number]  Assessment & Plan:   1. Laryngopharyngeal reflux (LPR)   2. Chronic sore throat     Twice a day PPI has relieved her sore throat symptoms so I concluded she has LPR. She will stay on twice a day PPI, she can try to back off again in the future though recently she tried this and symptoms returned so we'll continue. I've asked her to see she can get that refill to her primary care provider and see me as needed otherwise.  I appreciate the opportunity to care for this patient. CC: O'SULLIVAN,MELISSA S., NP      Subjective:   Chief Complaint: Follow-up of sore throat  HPI The patient returns, she was seen in December with chronic sore throat symptoms. He went on twice a day pantoprazole and had a good response. Tried to reduce to daily dosing again and had recurrence of sore throat symptoms. Now back on twice a day and doing well.  Medications, allergies, past medical history, past surgical history, family history and social history are reviewed and updated in the EMR.  Review of Systems As above  Objective:   Physical Exam BP 112/68   Pulse 71   Ht 5\' 4"  (1.626 m)   Wt 189 lb (85.7 kg)   BMI 32.44 kg/m

## 2016-05-04 DIAGNOSIS — M5126 Other intervertebral disc displacement, lumbar region: Secondary | ICD-10-CM | POA: Diagnosis not present

## 2016-05-04 DIAGNOSIS — M9905 Segmental and somatic dysfunction of pelvic region: Secondary | ICD-10-CM | POA: Diagnosis not present

## 2016-05-04 DIAGNOSIS — K644 Residual hemorrhoidal skin tags: Secondary | ICD-10-CM | POA: Diagnosis not present

## 2016-05-04 DIAGNOSIS — M461 Sacroiliitis, not elsewhere classified: Secondary | ICD-10-CM | POA: Diagnosis not present

## 2016-05-04 DIAGNOSIS — Z6832 Body mass index (BMI) 32.0-32.9, adult: Secondary | ICD-10-CM | POA: Diagnosis not present

## 2016-05-04 DIAGNOSIS — M9903 Segmental and somatic dysfunction of lumbar region: Secondary | ICD-10-CM | POA: Diagnosis not present

## 2016-05-09 ENCOUNTER — Other Ambulatory Visit: Payer: Self-pay | Admitting: Obstetrics and Gynecology

## 2016-05-09 DIAGNOSIS — Z6832 Body mass index (BMI) 32.0-32.9, adult: Secondary | ICD-10-CM | POA: Diagnosis not present

## 2016-05-09 DIAGNOSIS — Z01419 Encounter for gynecological examination (general) (routine) without abnormal findings: Secondary | ICD-10-CM | POA: Diagnosis not present

## 2016-05-09 DIAGNOSIS — Z124 Encounter for screening for malignant neoplasm of cervix: Secondary | ICD-10-CM | POA: Diagnosis not present

## 2016-05-10 LAB — CYTOLOGY - PAP

## 2016-05-11 ENCOUNTER — Encounter: Payer: Self-pay | Admitting: Family Medicine

## 2016-05-11 ENCOUNTER — Ambulatory Visit (INDEPENDENT_AMBULATORY_CARE_PROVIDER_SITE_OTHER): Payer: BLUE CROSS/BLUE SHIELD | Admitting: Family Medicine

## 2016-05-11 ENCOUNTER — Ambulatory Visit (HOSPITAL_BASED_OUTPATIENT_CLINIC_OR_DEPARTMENT_OTHER)
Admission: RE | Admit: 2016-05-11 | Discharge: 2016-05-11 | Disposition: A | Discharge status: Home / Self Care | Payer: BLUE CROSS/BLUE SHIELD | Source: Ambulatory Visit | Attending: Family Medicine | Admitting: Family Medicine

## 2016-05-11 VITALS — BP 128/70 | HR 95 | Temp 98.5°F | Resp 16 | Ht 64.0 in | Wt 186.4 lb

## 2016-05-11 DIAGNOSIS — R109 Unspecified abdominal pain: Secondary | ICD-10-CM

## 2016-05-11 DIAGNOSIS — M549 Dorsalgia, unspecified: Secondary | ICD-10-CM | POA: Diagnosis not present

## 2016-05-11 DIAGNOSIS — R350 Frequency of micturition: Secondary | ICD-10-CM | POA: Diagnosis not present

## 2016-05-11 LAB — COMPREHENSIVE METABOLIC PANEL
ALT: 19 U/L (ref 0–35)
AST: 16 U/L (ref 0–37)
Albumin: 4.4 g/dL (ref 3.5–5.2)
Alkaline Phosphatase: 59 U/L (ref 39–117)
BUN: 12 mg/dL (ref 6–23)
CHLORIDE: 106 meq/L (ref 96–112)
CO2: 25 meq/L (ref 19–32)
Calcium: 9.7 mg/dL (ref 8.4–10.5)
Creatinine, Ser: 0.96 mg/dL (ref 0.40–1.20)
GFR: 69.05 mL/min (ref >60.00)
GLUCOSE: 111 mg/dL — AB (ref 70–99)
POTASSIUM: 4.4 meq/L (ref 3.5–5.1)
SODIUM: 139 meq/L (ref 135–145)
Total Bilirubin: 0.5 mg/dL (ref 0.2–1.2)
Total Protein: 8.1 g/dL (ref 6.0–8.3)

## 2016-05-11 LAB — CBC
HEMATOCRIT: 43.5 % (ref 36.0–46.0)
Hemoglobin: 14.4 g/dL (ref 12.0–15.0)
MCHC: 33 g/dL (ref 30.0–36.0)
MCV: 88.2 fl (ref 78.0–100.0)
Platelets: 324 10*3/uL (ref 150.0–400.0)
RBC: 4.93 Mil/uL (ref 3.87–5.11)
RDW: 13.5 % (ref 11.5–15.5)
WBC: 6.7 10*3/uL (ref 4.0–10.5)

## 2016-05-11 LAB — POC URINALSYSI DIPSTICK (AUTOMATED)
BILIRUBIN UA: NEGATIVE
GLUCOSE UA: NEGATIVE
KETONES UA: NEGATIVE
Leukocytes, UA: NEGATIVE
Nitrite, UA: NEGATIVE
Protein, UA: NEGATIVE
RBC UA: NEGATIVE
Spec Grav, UA: 1.02
UROBILINOGEN UA: NEGATIVE
pH, UA: 6

## 2016-05-11 NOTE — Progress Notes (Signed)
Subjective:    Patient ID: Jaclyn Tucker, female    DOB: [birth year], 41 y.o.   MRN: [id number]   I acted as a Education administrator for Dr. Royden Purl, LPN   Chief Complaint  Patient presents with  . Urinary Tract Infection    pt c/o frequent urination, back pain, and pain with urination for about 3-4 days.  . Back Pain  . Abdominal Pain    Abdominal Pain  This is a new problem. The current episode started in the past 7 days. The onset quality is undetermined. The problem occurs constantly. The pain is at a severity of 9/10. The pain is moderate. Pertinent negatives include no fever, headaches or vomiting.  Back Pain  This is a new problem. The current episode started in the past 7 days. The problem occurs constantly. The pain is at a severity of 9/10. The pain is moderate. The pain is the same all the time. Associated symptoms include abdominal pain. Pertinent negatives include no chest pain, fever or headaches. The treatment provided no relief.    Patient is in today for frequent urination, back pain, abdominal pain, and pain with urination for about 3-4 days.  Past Medical History:  Diagnosis Date  . Anxiety   . Depression   . Fibromyalgia 06/2014  . GERD (gastroesophageal reflux disease)   . IBS (irritable bowel syndrome) 11/04/2014  . Migraine   . Vitamin B12 deficiency     Past Surgical History:  Procedure Laterality Date  . g2 p2      Family History  Problem Relation Age of Onset  . Diabetes Mother   . Hypertension Father   . Colon cancer Paternal Grandmother   . Breast cancer Paternal Grandmother   . Crohn's disease Brother     Social History   Social History  . Marital status: Married    Spouse name: N/A  . Number of children: 2  . Years of education: N/A   Occupational History  . customer service    Social History Main Topics  . Smoking status: Former Smoker    Types: Cigarettes    Quit date: 04/13/2014  . Smokeless tobacco: Never Used  . Alcohol use 0.0  oz/week     Comment: 1-2 per month  . Drug use: No  . Sexual activity: Yes    Birth control/ protection: None   Other Topics Concern  . Not on file   Social History Narrative   Marries   2 sons 1999 and 2008   Customer relations Physiological scientist since 2006   No caffeine   11/04/2014       Outpatient Medications Prior to Visit  Medication Sig Dispense Refill  . ALPRAZolam (XANAX) 0.5 MG tablet Take 0.5 mg by mouth daily as needed.     Marland Kitchen aspirin-acetaminophen-caffeine (EXCEDRIN MIGRAINE) 250-250-65 MG per tablet Take 1 tablet by mouth every 6 (six) hours as needed for headache.    . Azelastine-Fluticasone 137-50 MCG/ACT SUSP Place 1 spray into the nose 2 (two) times daily. 23 g 5  . gabapentin (NEURONTIN) 100 MG capsule Take 1 capsule (100 mg total) by mouth 3 (three) times daily. 90 capsule 5  . methocarbamol (ROBAXIN) 750 MG tablet Take 1 tablet (750 mg total) by mouth 3 (three) times daily. (Patient taking differently: Take 750 mg by mouth 3 (three) times daily as needed. ) 45 tablet 0  . pantoprazole (PROTONIX) 40 MG tablet Take 1 tablet (40 mg total) by mouth 2 (two) times daily  before a meal. 60 tablet 3  . sertraline (ZOLOFT) 25 MG tablet Take 25 mg by mouth daily.     No facility-administered medications prior to visit.     Allergies  Allergen Reactions  . Penicillins     REACTION: swelling  . Quinolones Anaphylaxis and Palpitations    REACTION: nausea  . Sulfate Rash  . Flexeril [Cyclobenzaprine] Swelling  . Moxifloxacin     REACTION: nausea  . Sumatriptan Succinate     REACTION: nausea, dizziness, panic symptoms    Review of Systems  Constitutional: Negative for fever.  HENT: Negative for congestion.   Eyes: Negative for blurred vision.  Respiratory: Negative for cough.   Cardiovascular: Negative for chest pain and palpitations.  Gastrointestinal: Positive for abdominal pain. Negative for vomiting.  Musculoskeletal: Positive for back pain.  Skin: Negative  for rash.  Neurological: Negative for loss of consciousness and headaches.       Objective:    Physical Exam  Constitutional: She is oriented to person, place, and time. She appears well-developed and well-nourished.  HENT:  Head: Normocephalic and atraumatic.  Eyes: Conjunctivae and EOM are normal.  Neck: Normal range of motion. Neck supple. No JVD present. Carotid bruit is not present. No thyromegaly present.  Cardiovascular: Normal rate, regular rhythm and normal heart sounds.   No murmur heard. Pulmonary/Chest: Effort normal and breath sounds normal. No respiratory distress. She has no wheezes. She has no rales. She exhibits no tenderness.  Abdominal: Bowel sounds are normal. She exhibits no distension and no mass. There is no tenderness. There is no rebound and no guarding.  Musculoskeletal: She exhibits tenderness. She exhibits no edema.       Arms: Neurological: She is alert and oriented to person, place, and time.  Psychiatric: She has a normal mood and affect.  Nursing note and vitals reviewed.   BP 128/70 (BP Location: Left Arm, Patient Position: Sitting, Cuff Size: Normal)   Pulse 95   Temp 98.5 F (36.9 C) (Oral)   Resp 16   Ht 5\' 4"  (1.626 m)   Wt 186 lb 6.4 oz (84.6 kg)   LMP 04/22/2016   SpO2 98%   BMI 32.00 kg/m  Wt Readings from Last 3 Encounters:  05/11/16 186 lb 6.4 oz (84.6 kg)  04/28/16 189 lb (85.7 kg)  02/17/16 185 lb 4 oz (84 kg)     Lab Results  Component Value Date   WBC 6.7 05/11/2016   HGB 14.4 05/11/2016   HCT 43.5 05/11/2016   PLT 324.0 05/11/2016   GLUCOSE 111 (H) 05/11/2016   CHOL 196 02/15/2016   TRIG 75.0 02/15/2016   HDL 50.50 02/15/2016   LDLCALC 130 (H) 02/15/2016   ALT 19 05/11/2016   AST 16 05/11/2016   NA 139 05/11/2016   K 4.4 05/11/2016   CL 106 05/11/2016   CREATININE 0.96 05/11/2016   BUN 12 05/11/2016   CO2 25 05/11/2016   TSH 1.42 02/15/2016    Lab Results  Component Value Date   TSH 1.42 02/15/2016    Lab Results  Component Value Date   WBC 6.7 05/11/2016   HGB 14.4 05/11/2016   HCT 43.5 05/11/2016   MCV 88.2 05/11/2016   PLT 324.0 05/11/2016   Lab Results  Component Value Date   NA 139 05/11/2016   K 4.4 05/11/2016   CO2 25 05/11/2016   GLUCOSE 111 (H) 05/11/2016   BUN 12 05/11/2016   CREATININE 0.96 05/11/2016   BILITOT 0.5 05/11/2016  ALKPHOS 59 05/11/2016   AST 16 05/11/2016   ALT 19 05/11/2016   PROT 8.1 05/11/2016   ALBUMIN 4.4 05/11/2016   CALCIUM 9.7 05/11/2016   ANIONGAP 7 07/06/2014   GFR 69.05 05/11/2016   Lab Results  Component Value Date   CHOL 196 02/15/2016   Lab Results  Component Value Date   HDL 50.50 02/15/2016   Lab Results  Component Value Date   LDLCALC 130 (H) 02/15/2016   Lab Results  Component Value Date   TRIG 75.0 02/15/2016   Lab Results  Component Value Date   CHOLHDL 4 02/15/2016   No results found for: HGBA1C     Assessment & Plan:   Problem List Items Addressed This Visit      Unprioritized   Increased urinary frequency    Other Visit Diagnoses    Frequent urination    -  Primary   Relevant Orders   POCT Urinalysis Dipstick (Automated) (Completed)   Urine Culture   CBC (Completed)   Comprehensive metabolic panel (Completed)   Left flank pain       Relevant Orders   Urine Culture   CBC (Completed)   Comprehensive metabolic panel (Completed)   CT RENAL STONE STUDY (Completed)      I am having Ms. Birkhead maintain her aspirin-acetaminophen-caffeine, ALPRAZolam, methocarbamol, gabapentin, Azelastine-Fluticasone, sertraline, and pantoprazole.  No orders of the defined types were placed in this encounter.   CMA served as Education administrator during this visit. History, Physical and Plan performed by medical provider. Documentation and orders reviewed and attested to.  Ann Held, DO Patient ID: Jaclyn Tucker, female   DOB: [birth year], 41 y.o.   MRN: [id number]

## 2016-05-11 NOTE — Progress Notes (Signed)
Pre visit review using our clinic review tool, if applicable. No additional management support is needed unless otherwise documented below in the visit note. 

## 2016-05-11 NOTE — Patient Instructions (Signed)

## 2016-05-12 LAB — URINE CULTURE

## 2016-05-16 DIAGNOSIS — M461 Sacroiliitis, not elsewhere classified: Secondary | ICD-10-CM | POA: Diagnosis not present

## 2016-05-16 DIAGNOSIS — M9905 Segmental and somatic dysfunction of pelvic region: Secondary | ICD-10-CM | POA: Diagnosis not present

## 2016-05-16 DIAGNOSIS — M9903 Segmental and somatic dysfunction of lumbar region: Secondary | ICD-10-CM | POA: Diagnosis not present

## 2016-05-16 DIAGNOSIS — M5126 Other intervertebral disc displacement, lumbar region: Secondary | ICD-10-CM | POA: Diagnosis not present

## 2016-05-26 DIAGNOSIS — F410 Panic disorder [episodic paroxysmal anxiety] without agoraphobia: Secondary | ICD-10-CM | POA: Diagnosis not present

## 2016-05-29 ENCOUNTER — Encounter: Payer: Self-pay | Admitting: Neurology

## 2016-05-29 ENCOUNTER — Telehealth: Payer: Self-pay

## 2016-05-29 DIAGNOSIS — F410 Panic disorder [episodic paroxysmal anxiety] without agoraphobia: Secondary | ICD-10-CM | POA: Diagnosis not present

## 2016-05-29 DIAGNOSIS — M9905 Segmental and somatic dysfunction of pelvic region: Secondary | ICD-10-CM | POA: Diagnosis not present

## 2016-05-29 DIAGNOSIS — M461 Sacroiliitis, not elsewhere classified: Secondary | ICD-10-CM | POA: Diagnosis not present

## 2016-05-29 DIAGNOSIS — M5126 Other intervertebral disc displacement, lumbar region: Secondary | ICD-10-CM | POA: Diagnosis not present

## 2016-05-29 DIAGNOSIS — M9903 Segmental and somatic dysfunction of lumbar region: Secondary | ICD-10-CM | POA: Diagnosis not present

## 2016-05-29 NOTE — Telephone Encounter (Signed)
Pt sent MyChart message. Called patient and informed per Dr. Delice Lesch:  Pls let her know Dr. Tomi Likens is out, I reviewed both EMGs and they are reassuring there is nothing bad going on, but if any other recommendations, would wait until Dr. Tomi Likens back. Thanks.  Patient was grateful.

## 2016-06-08 ENCOUNTER — Ambulatory Visit (INDEPENDENT_AMBULATORY_CARE_PROVIDER_SITE_OTHER): Payer: BLUE CROSS/BLUE SHIELD | Admitting: Behavioral Health

## 2016-06-08 DIAGNOSIS — E538 Deficiency of other specified B group vitamins: Secondary | ICD-10-CM

## 2016-06-08 MED ORDER — CYANOCOBALAMIN 1000 MCG/ML IJ SOLN
1000.0000 ug | Freq: Once | INTRAMUSCULAR | Status: AC
  Administered 2016-06-08: 1000 ug via INTRAMUSCULAR

## 2016-06-08 NOTE — Progress Notes (Addendum)
Pre visit review using our clinic review tool, if applicable. No additional management support is needed unless otherwise documented below in the visit note.  Patient in office for B12 injection. IM injection given in left deltoid. Patient tolerated it well.  She will call to schedule her next appointment at a later time.  Reviewed and agree with administration of b12.  Saguier, Percell Miller, PA-C

## 2016-07-03 DIAGNOSIS — M9905 Segmental and somatic dysfunction of pelvic region: Secondary | ICD-10-CM | POA: Diagnosis not present

## 2016-07-03 DIAGNOSIS — M9903 Segmental and somatic dysfunction of lumbar region: Secondary | ICD-10-CM | POA: Diagnosis not present

## 2016-07-03 DIAGNOSIS — M5126 Other intervertebral disc displacement, lumbar region: Secondary | ICD-10-CM | POA: Diagnosis not present

## 2016-07-03 DIAGNOSIS — F410 Panic disorder [episodic paroxysmal anxiety] without agoraphobia: Secondary | ICD-10-CM | POA: Diagnosis not present

## 2016-07-03 DIAGNOSIS — M461 Sacroiliitis, not elsewhere classified: Secondary | ICD-10-CM | POA: Diagnosis not present

## 2016-07-10 ENCOUNTER — Ambulatory Visit (INDEPENDENT_AMBULATORY_CARE_PROVIDER_SITE_OTHER): Payer: BLUE CROSS/BLUE SHIELD | Admitting: Family

## 2016-07-10 ENCOUNTER — Encounter: Payer: Self-pay | Admitting: Family

## 2016-07-10 VITALS — BP 127/82 | HR 81 | Temp 98.3°F | Resp 16 | Ht 64.0 in | Wt 187.2 lb

## 2016-07-10 DIAGNOSIS — M2550 Pain in unspecified joint: Secondary | ICD-10-CM

## 2016-07-10 DIAGNOSIS — M79641 Pain in right hand: Secondary | ICD-10-CM

## 2016-07-10 DIAGNOSIS — M79642 Pain in left hand: Secondary | ICD-10-CM

## 2016-07-10 NOTE — Progress Notes (Signed)
Pre visit review using our clinic review tool, if applicable. No additional management support is needed unless otherwise documented below in the visit note. 

## 2016-07-10 NOTE — Patient Instructions (Signed)
Please schedule a lab appointment at the front desk.

## 2016-07-10 NOTE — Progress Notes (Signed)
Subjective:    Patient ID: Jaclyn Tucker, female    DOB: December 06, 1975, 41 y.o.   MRN: [id number]  HPI  Jaclyn Tucker is a 41 yr old female who presents today with chief complaint of hand pain. Pain started about 11 months ago. Notes that hands are most stiff in the AM and seem to improve as the day goes on. Reports that neuro told her not carpal tunnel.    Review of Systems See HPI  Past Medical History:  Diagnosis Date  . Anxiety   . Depression   . Fibromyalgia 06/2014  . GERD (gastroesophageal reflux disease)   . IBS (irritable bowel syndrome) 11/04/2014  . Migraine   . Vitamin B12 deficiency      Social History   Social History  . Marital status: Married    Spouse name: N/A  . Number of children: 2  . Years of education: N/A   Occupational History  . customer service    Social History Main Topics  . Smoking status: Former Smoker    Types: Cigarettes    Quit date: 04/13/2014  . Smokeless tobacco: Never Used  . Alcohol use 0.0 oz/week     Comment: 1-2 per month  . Drug use: No  . Sexual activity: Yes    Birth control/ protection: None   Other Topics Concern  . Not on file   Social History Narrative   Marries   2 sons 1999 and 2008   Customer relations Physiological scientist since 2006   No caffeine   11/04/2014       Past Surgical History:  Procedure Laterality Date  . g2 p2      Family History  Problem Relation Age of Onset  . Diabetes Mother   . Hypertension Father   . Colon cancer Paternal Grandmother   . Breast cancer Paternal Grandmother   . Crohn's disease Brother     Allergies  Allergen Reactions  . Bee Venom Swelling  . Penicillins     REACTION: swelling  . Quinolones Anaphylaxis and Palpitations    REACTION: nausea  . Sulfate Rash  . Celexa [Citalopram Hydrobromide] Other (See Comments)    Panic attacks  . Duloxetine Other (See Comments)    Panic attacks  . Escitalopram Other (See Comments)    Panic attacks  . Flexeril  [Cyclobenzaprine] Swelling  . Moxifloxacin     REACTION: nausea  . Ranitidine Hcl     Other reaction(s): Other (See Comments) uknown  . Sumatriptan Succinate     REACTION: nausea, dizziness, panic symptoms    Current Outpatient Prescriptions on File Prior to Visit  Medication Sig Dispense Refill  . ALPRAZolam (XANAX) 0.5 MG tablet Take 0.5 mg by mouth daily as needed.     Marland Kitchen aspirin-acetaminophen-caffeine (EXCEDRIN MIGRAINE) 250-250-65 MG per tablet Take 1 tablet by mouth every 6 (six) hours as needed for headache.    . Azelastine-Fluticasone 137-50 MCG/ACT SUSP Place 1 spray into the nose 2 (two) times daily. 23 g 5  . gabapentin (NEURONTIN) 100 MG capsule Take 1 capsule (100 mg total) by mouth 3 (three) times daily. 90 capsule 5  . methocarbamol (ROBAXIN) 750 MG tablet Take 1 tablet (750 mg total) by mouth 3 (three) times daily. (Patient taking differently: Take 750 mg by mouth 3 (three) times daily as needed. ) 45 tablet 0  . pantoprazole (PROTONIX) 40 MG tablet Take 1 tablet (40 mg total) by mouth 2 (two) times daily before a meal. 60 tablet 3  .  sertraline (ZOLOFT) 25 MG tablet Take 25 mg by mouth daily.     No current facility-administered medications on file prior to visit.     BP 127/82 (BP Location: Right Arm, Cuff Size: Large)   Pulse 81   Temp 98.3 F (36.8 C) (Oral)   Resp 16   Ht '5\' 4"'  (1.626 m)   Wt 187 lb 3.2 oz (84.9 kg)   LMP 06/15/2016   SpO2 100% Comment: room air  BMI 32.13 kg/m       Objective:   Physical Exam  Constitutional: She is oriented to person, place, and time. She appears well-developed and well-nourished.  HENT:  Head: Normocephalic and atraumatic.  Cardiovascular: Normal rate, regular rhythm and normal heart sounds.   No murmur heard. Pulmonary/Chest: Effort normal and breath sounds normal. No respiratory distress. She has no wheezes.  Musculoskeletal: She exhibits no edema.  No joint deformities noted on hands  Neurological: She is alert  and oriented to person, place, and time.  Psychiatric: She has a normal mood and affect. Her behavior is normal. Judgment and thought content normal.          Assessment & Plan:  Hand pain- RA, ANA, ESR, TSH, CBC, Uric acid- all WNL.  I suspect that her hand pain is due to osteoarthritis most likely.  Will give trial of PRN Tylenol.

## 2016-07-11 ENCOUNTER — Other Ambulatory Visit: Payer: BLUE CROSS/BLUE SHIELD

## 2016-07-11 DIAGNOSIS — M2550 Pain in unspecified joint: Secondary | ICD-10-CM | POA: Diagnosis not present

## 2016-07-11 LAB — CBC WITH DIFFERENTIAL/PLATELET
BASOS PCT: 0.5 % (ref 0.0–3.0)
Basophils Absolute: 0 10*3/uL (ref 0.0–0.1)
EOS PCT: 2 % (ref 0.0–5.0)
Eosinophils Absolute: 0.1 10*3/uL (ref 0.0–0.7)
HCT: 39.7 % (ref 36.0–46.0)
HEMOGLOBIN: 13.3 g/dL (ref 12.0–15.0)
LYMPHS ABS: 2.5 10*3/uL (ref 0.7–4.0)
Lymphocytes Relative: 35.7 % (ref 12.0–46.0)
MCHC: 33.5 g/dL (ref 30.0–36.0)
MCV: 87.3 fl (ref 78.0–100.0)
MONO ABS: 0.5 10*3/uL (ref 0.1–1.0)
Monocytes Relative: 7.3 % (ref 3.0–12.0)
NEUTROS ABS: 3.8 10*3/uL (ref 1.4–7.7)
NEUTROS PCT: 54.5 % (ref 43.0–77.0)
PLATELETS: 339 10*3/uL (ref 150.0–400.0)
RBC: 4.54 Mil/uL (ref 3.87–5.11)
RDW: 13 % (ref 11.5–15.5)
WBC: 6.9 10*3/uL (ref 4.0–10.5)

## 2016-07-11 LAB — URIC ACID: Uric Acid, Serum: 4.1 mg/dL (ref 2.4–7.0)

## 2016-07-11 LAB — SEDIMENTATION RATE: SED RATE: 11 mm/h (ref 0–20)

## 2016-07-11 LAB — TSH: TSH: 2.79 u[IU]/mL (ref 0.35–4.50)

## 2016-07-12 ENCOUNTER — Encounter: Payer: Self-pay | Admitting: Family

## 2016-07-12 DIAGNOSIS — M79642 Pain in left hand: Principal | ICD-10-CM

## 2016-07-12 DIAGNOSIS — M79641 Pain in right hand: Secondary | ICD-10-CM

## 2016-07-12 LAB — ANA: ANA: NEGATIVE

## 2016-07-12 LAB — RHEUMATOID FACTOR: Rhuematoid fact SerPl-aCnc: <14 IU/mL (ref <14)

## 2016-07-13 ENCOUNTER — Other Ambulatory Visit: Payer: Self-pay | Admitting: Family

## 2016-07-13 ENCOUNTER — Ambulatory Visit (HOSPITAL_BASED_OUTPATIENT_CLINIC_OR_DEPARTMENT_OTHER)
Admission: RE | Admit: 2016-07-13 | Discharge: 2016-07-13 | Disposition: A | Discharge status: Home / Self Care | Payer: BLUE CROSS/BLUE SHIELD | Source: Ambulatory Visit | Attending: Family | Admitting: Family

## 2016-07-13 DIAGNOSIS — M79642 Pain in left hand: Secondary | ICD-10-CM | POA: Insufficient documentation

## 2016-07-13 DIAGNOSIS — M79643 Pain in unspecified hand: Secondary | ICD-10-CM | POA: Diagnosis not present

## 2016-07-13 DIAGNOSIS — M79641 Pain in right hand: Secondary | ICD-10-CM | POA: Insufficient documentation

## 2016-07-13 MED ORDER — EPINEPHRINE 0.3 MG/0.3ML IJ SOAJ: 0.3000 mg | Freq: Once | INTRAMUSCULAR | 0 refills | Status: AC

## 2016-07-13 NOTE — Addendum Note (Signed)
Addended by: Debbrah Alar on: 07/13/2016 03:19 PM   Modules accepted: Orders

## 2016-07-14 ENCOUNTER — Encounter: Payer: Self-pay | Admitting: Family

## 2016-07-28 ENCOUNTER — Ambulatory Visit (INDEPENDENT_AMBULATORY_CARE_PROVIDER_SITE_OTHER): Payer: BLUE CROSS/BLUE SHIELD

## 2016-07-28 DIAGNOSIS — E538 Deficiency of other specified B group vitamins: Secondary | ICD-10-CM

## 2016-07-28 MED ORDER — CYANOCOBALAMIN 1000 MCG/ML IJ SOLN
1000.0000 ug | Freq: Once | INTRAMUSCULAR | Status: AC
  Administered 2016-07-28: 1000 ug via INTRAMUSCULAR

## 2016-07-28 NOTE — Progress Notes (Signed)
Patient in for B12 injection per order from M. O'Sullivan,NP  Patient given 1000 mcg IM right deltoid. No complaints voiced.  Patient state she will call back to scheduled next appointment.

## 2016-07-28 NOTE — Progress Notes (Signed)
Noted  

## 2016-08-17 DIAGNOSIS — M461 Sacroiliitis, not elsewhere classified: Secondary | ICD-10-CM | POA: Diagnosis not present

## 2016-08-17 DIAGNOSIS — M9903 Segmental and somatic dysfunction of lumbar region: Secondary | ICD-10-CM | POA: Diagnosis not present

## 2016-08-17 DIAGNOSIS — M9905 Segmental and somatic dysfunction of pelvic region: Secondary | ICD-10-CM | POA: Diagnosis not present

## 2016-08-17 DIAGNOSIS — M5126 Other intervertebral disc displacement, lumbar region: Secondary | ICD-10-CM | POA: Diagnosis not present

## 2016-09-15 ENCOUNTER — Ambulatory Visit (INDEPENDENT_AMBULATORY_CARE_PROVIDER_SITE_OTHER): Payer: BLUE CROSS/BLUE SHIELD | Admitting: Behavioral Health

## 2016-09-15 DIAGNOSIS — E538 Deficiency of other specified B group vitamins: Secondary | ICD-10-CM | POA: Diagnosis not present

## 2016-09-15 MED ORDER — CYANOCOBALAMIN 1000 MCG/ML IJ SOLN
1000.0000 ug | Freq: Once | INTRAMUSCULAR | Status: AC
  Administered 2016-09-15: 1000 ug via INTRAMUSCULAR

## 2016-09-15 NOTE — Progress Notes (Addendum)
Pre visit review using our clinic review tool, if applicable. No additional management support is needed unless otherwise documented below in the visit note.  Patient came in office for monthly B12 injection. IM injection was given in the left deltoid. Patient tolerated it well. She will call the office at a later time to schedule her next appointment. Kathlene November, MD

## 2016-10-02 DIAGNOSIS — F410 Panic disorder [episodic paroxysmal anxiety] without agoraphobia: Secondary | ICD-10-CM | POA: Diagnosis not present

## 2016-10-02 DIAGNOSIS — F411 Generalized anxiety disorder: Secondary | ICD-10-CM | POA: Diagnosis not present

## 2016-10-17 ENCOUNTER — Encounter: Payer: Self-pay | Admitting: Family

## 2016-10-19 ENCOUNTER — Ambulatory Visit (INDEPENDENT_AMBULATORY_CARE_PROVIDER_SITE_OTHER): Payer: BLUE CROSS/BLUE SHIELD | Admitting: Family Medicine

## 2016-10-19 ENCOUNTER — Encounter: Payer: Self-pay | Admitting: Family Medicine

## 2016-10-19 ENCOUNTER — Ambulatory Visit (HOSPITAL_BASED_OUTPATIENT_CLINIC_OR_DEPARTMENT_OTHER)
Admission: RE | Admit: 2016-10-19 | Discharge: 2016-10-19 | Disposition: A | Discharge status: Home / Self Care | Payer: BLUE CROSS/BLUE SHIELD | Source: Ambulatory Visit | Attending: Family Medicine | Admitting: Family Medicine

## 2016-10-19 VITALS — BP 118/82 | HR 81 | Temp 98.2°F | Ht 64.0 in | Wt 191.4 lb

## 2016-10-19 DIAGNOSIS — R1013 Epigastric pain: Secondary | ICD-10-CM | POA: Diagnosis not present

## 2016-10-19 DIAGNOSIS — K760 Fatty (change of) liver, not elsewhere classified: Secondary | ICD-10-CM | POA: Diagnosis not present

## 2016-10-19 LAB — COMPREHENSIVE METABOLIC PANEL
ALBUMIN: 4.1 g/dL (ref 3.5–5.2)
ALT: 42 U/L — AB (ref 0–35)
AST: 29 U/L (ref 0–37)
Alkaline Phosphatase: 63 U/L (ref 39–117)
BUN: 11 mg/dL (ref 6–23)
CALCIUM: 9 mg/dL (ref 8.4–10.5)
CO2: 29 meq/L (ref 19–32)
CREATININE: 0.81 mg/dL (ref 0.40–1.20)
Chloride: 105 mEq/L (ref 96–112)
GFR: 83.81 mL/min (ref >60.00)
Glucose, Bld: 91 mg/dL (ref 70–99)
Potassium: 3.9 mEq/L (ref 3.5–5.1)
Sodium: 138 mEq/L (ref 135–145)
Total Bilirubin: 0.6 mg/dL (ref 0.2–1.2)
Total Protein: 7.5 g/dL (ref 6.0–8.3)

## 2016-10-19 LAB — CBC
HCT: 41.3 % (ref 36.0–46.0)
Hemoglobin: 13.6 g/dL (ref 12.0–15.0)
MCHC: 33 g/dL (ref 30.0–36.0)
MCV: 88.7 fl (ref 78.0–100.0)
PLATELETS: 323 10*3/uL (ref 150.0–400.0)
RBC: 4.65 Mil/uL (ref 3.87–5.11)
RDW: 13.2 % (ref 11.5–15.5)
WBC: 7.1 10*3/uL (ref 4.0–10.5)

## 2016-10-19 NOTE — Patient Instructions (Addendum)
I think this is related to your reflux.   If you do not hear anything about your [place] in the next week, call our office and ask for an update.  Give [place] 2-3 business days to get the results of your labs back.   Let us know if you need anything.  j

## 2016-10-19 NOTE — Progress Notes (Signed)
Chief Complaint  Patient presents with  . Abdominal Pain    burning and pain in the upper stomach along with the acid reflux-sxs started on Sat    Jaclyn Tucker is here for epigastric and burning abdominal pain.  Duration: 5 days  Feels like her reflux. Nighttime awakenings? No Bleeding? No Weight loss? No Palliation: Sitting up Provocation: Sometimes eating (not specifically greasy/fatty foods) Associated symptoms: none Denies: fever, nausea, vomiting and bowel changes Treatment to date: None  ROS: Constitutional: No fevers GI: No N/V/D/C, no bleeding + pain  Past Medical History:  Diagnosis Date  . Anxiety   . Depression   . Fibromyalgia 06/2014  . GERD (gastroesophageal reflux disease)   . IBS (irritable bowel syndrome) 11/04/2014  . Migraine   . Vitamin B12 deficiency    Family History  Problem Relation Age of Onset  . Diabetes Mother   . Hypertension Father   . Colon cancer Paternal Grandmother   . Breast cancer Paternal Grandmother   . Crohn's disease Brother    Past Surgical History:  Procedure Laterality Date  . g2 p2      BP 118/82 (BP Location: Left Arm, Patient Position: Sitting, Cuff Size: Normal)   Pulse 81   Temp 98.2 F (36.8 C) (Oral)   Ht 5\' 4"  (1.626 m)   Wt 191 lb 6.4 oz (86.8 kg)   LMP 10/08/2016 (Approximate)   SpO2 99%   BMI 32.85 kg/m  Gen.: Awake, alert, appears stated age 41: Mucous membranes moist without mucosal lesions Heart: Regular rate and rhythm without murmurs Lungs: Clear auscultation bilaterally, no rales or wheezing, normal effort without accessory muscle use. Abdomen: Bowel sounds are present. Abdomen is soft, TTP in upper quadrants, worse over epigastric region, nondistended, no masses or organomegaly. Negative Murphy's, Rovsing's, McBurney's, and Carnett's sign. Psych: Age appropriate judgment and insight. Normal mood and affect.  Epigastric abdominal pain - Plan: US Abdomen Limited RUQ, CBC, Comprehensive  metabolic panel  Orders as above. Reassurance given this is likely not related to gall bladder, patient would like [place] and labs for reassurance and to appease husband. OK.  No red flag signs/symptoms. F/u prn otherwise. Pt voiced understanding and agreement to the plan.  Marlin, DO 10/19/16 7:49 AM

## 2016-10-20 ENCOUNTER — Encounter: Payer: Self-pay | Admitting: Family Medicine

## 2016-11-06 DIAGNOSIS — M9905 Segmental and somatic dysfunction of pelvic region: Secondary | ICD-10-CM | POA: Diagnosis not present

## 2016-11-06 DIAGNOSIS — M461 Sacroiliitis, not elsewhere classified: Secondary | ICD-10-CM | POA: Diagnosis not present

## 2016-11-06 DIAGNOSIS — M9903 Segmental and somatic dysfunction of lumbar region: Secondary | ICD-10-CM | POA: Diagnosis not present

## 2016-11-06 DIAGNOSIS — M5126 Other intervertebral disc displacement, lumbar region: Secondary | ICD-10-CM | POA: Diagnosis not present

## 2016-11-09 ENCOUNTER — Ambulatory Visit (INDEPENDENT_AMBULATORY_CARE_PROVIDER_SITE_OTHER): Payer: BLUE CROSS/BLUE SHIELD | Admitting: Behavioral Health

## 2016-11-09 DIAGNOSIS — E538 Deficiency of other specified B group vitamins: Secondary | ICD-10-CM | POA: Diagnosis not present

## 2016-11-09 MED ORDER — CYANOCOBALAMIN 1000 MCG/ML IJ SOLN
1000.0000 ug | Freq: Once | INTRAMUSCULAR | Status: AC
  Administered 2016-11-09: 1000 ug via INTRAMUSCULAR

## 2016-11-09 NOTE — Progress Notes (Addendum)
Pre visit review using our clinic review tool, if applicable. No additional management support is needed unless otherwise documented below in the visit note.  Patient came in office for monthly B12 injection. IM injection was given in the left deltoid. Patient tolerated it well. She will call the office at a later date to schedule the next appointment.  Agree with administration of B12 IM due to patient's B12 deficiency history.  Saguier, Percell Miller, PA-C

## 2016-11-28 IMAGING — CR DG CHEST 2V
2 series · 2 of 2 positions shown · non-contrast
Comparison: 07/07/2013

CLINICAL DATA: Left-sided chest pain beginning 3 days ago.

EXAM:
CHEST  2 VIEW

[w chest pa]
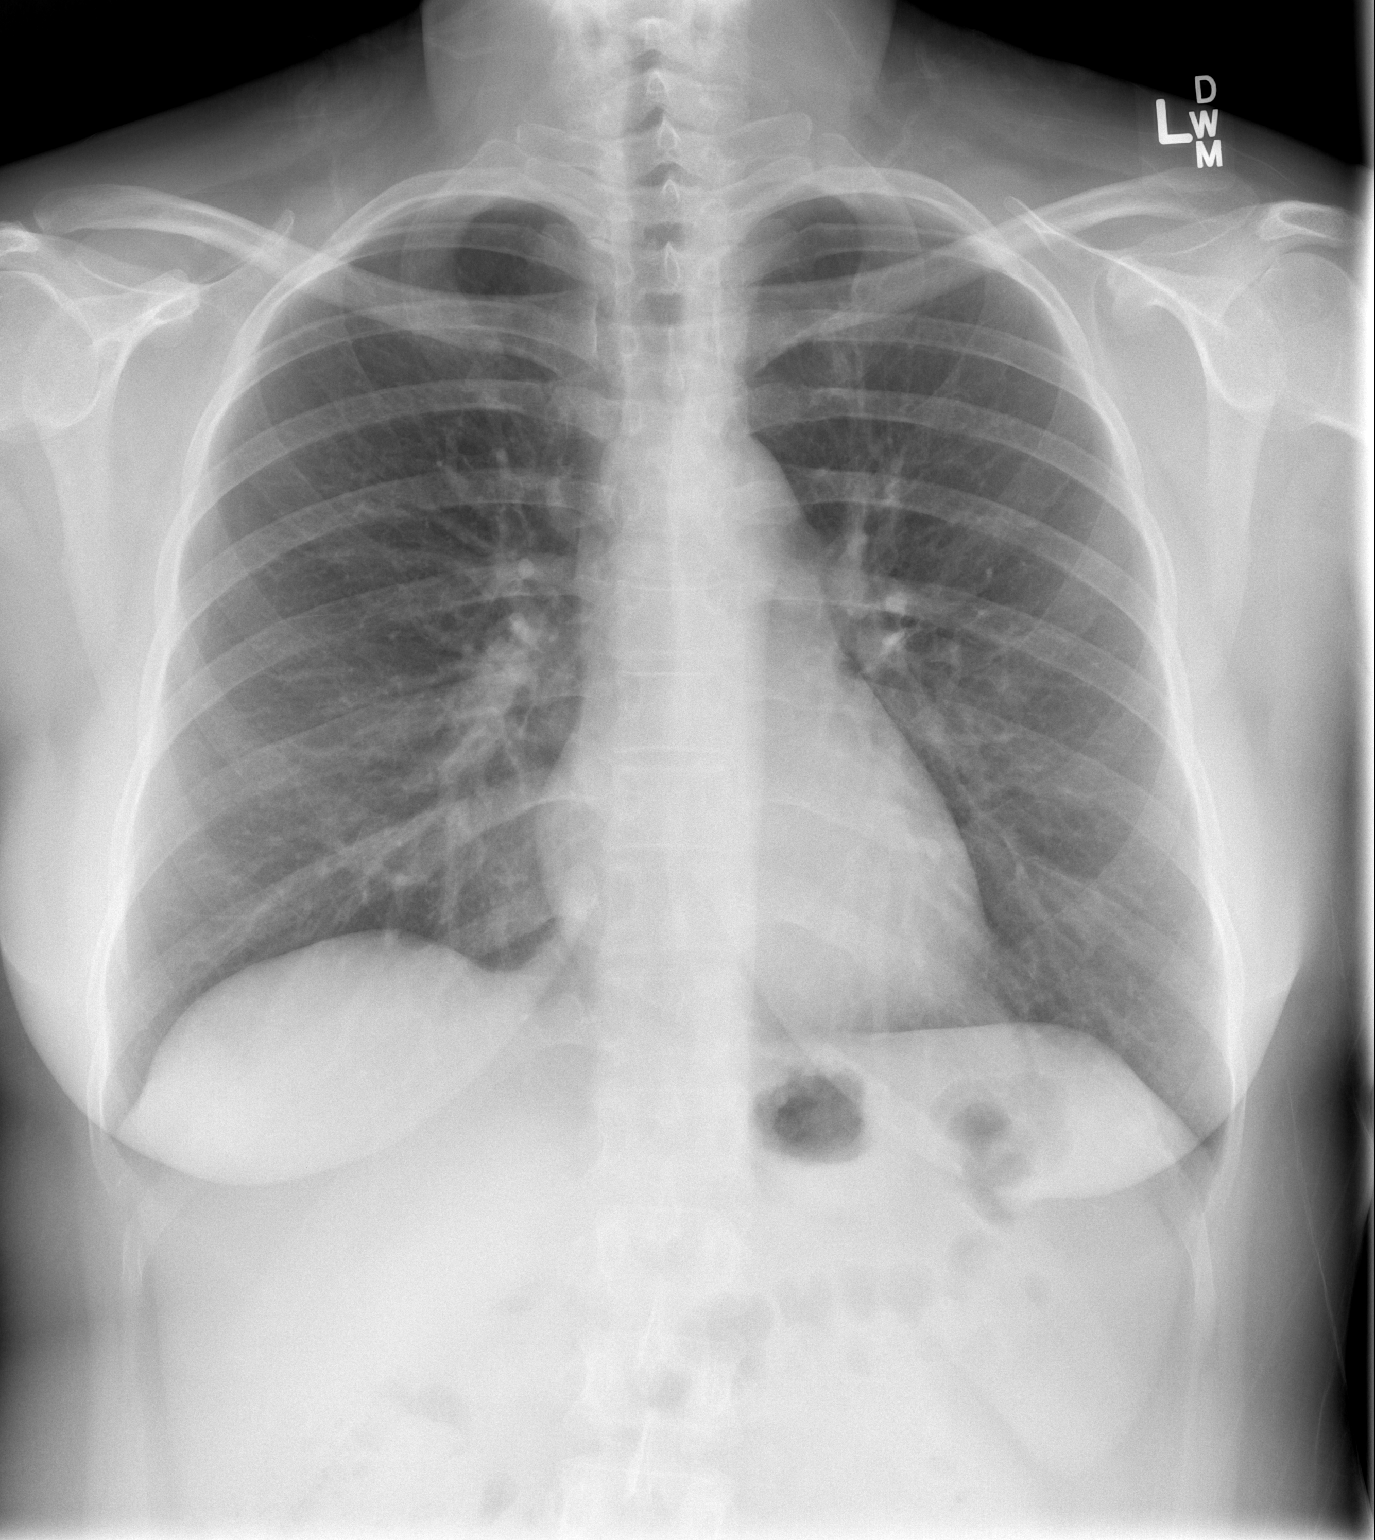

[w chest lat]
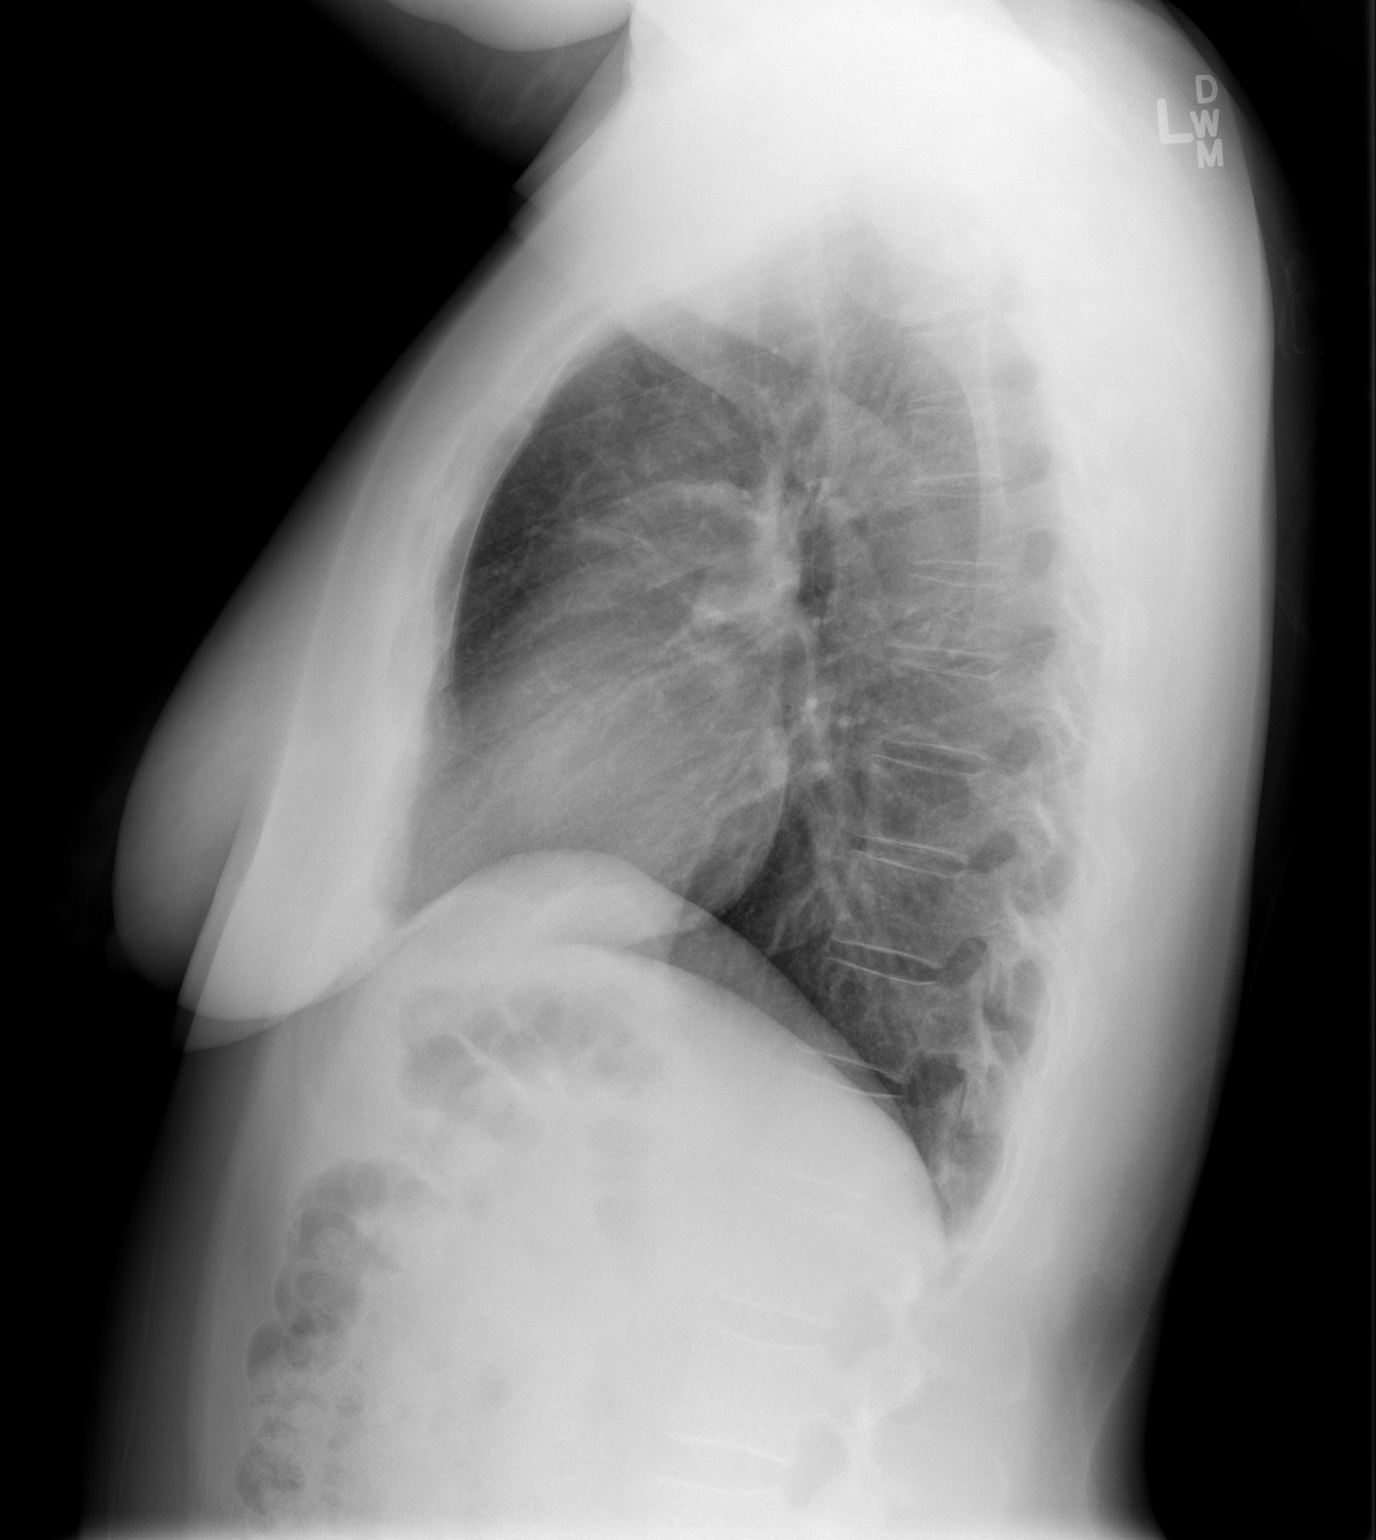

[2 of 2 positions shown; findings below may reference images not displayed]

FINDINGS: Heart size is normal. Mediastinal shadows are normal. The lungs are
clear. No bronchial thickening. No infiltrate, mass, effusion or
collapse. Pulmonary vascularity is normal. No bony abnormality.
IMPRESSION: Normal chest

## 2016-12-14 ENCOUNTER — Encounter: Payer: Self-pay | Admitting: Family

## 2016-12-14 NOTE — Telephone Encounter (Signed)
Husband can come to RN b12 visit for teaching and then we can send rx for b12 injection to her pharmacy.

## 2016-12-18 NOTE — Telephone Encounter (Signed)
Jaclyn Tucker / Jaclyn Tucker-- please see pt's request to do teaching at upcoming b12 injection on 12/22/16 and advise?

## 2016-12-22 ENCOUNTER — Ambulatory Visit (INDEPENDENT_AMBULATORY_CARE_PROVIDER_SITE_OTHER): Payer: BLUE CROSS/BLUE SHIELD | Admitting: Behavioral Health

## 2016-12-22 DIAGNOSIS — E538 Deficiency of other specified B group vitamins: Secondary | ICD-10-CM | POA: Diagnosis not present

## 2016-12-22 MED ORDER — CYANOCOBALAMIN 1000 MCG/ML IJ SOLN
1000.0000 ug | Freq: Once | INTRAMUSCULAR | Status: AC
  Administered 2016-12-22: 1000 ug via INTRAMUSCULAR

## 2016-12-22 NOTE — Progress Notes (Signed)
Pre visit review using our clinic review tool, if applicable. No additional management support is needed unless otherwise documented below in the visit note.  Patient came in office for monthly B12 injection. IM injection was given in the left deltoid. Patient tolerated it well. She will call the office at a later date to schedule her next appointment.

## 2017-01-23 DIAGNOSIS — F410 Panic disorder [episodic paroxysmal anxiety] without agoraphobia: Secondary | ICD-10-CM | POA: Diagnosis not present

## 2017-02-05 DIAGNOSIS — H6502 Acute serous otitis media, left ear: Secondary | ICD-10-CM | POA: Diagnosis not present

## 2017-02-05 DIAGNOSIS — J329 Chronic sinusitis, unspecified: Secondary | ICD-10-CM | POA: Diagnosis not present

## 2017-02-14 ENCOUNTER — Ambulatory Visit (INDEPENDENT_AMBULATORY_CARE_PROVIDER_SITE_OTHER): Payer: BLUE CROSS/BLUE SHIELD

## 2017-02-14 ENCOUNTER — Ambulatory Visit: Payer: Self-pay

## 2017-02-14 DIAGNOSIS — E538 Deficiency of other specified B group vitamins: Secondary | ICD-10-CM

## 2017-02-14 MED ORDER — CYANOCOBALAMIN 1000 MCG/ML IJ SOLN
1000.0000 ug | Freq: Once | INTRAMUSCULAR | Status: AC
  Administered 2017-02-14: 1000 ug via INTRAMUSCULAR

## 2017-02-14 MED ORDER — CYANOCOBALAMIN 1000 MCG/ML IJ SOLN: 1000.0000 ug | Freq: Once | INTRAMUSCULAR | 0 refills | Status: AC

## 2017-02-14 NOTE — Progress Notes (Signed)
Pre visit review using our clinic tool,if applicable. No additional management support is needed unless otherwise documented below in the visit note.  Patient in today for B12 injection per orders from M. O'sullivan due to patient having B12 deficiency.   Patient also brought husband for patient teaching regarding B12 injections for home administration of her injections.  Explained to patient and husband purpose of injections and instructions given to husband on preparation and administration of injections. Return demonstration given succesfully by husband using artificial arm.  Patient to start home injections on next injection due next month.

## 2017-03-19 ENCOUNTER — Encounter: Payer: Self-pay | Admitting: Family

## 2017-03-19 ENCOUNTER — Ambulatory Visit (INDEPENDENT_AMBULATORY_CARE_PROVIDER_SITE_OTHER): Payer: BLUE CROSS/BLUE SHIELD | Admitting: Family

## 2017-03-19 VITALS — BP 128/75 | HR 78 | Temp 98.5°F | Resp 18 | Ht 64.5 in | Wt 185.0 lb

## 2017-03-19 DIAGNOSIS — Z0000 Encounter for general adult medical examination without abnormal findings: Secondary | ICD-10-CM | POA: Diagnosis not present

## 2017-03-19 NOTE — Progress Notes (Signed)
Subjective:    Patient ID: Jaclyn Tucker, female    DOB: 08/04/1975, 42 y.o.   MRN: [id number]  HPI  Patient presents today for complete physical.   Immunizations: teetanus 2013, declines flu shot  Diet: improved, weight went up to 195  Wt Readings from Last 3 Encounters:  03/19/17 185 lb (83.9 kg)  10/19/16 191 lb 6.4 oz (86.8 kg)  07/10/16 187 lb 3.2 oz (84.9 kg)   Exercise: 3 times a week, treadmill, bike, some weights Pap Smear: 05/09/16   Review of Systems  Constitutional: Negative for unexpected weight change.  HENT: Negative for rhinorrhea.   Respiratory:       Mild AM cough, following recent URI  Cardiovascular: Negative for leg swelling.  Gastrointestinal: Negative for diarrhea.        some left sided abdominal tendernes since Thursday (had diarrhea/bloating that day) no fever, normal bm's since them. Attributed to IBS  Musculoskeletal: Positive for myalgias.  Skin: Negative for rash.  Neurological: Negative for headaches.  Hematological: Negative for adenopathy.  Psychiatric/Behavioral:       Denies depression/anxiety   Past Medical History:  Diagnosis Date  . Anxiety   . Depression   . Fibromyalgia 06/2014  . GERD (gastroesophageal reflux disease)   . IBS (irritable bowel syndrome) 11/04/2014  . Migraine   . Vitamin B12 deficiency      Social History   Socioeconomic History  . Marital status: Married    Spouse name: Not on file  . Number of children: 2  . Years of education: Not on file  . Highest education level: Not on file  Social Needs  . Financial resource strain: Not on file  . Food insecurity - worry: Not on file  . Food insecurity - inability: Not on file  . Transportation needs - medical: Not on file  . Transportation needs - non-medical: Not on file  Occupational History  . Occupation: customer service  Tobacco Use  . Smoking status: Former Smoker    Types: Cigarettes    Last attempt to quit: 04/13/2014    Years since quitting: 2.9   . Smokeless tobacco: Never Used  Substance and Sexual Activity  . Alcohol use: Yes    Alcohol/week: 0.0 oz    Comment: 1-2 per month  . Drug use: No  . Sexual activity: Yes    Birth control/protection: None  Other Topics Concern  . Not on file  Social History Narrative   Marries   2 sons 1999 and 2008   Customer relations Physiological scientist since 2006   No caffeine   11/04/2014    Past Surgical History:  Procedure Laterality Date  . g2 p2      Family History  Problem Relation Age of Onset  . Diabetes Mother   . Hypertension Father   . Colon cancer Paternal Grandmother   . Breast cancer Paternal Grandmother   . Crohn's disease Brother     Allergies  Allergen Reactions  . Bee Venom Swelling  . Penicillins     REACTION: swelling  . Quinolones Anaphylaxis and Palpitations    REACTION: nausea  . Sulfate Rash  . Celexa [Citalopram Hydrobromide] Other (See Comments)    Panic attacks  . Duloxetine Other (See Comments)    Panic attacks  . Escitalopram Other (See Comments)    Panic attacks  . Flexeril [Cyclobenzaprine] Swelling  . Moxifloxacin     REACTION: nausea  . Ranitidine Hcl     Other reaction(s): Other (See  Comments) uknown  . Sumatriptan Succinate     REACTION: nausea, dizziness, panic symptoms    Current Outpatient Medications on File Prior to Visit  Medication Sig Dispense Refill  . ALPRAZolam (XANAX) 0.5 MG tablet Take 0.5 mg by mouth daily as needed.     Marland Kitchen aspirin-acetaminophen-caffeine (EXCEDRIN MIGRAINE) 250-250-65 MG per tablet Take 1 tablet by mouth every 6 (six) hours as needed for headache.    . Azelastine-Fluticasone 137-50 MCG/ACT SUSP Place 1 spray into the nose 2 (two) times daily. 23 g 5  . EPINEPHrine 0.3 mg/0.3 mL IJ SOAJ injection As needed.    . gabapentin (NEURONTIN) 100 MG capsule Take 1 capsule (100 mg total) by mouth 3 (three) times daily. 90 capsule 5  . methocarbamol (ROBAXIN) 750 MG tablet Take 1 tablet (750 mg total) by mouth 3  (three) times daily. (Patient taking differently: Take 750 mg by mouth 3 (three) times daily as needed. ) 45 tablet 0  . pantoprazole (PROTONIX) 40 MG tablet Take 1 tablet (40 mg total) by mouth 2 (two) times daily before a meal. (Patient taking differently: Take 40 mg by mouth 2 (two) times daily as needed. ) 60 tablet 3  . sertraline (ZOLOFT) 50 MG tablet Take 1 tablet by mouth daily.    . valACYclovir (VALTREX) 1000 MG tablet As needed.     No current facility-administered medications on file prior to visit.     BP 128/75 (BP Location: Left Arm, Cuff Size: Large)   Pulse 78   Temp 98.5 F (36.9 C) (Oral)   Resp 18   Ht 5' 4.5" (1.638 m)   Wt 185 lb (83.9 kg)   LMP 02/23/2017   SpO2 99%   BMI 31.26 kg/m       Objective:   Physical Exam  Physical Exam  Constitutional: She is oriented to person, place, and time. She appears well-developed and well-nourished. No distress.  HENT:  Head: Normocephalic and atraumatic.  Right Ear: Tympanic membrane and ear canal normal.  Left Ear: Tympanic membrane and ear canal normal.  Mouth/Throat: Oropharynx is clear and moist.  Eyes: Pupils are equal, round, and reactive to light. No scleral icterus.  Neck: Normal range of motion. No thyromegaly present.  Cardiovascular: Normal rate and regular rhythm.   No murmur heard. Pulmonary/Chest: Effort normal and breath sounds normal. No respiratory distress. He has no wheezes. She has no rales. She exhibits no tenderness.  Abdominal: Soft. Bowel sounds are normal. She exhibits no distension and no mass. There is no tenderness. There is no rebound and no guarding.  Musculoskeletal: She exhibits no edema.  Lymphadenopathy:    She has no cervical adenopathy.  Neurological: She is alert and oriented to person, place, and time. She has normal patellar reflexes. She exhibits normal muscle tone. Coordination normal.  Skin: Skin is warm and dry.  Psychiatric: She has a normal mood and affect. Her behavior  is normal. Judgment and thought content normal.  Breasts: Examined lying Right: Without masses, retractions, discharge or axillary adenopathy.  Left: Without masses, retractions, discharge or axillary adenopathy.  Pelvic: deferred           Assessment & Plan:         Assessment & Plan:  Preventative care- discussed healthy diet, exercise weight loss. Obtain routine lab work. Abdomen is not tender on exam, suspect she had a mild gastroenteritis which has resolved.

## 2017-03-19 NOTE — Patient Instructions (Signed)
Preventive Care 18-39 Years, Female Preventive care refers to lifestyle choices and visits with your health care provider that can promote health and wellness. What does preventive care include?  A yearly physical exam. This is also called an annual well check.  Dental exams once or twice a year.  Routine eye exams. Ask your health care provider how often you should have your eyes checked.  Personal lifestyle choices, including: ? Daily care of your teeth and gums. ? Regular physical activity. ? Eating a healthy diet. ? Avoiding tobacco and drug use. ? Limiting alcohol use. ? Practicing safe sex. ? Taking vitamin and mineral supplements as recommended by your health care provider. What happens during an annual well check? The services and screenings done by your health care provider during your annual well check will depend on your age, overall health, lifestyle risk factors, and family history of disease. Counseling Your health care provider may ask you questions about your:  Alcohol use.  Tobacco use.  Drug use.  Emotional well-being.  Home and relationship well-being.  Sexual activity.  Eating habits.  Work and work Statistician.  Method of birth control.  Menstrual cycle.  Pregnancy history.  Screening You may have the following tests or measurements:  Height, weight, and BMI.  Diabetes screening. This is done by checking your blood sugar (glucose) after you have not eaten for a while (fasting).  Blood pressure.  Lipid and cholesterol levels. These may be checked every 5 years starting at age 69.  Skin check.  Hepatitis C blood test.  Hepatitis B blood test.  Sexually transmitted disease (STD) testing.  BRCA-related cancer screening. This may be done if you have a family history of breast, ovarian, tubal, or peritoneal cancers.  Pelvic exam and Pap test. This may be done every 3 years starting at age 58. Starting at age 61, this may be done every 5  years if you have a Pap test in combination with an HPV test.  Discuss your test results, treatment options, and if necessary, the need for more tests with your health care provider. Vaccines Your health care provider may recommend certain vaccines, such as:  Influenza vaccine. This is recommended every year.  Tetanus, diphtheria, and acellular pertussis (Tdap, Td) vaccine. You may need a Td booster every 10 years.  Varicella vaccine. You may need this if you have not been vaccinated.  HPV vaccine. If you are 1 or younger, you may need three doses over 6 months.  Measles, mumps, and rubella (MMR) vaccine. You may need at least one dose of MMR. You may also need a second dose.  Pneumococcal 13-valent conjugate (PCV13) vaccine. You may need this if you have certain conditions and were not previously vaccinated.  Pneumococcal polysaccharide (PPSV23) vaccine. You may need one or two doses if you smoke cigarettes or if you have certain conditions.  Meningococcal vaccine. One dose is recommended if you are age 2-21 years and a first-year college student living in a residence hall, or if you have one of several medical conditions. You may also need additional booster doses.  Hepatitis A vaccine. You may need this if you have certain conditions or if you travel or work in places where you may be exposed to hepatitis A.  Hepatitis B vaccine. You may need this if you have certain conditions or if you travel or work in places where you may be exposed to hepatitis B.  Haemophilus influenzae type b (Hib) vaccine. You may need this if  you have certain risk factors.  Talk to your health care provider about which screenings and vaccines you need and how often you need them. This information is not intended to replace advice given to you by your health care provider. Make sure you discuss any questions you have with your health care provider. Document Released: 04/25/2001 Document Revised: 11/17/2015  Document Reviewed: 12/29/2014 Elsevier Interactive Patient Education  [employer].

## 2017-03-20 ENCOUNTER — Telehealth: Payer: Self-pay | Admitting: Family

## 2017-03-20 LAB — LIPID PANEL
CHOLESTEROL: 197 mg/dL (ref 0–200)
HDL: 42.8 mg/dL (ref >39.00)
LDL Cholesterol: 119 mg/dL — ABNORMAL HIGH (ref 0–99)
NonHDL: 154.37
Total CHOL/HDL Ratio: 5
Triglycerides: 176 mg/dL — ABNORMAL HIGH (ref 0.0–149.0)
VLDL: 35.2 mg/dL (ref 0.0–40.0)

## 2017-03-20 LAB — URINALYSIS, ROUTINE W REFLEX MICROSCOPIC
BILIRUBIN URINE: NEGATIVE
Hgb urine dipstick: NEGATIVE
KETONES UR: NEGATIVE
LEUKOCYTES UA: NEGATIVE
Nitrite: NEGATIVE
PH: 6 (ref 5.0–8.0)
RBC / HPF: NONE SEEN (ref 0–?)
Specific Gravity, Urine: 1.01 (ref 1.000–1.030)
Total Protein, Urine: NEGATIVE
Urine Glucose: NEGATIVE
Urobilinogen, UA: 0.2 (ref 0.0–1.0)
WBC, UA: NONE SEEN (ref 0–?)

## 2017-03-20 LAB — COMPREHENSIVE METABOLIC PANEL
ALK PHOS: 65 U/L (ref 39–117)
ALT: 19 U/L (ref 0–35)
AST: 17 U/L (ref 0–37)
Albumin: 4.3 g/dL (ref 3.5–5.2)
BUN: 14 mg/dL (ref 6–23)
CO2: 29 meq/L (ref 19–32)
Calcium: 9.5 mg/dL (ref 8.4–10.5)
Chloride: 105 mEq/L (ref 96–112)
Creatinine, Ser: 0.78 mg/dL (ref 0.40–1.20)
GFR: 87.35 mL/min (ref >60.00)
GLUCOSE: 99 mg/dL (ref 70–99)
POTASSIUM: 4.2 meq/L (ref 3.5–5.1)
Sodium: 144 mEq/L (ref 135–145)
Total Bilirubin: 0.5 mg/dL (ref 0.2–1.2)
Total Protein: 7.7 g/dL (ref 6.0–8.3)

## 2017-03-20 LAB — CBC WITH DIFFERENTIAL/PLATELET
Basophils Absolute: 0.1 10*3/uL (ref 0.0–0.1)
Basophils Relative: 1.3 % (ref 0.0–3.0)
EOS PCT: 1.6 % (ref 0.0–5.0)
Eosinophils Absolute: 0.1 10*3/uL (ref 0.0–0.7)
HCT: 42.2 % (ref 36.0–46.0)
Hemoglobin: 13.8 g/dL (ref 12.0–15.0)
LYMPHS ABS: 2.9 10*3/uL (ref 0.7–4.0)
LYMPHS PCT: 33 % (ref 12.0–46.0)
MCHC: 32.7 g/dL (ref 30.0–36.0)
MCV: 90 fl (ref 78.0–100.0)
MONOS PCT: 7.4 % (ref 3.0–12.0)
Monocytes Absolute: 0.7 10*3/uL (ref 0.1–1.0)
NEUTROS ABS: 5 10*3/uL (ref 1.4–7.7)
NEUTROS PCT: 56.7 % (ref 43.0–77.0)
Platelets: 328 10*3/uL (ref 150.0–400.0)
RBC: 4.69 Mil/uL (ref 3.87–5.11)
RDW: 13.7 % (ref 11.5–15.5)
WBC: 8.8 10*3/uL (ref 4.0–10.5)

## 2017-03-20 LAB — TSH: TSH: 2.14 u[IU]/mL (ref 0.35–4.50)

## 2017-03-20 MED ORDER — "SYRINGE 20G X 1-1/2"" 3 ML MISC": 0 refills | Status: DC

## 2017-03-20 MED ORDER — CYANOCOBALAMIN 1000 MCG/ML IJ SOLN: 1000.0000 ug | INTRAMUSCULAR | 4 refills | Status: DC

## 2017-03-20 NOTE — Telephone Encounter (Signed)
Requested syringes, b12.

## 2017-03-21 ENCOUNTER — Encounter: Payer: Self-pay | Admitting: Family

## 2017-03-28 ENCOUNTER — Encounter: Payer: Self-pay | Admitting: Family

## 2017-04-04 DIAGNOSIS — D485 Neoplasm of uncertain behavior of skin: Secondary | ICD-10-CM | POA: Diagnosis not present

## 2017-04-04 DIAGNOSIS — D2262 Melanocytic nevi of left upper limb, including shoulder: Secondary | ICD-10-CM | POA: Diagnosis not present

## 2017-04-04 DIAGNOSIS — D225 Melanocytic nevi of trunk: Secondary | ICD-10-CM | POA: Diagnosis not present

## 2017-04-16 ENCOUNTER — Other Ambulatory Visit: Payer: Self-pay | Admitting: Family

## 2017-04-24 DIAGNOSIS — F410 Panic disorder [episodic paroxysmal anxiety] without agoraphobia: Secondary | ICD-10-CM | POA: Diagnosis not present

## 2017-04-24 DIAGNOSIS — F411 Generalized anxiety disorder: Secondary | ICD-10-CM | POA: Diagnosis not present

## 2017-05-04 ENCOUNTER — Encounter: Payer: Self-pay | Admitting: Family

## 2017-05-04 MED ORDER — PANTOPRAZOLE SODIUM 40 MG PO TBEC: 40.0000 mg | DELAYED_RELEASE_TABLET | Freq: Every day | ORAL | 3 refills | Status: DC

## 2017-06-19 DIAGNOSIS — G43101 Migraine with aura, not intractable, with status migrainosus: Secondary | ICD-10-CM | POA: Diagnosis not present

## 2017-06-19 DIAGNOSIS — H5712 Ocular pain, left eye: Secondary | ICD-10-CM | POA: Diagnosis not present

## 2017-06-20 ENCOUNTER — Encounter: Payer: Self-pay | Admitting: Family

## 2017-06-20 ENCOUNTER — Ambulatory Visit: Payer: BLUE CROSS/BLUE SHIELD | Admitting: Internal Medicine

## 2017-06-20 ENCOUNTER — Ambulatory Visit: Payer: BLUE CROSS/BLUE SHIELD | Admitting: Family

## 2017-06-20 VITALS — BP 119/99 | HR 80 | Temp 98.7°F | Resp 16 | Ht 64.0 in | Wt 182.4 lb

## 2017-06-20 DIAGNOSIS — G43109 Migraine with aura, not intractable, without status migrainosus: Secondary | ICD-10-CM | POA: Diagnosis not present

## 2017-06-20 MED ORDER — KETOROLAC TROMETHAMINE 60 MG/2ML IM SOLN
60.0000 mg | Freq: Once | INTRAMUSCULAR | Status: AC
  Administered 2017-06-20: 60 mg via INTRAMUSCULAR

## 2017-06-20 NOTE — Progress Notes (Signed)
Subjective:    Patient ID: Jaclyn Tucker, female    DOB: [birth year], 42 y.o.   MRN: [id number]  HPI  Jaclyn Tucker is a 42 yr old female who presents today with chief complaint of headache. She reports that HA began on 06/28/17.  Pain is intense and is located behind the left eye and radiates to the back of her head. Symptoms started with wavy visual changes.  Reports vision "just feels off."  She went ot see the eye doctor yesterday and was told that her eyes look OK  "feels like someone is squeezing my eyeball." Mild photophobia.  She has tried ice, gabapentin, rest without improvement.  Reports current HA pain is 7.5/10.  She has previous hx of Migraines.   Review of Systems See HPI  Past Medical History:  Diagnosis Date  . Anxiety   . Depression   . Fibromyalgia 06/2014  . GERD (gastroesophageal reflux disease)   . IBS (irritable bowel syndrome) 11/04/2014  . Migraine   . Vitamin B12 deficiency      Social History   Socioeconomic History  . Marital status: Married    Spouse name: Not on file  . Number of children: 2  . Years of education: Not on file  . Highest education level: Not on file  Occupational History  . Occupation: Therapist, art  Social Needs  . Financial resource strain: Not on file  . Food insecurity:    Worry: Not on file    Inability: Not on file  . Transportation needs:    Medical: Not on file    Non-medical: Not on file  Tobacco Use  . Smoking status: Former Smoker    Types: Cigarettes    Last attempt to quit: 04/13/2014    Years since quitting: 3.1  . Smokeless tobacco: Never Used  Substance and Sexual Activity  . Alcohol use: Yes    Alcohol/week: 0.0 oz    Comment: 1-2 per month  . Drug use: No  . Sexual activity: Yes    Birth control/protection: None  Lifestyle  . Physical activity:    Days per week: Not on file    Minutes per session: Not on file  . Stress: Not on file  Relationships  . Social connections:    Talks on phone: Not on  file    Gets together: Not on file    Attends religious service: Not on file    Active member of club or organization: Not on file    Attends meetings of clubs or organizations: Not on file    Relationship status: Not on file  . Intimate partner violence:    Fear of current or ex partner: Not on file    Emotionally abused: Not on file    Physically abused: Not on file    Forced sexual activity: Not on file  Other Topics Concern  . Not on file  Social History Narrative   Married   2 sons 1999 and 2008   Customer relations Physiological scientist since 2006   No caffeine   11/04/2014    Past Surgical History:  Procedure Laterality Date  . g2 p2      Family History  Problem Relation Age of Onset  . Diabetes Mother   . Hypertension Father   . Colon cancer Paternal Grandmother   . Breast cancer Paternal Grandmother   . Crohn's disease Brother     Allergies  Allergen Reactions  . Bee Venom Swelling  . Penicillins  REACTION: swelling  . Quinolones Anaphylaxis and Palpitations    REACTION: nausea  . Sulfate Rash  . Celexa [Citalopram Hydrobromide] Other (See Comments)    Panic attacks  . Duloxetine Other (See Comments)    Panic attacks  . Escitalopram Other (See Comments)    Panic attacks  . Flexeril [Cyclobenzaprine] Swelling  . Moxifloxacin     REACTION: nausea  . Ranitidine Hcl     Other reaction(s): Other (See Comments) uknown  . Sumatriptan Succinate     REACTION: nausea, dizziness, panic symptoms    Current Outpatient Medications on File Prior to Visit  Medication Sig Dispense Refill  . ALPRAZolam (XANAX) 0.5 MG tablet Take 0.5 mg by mouth daily as needed.     Marland Kitchen aspirin-acetaminophen-caffeine (EXCEDRIN MIGRAINE) 250-250-65 MG per tablet Take 1 tablet by mouth every 6 (six) hours as needed for headache.    . Azelastine-Fluticasone 137-50 MCG/ACT SUSP Place 1 spray into the nose 2 (two) times daily. 23 g 5  . cyanocobalamin (,VITAMIN B-12,) 1000 MCG/ML injection  Inject 1 mL (1,000 mcg total) into the muscle every 30 (thirty) days. 3 mL 4  . EPINEPHrine 0.3 mg/0.3 mL IJ SOAJ injection As needed.    . gabapentin (NEURONTIN) 100 MG capsule TAKE 1 CAPSULE BY MOUTH THREE TIMES DAILY 90 capsule 2  . methocarbamol (ROBAXIN) 750 MG tablet Take 1 tablet (750 mg total) by mouth 3 (three) times daily. (Patient taking differently: Take 750 mg by mouth 3 (three) times daily as needed. ) 45 tablet 0  . pantoprazole (PROTONIX) 40 MG tablet Take 1 tablet (40 mg total) by mouth daily. 30 tablet 3  . sertraline (ZOLOFT) 50 MG tablet Take 1 tablet by mouth daily.    . Syringe/Needle, Disp, (SYRINGE 3CC/20GX1-1/2") 20G X 1-1/2" 3 ML MISC Use to inject b12 12 each 0  . valACYclovir (VALTREX) 1000 MG tablet As needed.     No current facility-administered medications on file prior to visit.     BP (!) 119/99 (BP Location: Left Arm, Patient Position: Sitting, Cuff Size: Small)   Pulse 80   Temp 98.7 F (37.1 C) (Oral)   Resp 16   Ht 5\' 4"  (1.626 m)   Wt 182 lb 6.4 oz (82.7 kg)   SpO2 100%   BMI 31.31 kg/m       Objective:   Physical Exam  Constitutional: She is oriented to person, place, and time. She appears well-developed and well-nourished.  HENT:  Head: Normocephalic and atraumatic.  Eyes: Pupils are equal, round, and reactive to light. Conjunctivae and EOM are normal. Right eye exhibits no discharge. Left eye exhibits no discharge.  Cardiovascular: Normal rate, regular rhythm and normal heart sounds.  No murmur heard. Pulmonary/Chest: Effort normal and breath sounds normal. No respiratory distress. She has no wheezes.  Neurological: She is alert and oriented to person, place, and time. No cranial nerve deficit. She exhibits normal muscle tone.  Reflex Scores:      Patellar reflexes are 2+ on the right side and 2+ on the left side. Skin: Skin is warm and dry.  Psychiatric: She has a normal mood and affect. Her behavior is normal. Judgment and thought  content normal.          Assessment & Plan:  Migraine- she is intolerant to triptans. Will given IM Toradol. She is advised to call if new/worsening symptoms or if symptoms fail to improve.

## 2017-06-20 NOTE — Patient Instructions (Signed)
Please call if new/worsening symptoms or if your headache does not improve.   Migraine Headache A migraine headache is an intense, throbbing pain on one side or both sides of the head. Migraines may also cause other symptoms, such as nausea, vomiting, and sensitivity to light and noise. What are the causes? Doing or taking certain things may also trigger migraines, such as:  Alcohol.  Smoking.  Medicines, such as: ? Medicine used to treat chest pain (nitroglycerine). ? Birth control pills. ? Estrogen pills. ? Certain blood pressure medicines.  Aged cheeses, chocolate, or caffeine.  Foods or drinks that contain nitrates, glutamate, aspartame, or tyramine.  Physical activity.  Other things that may trigger a migraine include:  Menstruation.  Pregnancy.  Hunger.  Stress, lack of sleep, too much sleep, or fatigue.  Weather changes.  What increases the risk? The following factors may make you more likely to experience migraine headaches:  Age. Risk increases with age.  Family history of migraine headaches.  Being Caucasian.  Depression and anxiety.  Obesity.  Being a woman.  Having a hole in the heart (patent foramen ovale) or other heart problems.  What are the signs or symptoms? The main symptom of this condition is pulsating or throbbing pain. Pain may:  Happen in any area of the head, such as on one side or both sides.  Interfere with daily activities.  Get worse with physical activity.  Get worse with exposure to bright lights or loud noises.  Other symptoms may include:  Nausea.  Vomiting.  Dizziness.  General sensitivity to bright lights, loud noises, or smells.  Before you get a migraine, you may get warning signs that a migraine is developing (aura). An aura may include:  Seeing flashing lights or having blind spots.  Seeing bright spots, halos, or zigzag lines.  Having tunnel vision or blurred vision.  Having numbness or a  tingling feeling.  Having trouble talking.  Having muscle weakness.  How is this diagnosed? A migraine headache can be diagnosed based on:  Your symptoms.  A physical exam.  Tests, such as CT scan or MRI of the head. These imaging tests can help rule out other causes of headaches.  Taking fluid from the spine (lumbar puncture) and analyzing it (cerebrospinal fluid analysis, or CSF analysis).  How is this treated? A migraine headache is usually treated with medicines that:  Relieve pain.  Relieve nausea.  Prevent migraines from coming back.  Treatment may also include:  Acupuncture.  Lifestyle changes like avoiding foods that trigger migraines.  Follow these instructions at home: Medicines  Take over-the-counter and prescription medicines only as told by your health care provider.  Do not drive or use heavy machinery while taking prescription pain medicine.  To prevent or treat constipation while you are taking prescription pain medicine, your health care provider may recommend that you: ? Drink enough fluid to keep your urine clear or pale yellow. ? Take over-the-counter or prescription medicines. ? Eat foods that are high in fiber, such as fresh fruits and vegetables, whole grains, and beans. ? Limit foods that are high in fat and processed sugars, such as fried and sweet foods. Lifestyle  Avoid alcohol use.  Do not use any products that contain nicotine or tobacco, such as cigarettes and e-cigarettes. If you need help quitting, ask your health care provider.  Get at least 8 hours of sleep every night.  Limit your stress. General instructions   Keep a journal to find out what  may trigger your migraine headaches. For example, write down: ? What you eat and drink. ? How much sleep you get. ? Any change to your diet or medicines.  If you have a migraine: ? Avoid things that make your symptoms worse, such as bright lights. ? It may help to lie down in a  dark, quiet room. ? Do not drive or use heavy machinery. ? Ask your health care provider what activities are safe for you while you are experiencing symptoms.  Keep all follow-up visits as told by your health care provider. This is important. Contact a health care provider if:  You develop symptoms that are different or more severe than your usual migraine symptoms. Get help right away if:  Your migraine becomes severe.  You have a fever.  You have a stiff neck.  You have vision loss.  Your muscles feel weak or like you cannot control them.  You start to lose your balance often.  You develop trouble walking.  You faint. This information is not intended to replace advice given to you by your health care provider. Make sure you discuss any questions you have with your health care provider. Document Released: 02/27/2005 Document Revised: 09/17/2015 Document Reviewed: 08/16/2015 Elsevier Interactive Patient Education  2017 [employer].

## 2017-06-22 ENCOUNTER — Encounter: Payer: Self-pay | Admitting: Family

## 2017-06-22 ENCOUNTER — Other Ambulatory Visit: Payer: Self-pay | Admitting: Family

## 2017-06-22 DIAGNOSIS — G43911 Migraine, unspecified, intractable, with status migrainosus: Secondary | ICD-10-CM

## 2017-06-22 MED ORDER — NAPROXEN 375 MG PO TBEC: DELAYED_RELEASE_TABLET | ORAL | 0 refills | Status: DC

## 2017-06-26 ENCOUNTER — Encounter: Payer: Self-pay | Admitting: Neurology

## 2017-06-26 ENCOUNTER — Ambulatory Visit: Payer: BLUE CROSS/BLUE SHIELD | Admitting: Neurology

## 2017-06-26 VITALS — BP 122/82 | HR 86 | Ht 64.0 in | Wt 182.0 lb

## 2017-06-26 DIAGNOSIS — G43019 Migraine without aura, intractable, without status migrainosus: Secondary | ICD-10-CM

## 2017-06-26 MED ORDER — PREDNISONE 10 MG PO TABS: ORAL_TABLET | ORAL | 0 refills | Status: DC

## 2017-06-26 NOTE — Progress Notes (Signed)
NEUROLOGY FOLLOW UP OFFICE NOTE  Jaclyn Tucker [id number]  HISTORY OF PRESENT ILLNESS: Jaclyn Tucker is a 42 year old right-handed female with anxiety, depression, fibromyalgia, IBS, GERD and B12 deficiency whom I previously saw for headaches, cognitive changes and speech difficulty presents again for migraines.    In March-April 2016, she developed neck pain into the shoulders, as well as headache from the back of her head, radiating to either side of her head.  It was a pounding and stabbing pain.  It was associated with osmophobia, photophobia and phonophobia.  Occasionally she noted nausea but no visual disturbance.  It was constant and fluctuated in intensity, severe about every 6 days.  She treated it with ibuprofen and Excedrin Migraine.  Headaches subsequently resolved.  Last month, she developed the headache again just prior to Christmas, and it was constant until about 2 weeks ago, when it spontaneously resolved.  Since then, she reports that her limbs feel heavy but no actual weakness.  She reports myalgias.  She reports muscle twitches involving the leg, thigh, calf and hands.  She denies double vision, dysphagia or difficulty breathing.  She reports numbness and tingling in her hands and arms if she is sitting with elbows on the armrests, which she has to shake out.  I first saw her in January 2017.    Onset:  One week ago, she had a dental procedure done.  Afterward, she developed a persistent headache.  It is similar to the previous headache that started in 2016 and occurred until 2017. Location:  Left sided, from retro-orbital radiating to back of head and down left side of neck into shoulder. Quality:  Non-throbbing Intensity:  Moderate to severe.  She denies new headache, thunderclap headache or severe headache that wakes from sleep. Aura:  Black spots in her vision Prodrome:  no Postdrome:  no Associated symptoms:  Phonophobia, some nausea and photophobia.  She denies  associated unilateral numbness or weakness. Duration:  Headache is constant Frequency:  Constant since last week Triggers/exacerbating factors:  Sitting up from supine position aggravates it but laying back down does not relieve it. Relieving factors:  No Activity:  aggravates  Current NSAIDS:  Naproxen 375mg , ibuprofen Current analgesics:  Excedrin Migraine Current triptans:  no Current anti-emetic:  no Current muscle relaxants:  methocarbamol 750mg  Current anti-anxiolytic:  Xanax as needed Current sleep aide:  no Current Antihypertensive medications:  no Current Antidepressant medications:  sertraline 50mg  Current Anticonvulsant medications:  gabapentin 100mg  usually at bedtime but may take up to 3 times daily. Current Vitamins/Herbal/Supplements:  no Current Antihistamines/Decongestants:  no Other therapy:  no  Past NSAIDS:  Celebrex, meloxicam Past analgesics:  tramadol Past abortive triptans:  no Past muscle relaxants:  baclofen, Flexeril 10mg  Past anti-emetic:  Zofran ODT 4mg  Past antihypertensive medications:  no Past antidepressant medications:  citalopram 10mg , Cymbalta 20mg  Past anticonvulsant medications:  no Past vitamins/Herbal/Supplements:  magnesium  Past antihistamines/decongestants:  no Other past therapies:  no  Caffeine:  Coffee in AM Alcohol:  occasional Smoker:  no Diet:  hydrates Depression:  no; Anxiety:  yes Other pain:  fibromyalgia Sleep hygiene:  good  MRI of brain without contrast from 08/28/15 was normal.  03/19/17 LABS:  CBC with WBC 8.8, HGB 13.8, HCT 42.2, PLT 328; CMP with Na 144, K 4.2, Cl 105, Co2 29, glucose 99, BUN 14, Cr 0.78, t bili 0.5, ALP 65, AST 17 and ALT 19.  PAST MEDICAL HISTORY: Past Medical History:  Diagnosis Date  .  Anxiety   . Depression   . Fibromyalgia 06/2014  . GERD (gastroesophageal reflux disease)   . IBS (irritable bowel syndrome) 11/04/2014  . Migraine   . Vitamin B12 deficiency     MEDICATIONS: Current  Outpatient Medications on File Prior to Visit  Medication Sig Dispense Refill  . ALPRAZolam (XANAX) 0.5 MG tablet Take 0.5 mg by mouth daily as needed.     Marland Kitchen aspirin-acetaminophen-caffeine (EXCEDRIN MIGRAINE) 250-250-65 MG per tablet Take 1 tablet by mouth every 6 (six) hours as needed for headache.    . Azelastine-Fluticasone 137-50 MCG/ACT SUSP Place 1 spray into the nose 2 (two) times daily. 23 g 5  . cyanocobalamin (,VITAMIN B-12,) 1000 MCG/ML injection Inject 1 mL (1,000 mcg total) into the muscle every 30 (thirty) days. 3 mL 4  . EPINEPHrine 0.3 mg/0.3 mL IJ SOAJ injection As needed.    . gabapentin (NEURONTIN) 100 MG capsule TAKE 1 CAPSULE BY MOUTH THREE TIMES DAILY 90 capsule 2  . methocarbamol (ROBAXIN) 750 MG tablet Take 1 tablet (750 mg total) by mouth 3 (three) times daily. (Patient taking differently: Take 750 mg by mouth 3 (three) times daily as needed. ) 45 tablet 0  . Naproxen 375 MG TBEC Take 2 tabs by mouth now, then one tab every 12 hours as needed for migraine (Patient not taking: Reported on 06/26/2017) 20 each 0  . pantoprazole (PROTONIX) 40 MG tablet Take 1 tablet (40 mg total) by mouth daily. 30 tablet 3  . sertraline (ZOLOFT) 50 MG tablet Take 1 tablet by mouth daily.    . Syringe/Needle, Disp, (SYRINGE 3CC/20GX1-1/2") 20G X 1-1/2" 3 ML MISC Use to inject b12 12 each 0  . valACYclovir (VALTREX) 1000 MG tablet As needed.     No current facility-administered medications on file prior to visit.     ALLERGIES: Allergies  Allergen Reactions  . Bee Venom Swelling  . Penicillins     REACTION: swelling  . Quinolones Anaphylaxis and Palpitations    REACTION: nausea  . Sulfate Rash  . Celexa [Citalopram Hydrobromide] Other (See Comments)    Panic attacks  . Duloxetine Other (See Comments)    Panic attacks  . Escitalopram Other (See Comments)    Panic attacks  . Flexeril [Cyclobenzaprine] Swelling  . Moxifloxacin     REACTION: nausea  . Ranitidine Hcl     Other  reaction(s): Other (See Comments) uknown  . Sumatriptan Succinate     REACTION: nausea, dizziness, panic symptoms    FAMILY HISTORY: Family History  Problem Relation Age of Onset  . Diabetes Mother   . Hypertension Mother   . Cerebrovascular Accident Mother   . Hypertension Father   . Crohn's disease Father   . Colon cancer Paternal Grandmother   . Breast cancer Paternal Grandmother   . Crohn's disease Brother   . Anxiety disorder Sister   . Lung disease Paternal Grandfather   . Alzheimer's disease Other     SOCIAL HISTORY: Social History   Socioeconomic History  . Marital status: Married    Spouse name: Nicole Kindred  . Number of children: 2  . Years of education: Not on file  . Highest education level: 12th grade  Occupational History  . Occupation: Therapist, art  Social Needs  . Financial resource strain: Not on file  . Food insecurity:    Worry: Not on file    Inability: Not on file  . Transportation needs:    Medical: Not on file    Non-medical:  Not on file  Tobacco Use  . Smoking status: Former Smoker    Types: Cigarettes    Last attempt to quit: 04/13/2014    Years since quitting: 3.2  . Smokeless tobacco: Never Used  Substance and Sexual Activity  . Alcohol use: Yes    Alcohol/week: 0.0 oz    Comment: 1-2 per month  . Drug use: No  . Sexual activity: Yes    Birth control/protection: None  Lifestyle  . Physical activity:    Days per week: Not on file    Minutes per session: Not on file  . Stress: Not on file  Relationships  . Social connections:    Talks on phone: Not on file    Gets together: Not on file    Attends religious service: Not on file    Active member of club or organization: Not on file    Attends meetings of clubs or organizations: Not on file    Relationship status: Not on file  . Intimate partner violence:    Fear of current or ex partner: Not on file    Emotionally abused: Not on file    Physically abused: Not on file    Forced  sexual activity: Not on file  Other Topics Concern  . Not on file  Social History Narrative   Married   2 sons 1999 and 2008   Customer relations Physiological scientist since 2006   No caffeine   11/04/2014   Pt married, lives with husband and 2 children in a one level home. Drinks one cup of coffee a day, maybe 3 sodas a week. Works out at [employer] a week.    REVIEW OF SYSTEMS: Constitutional: No fevers, chills, or sweats, no generalized fatigue, change in appetite Eyes: No visual changes, double vision, eye pain Ear, nose and throat: No hearing loss, ear pain, nasal congestion, sore throat Cardiovascular: No chest pain, palpitations Respiratory:  No shortness of breath at rest or with exertion, wheezes GastrointestinaI: No nausea, vomiting, diarrhea, abdominal pain, fecal incontinence Genitourinary:  No dysuria, urinary retention or frequency Musculoskeletal:  No neck pain, back pain Integumentary: No rash, pruritus, skin lesions Neurological: as above Psychiatric: No depression, insomnia, anxiety Endocrine: No palpitations, fatigue, diaphoresis, mood swings, change in appetite, change in weight, increased thirst Hematologic/Lymphatic:  No purpura, petechiae. Allergic/Immunologic: no itchy/runny eyes, nasal congestion, recent allergic reactions, rashes  PHYSICAL EXAM: Vitals:   06/26/17 1359  BP: 122/82  Pulse: 86  SpO2: 98%   General: No acute distress.  Patient appears well-groomed.   Head:  Normocephalic/atraumatic Eyes:  Fundi examined but not visualized Neck: supple, no paraspinal tenderness, full range of motion Heart:  Regular rate and rhythm Lungs:  Clear to auscultation bilaterally Back: No paraspinal tenderness Neurological Exam: alert and oriented to person, place, and time. Attention span and concentration intact, recent and remote memory intact, fund of knowledge intact.  Speech fluent and not dysarthric, language intact.  CN II-XII intact. Bulk and tone normal,  muscle strength 5/5 throughout.  Sensation to light touch, temperature and vibration intact.  Deep tendon reflexes 2+ throughout, toes downgoing.  Finger to nose and heel to shin testing intact.  Gait normal, Romberg negative.  IMPRESSION: Intractable headache, likely migraine  PLAN: 1.  First we will try to break headache with prednisone taper 2.  If prednisone ineffective, we will start nortriptyline 25mg  at bedtime 3.  Limit use of pain relievers to no more than 2 days out of week  4. Headache diary 5.  Follow up in 3 months  Metta Clines, DO  CC: Debbrah Alar, NP

## 2017-06-26 NOTE — Patient Instructions (Signed)
1.  To break this daily cycle of headache, I will prescribe you a prednisone taper:  Take 6tabs x1day, then 5tabs x1day, then 4tabs x1day, then 3tabs x1day, then 2tabs x1day, then 1tab x1day, then STOP.  While on prednisone, do not take NSAIDs such as ibuprofen as it may upset your stomach 2.  If headache is still persistent after finishing the taper, contact me and I will start you on nortriptyline. 3.  Limit use of pain relievers to no more than 2 days out of week. 4.  Keep headache diary 5.  You may continue gabapentin, methocarbamol and sertraline 6.  Follow up in 3 months.

## 2017-07-10 ENCOUNTER — Encounter: Payer: Self-pay | Admitting: Neurology

## 2017-07-23 ENCOUNTER — Ambulatory Visit: Payer: Self-pay

## 2017-07-23 ENCOUNTER — Ambulatory Visit: Payer: BLUE CROSS/BLUE SHIELD | Admitting: Family

## 2017-07-23 DIAGNOSIS — R1012 Left upper quadrant pain: Secondary | ICD-10-CM | POA: Diagnosis not present

## 2017-07-23 DIAGNOSIS — W57XXXA Bitten or stung by nonvenomous insect and other nonvenomous arthropods, initial encounter: Secondary | ICD-10-CM | POA: Diagnosis not present

## 2017-07-23 DIAGNOSIS — R0789 Other chest pain: Secondary | ICD-10-CM | POA: Diagnosis not present

## 2017-07-23 NOTE — Telephone Encounter (Signed)
Pt c/o 4 days of chest pain that is located below breast and radiates toward the side. Pt states pain comes and goes and is present during call. Pt states that pain is different from when she has acid reflux. She states she can feel the acid in her throat but this pain is in a different location on her chest.   Answer Assessment - Initial Assessment Questions 1. LOCATION: "Where does it hurt?"       Below breat to the side towards the back 2. RADIATION: "Does the pain go anywhere else?" (e.g., into neck, jaw, arms, back)     back 3. ONSET: "When did the chest pain begin?" (Minutes, hours or days)      4 days ago 4. PATTERN "Does the pain come and go, or has it been constant since it started?"  "Does it get worse with exertion?"      Comes and goes and worse with eating and feels better when pt lays down 5. DURATION: "How long does it last" (e.g., seconds, minutes, hours)     episod 6. SEVERITY: "How bad is the pain?"  (e.g., Scale 1-10; mild, moderate, or severe)    - MILD (1-3): doesn't interfere with normal activities     - MODERATE (4-7): interferes with normal activities or awakens from sleep    - SEVERE (8-10): excruciating pain, unable to do any normal activities       4/10 dull ache 7. CARDIAC RISK FACTORS: "Do you have any history of heart problems or risk factors for heart disease?" (e.g., prior heart attack, angina; high blood pressure, diabetes, being overweight, high cholesterol, smoking, or strong family history of heart disease)     Overweight, high triglycerides, former smoker 8. PULMONARY RISK FACTORS: "Do you have any history of lung disease?"  (e.g., blood clots in lung, asthma, emphysema, birth control pills)     no 9. CAUSE: "What do you think is causing the chest pain?"     Pt does not know 10. OTHER SYMPTOMS: "Do you have any other symptoms?" (e.g., dizziness, nausea, vomiting, sweating, fever, difficulty breathing, cough)       Nausea, acid in throat like reflux and  diarrhea 11. PREGNANCY: "Is there any chance you are pregnant?" "When was your last menstrual period?"       No LMP: last week 5/7  Protocols used: CHEST PAIN-A-AH

## 2017-07-23 NOTE — Telephone Encounter (Signed)
Melissa-- please advise? 

## 2017-07-23 NOTE — Telephone Encounter (Signed)
[place] office left message for pt to call and scheduled an appt. Per verbal from PCP, ok to override session limit and have pt come in at 4:20pm today. Sent mychart message to pt.

## 2017-07-23 NOTE — Telephone Encounter (Signed)
Needs to be seen by a provider today, You can put her on my schedule if I have openings.

## 2017-07-25 NOTE — Telephone Encounter (Signed)
Melissa-- please see CareEverywhere from [place], 07/23/17.

## 2017-07-26 ENCOUNTER — Encounter: Payer: Self-pay | Admitting: Family

## 2017-07-30 DIAGNOSIS — Z6832 Body mass index (BMI) 32.0-32.9, adult: Secondary | ICD-10-CM | POA: Diagnosis not present

## 2017-07-30 DIAGNOSIS — Z124 Encounter for screening for malignant neoplasm of cervix: Secondary | ICD-10-CM | POA: Diagnosis not present

## 2017-07-30 DIAGNOSIS — Z01419 Encounter for gynecological examination (general) (routine) without abnormal findings: Secondary | ICD-10-CM | POA: Diagnosis not present

## 2017-07-30 LAB — HM PAP SMEAR: HM Pap smear: NEGATIVE

## 2017-08-01 ENCOUNTER — Encounter: Payer: Self-pay | Admitting: Family

## 2017-08-09 ENCOUNTER — Ambulatory Visit (HOSPITAL_BASED_OUTPATIENT_CLINIC_OR_DEPARTMENT_OTHER)
Admission: RE | Admit: 2017-08-09 | Discharge: 2017-08-09 | Disposition: A | Discharge status: Home / Self Care | Payer: BLUE CROSS/BLUE SHIELD | Source: Ambulatory Visit | Attending: Family Medicine | Admitting: Family Medicine

## 2017-08-09 ENCOUNTER — Encounter: Payer: Self-pay | Admitting: Family Medicine

## 2017-08-09 ENCOUNTER — Ambulatory Visit: Payer: BLUE CROSS/BLUE SHIELD | Admitting: Family Medicine

## 2017-08-09 VITALS — BP 118/84 | HR 92 | Temp 97.9°F | Ht 64.0 in | Wt 184.4 lb

## 2017-08-09 DIAGNOSIS — R0781 Pleurodynia: Secondary | ICD-10-CM

## 2017-08-09 MED ORDER — TRAMADOL HCL 50 MG PO TABS: 50.0000 mg | ORAL_TABLET | Freq: Three times a day (TID) | ORAL | 0 refills | Status: DC | PRN

## 2017-08-09 NOTE — Progress Notes (Signed)
Musculoskeletal Exam  Patient: Jaclyn Tucker DOB: Jul 11, 1975  DOS: 08/09/2017  SUBJECTIVE:  Chief Complaint:   Chief Complaint  Patient presents with  . Insect Bite    tick bite    Jaclyn Tucker is a 42 y.o.  female for evaluation and treatment of rib pain. Here w husband.   Onset:  3 weeks ago. No inj or change in activity.  Location: L ribcage Character:  aching and sharp  Progression of issue:  has worsened slightly Associated symptoms: Feels like a pull Treatment: to date has been OTC NSAIDS and heat.   Neurovascular symptoms: no   ROS: Musculoskeletal/Extremities: +L side pain  Past Medical History:  Diagnosis Date  . Anxiety   . Depression   . Fibromyalgia 06/2014  . GERD (gastroesophageal reflux disease)   . IBS (irritable bowel syndrome) 11/04/2014  . Migraine   . Vitamin B12 deficiency     Objective: VITAL SIGNS: BP 118/84 (BP Location: Left Arm, Patient Position: Sitting, Cuff Size: Normal)   Pulse 92   Temp 97.9 F (36.6 C) (Oral)   Ht 5\' 4"  (1.626 m)   Wt 184 lb 6 oz (83.6 kg)   SpO2 95%   BMI 31.65 kg/m  Constitutional: Well formed, well developed. No acute distress. Cardiovascular: Brisk cap refill Thorax & Lungs: No accessory muscle use Musculoskeletal: L rib.   Normal ROM of LUE Tenderness to palpation: yes, over R8 on L Deformity: no Ecchymosis: no Neurologic: Normal sensory function. No cerebellar signs Psychiatric: Normal mood. Age appropriate judgment and insight. Alert & oriented x 3.    Assessment:  Rib pain on left side - Plan: DG Ribs Unilateral Left, traMADol (ULTRAM) 50 MG tablet  Plan: Orders as above. Ice, heat, tylenol, ibuprofen, contact us in 2 weeks if no better and will refer to Dr. Barbaraann Barthel. Take deep breaths.  Warning about tramadol use. She cannot tolerate hydrocodone 2/2 AE's but is willing to try this.  F/u prn. The patient voiced understanding and agreement to the plan.   [place],  DO 08/09/17  11:48 AM

## 2017-08-09 NOTE — Patient Instructions (Addendum)
We will let you know the results of your X-ray via MyChart or phone.  Ice/cold pack over area for 10-15 min twice daily.  Heat (pad or rice pillow in microwave) over affected area, 10-15 minutes twice daily.   Ibuprofen 400-600 mg (2-3 over the counter strength tabs) every 6 hours as needed for pain.  OK to take Tylenol 1000 mg (2 extra strength tabs) or 975 mg (3 regular strength tabs) every 6 hours as needed.  Do not drink alcohol, do any illicit/street drugs, drive or do anything that requires alertness while on this Tramadol.   Take 10 deep breaths every hour you are awake.  Let us know if you need anything.

## 2017-08-09 NOTE — Progress Notes (Signed)
Pre visit review using our clinic review tool, if applicable. No additional management support is needed unless otherwise documented below in the visit note. 

## 2017-08-19 IMAGING — CT CT HEAD W/O CM
1 series · 16 of 30 positions shown, 20 images · non-contrast
Comparison: March 11, 2014

CLINICAL DATA: One week history of left-sided headache with blurred
vision. Intermittent left upper extremity weakness

EXAM:
CT HEAD WITHOUT CONTRAST
TECHNIQUE: Contiguous axial images were obtained from the base of the skull
through the vertex without intravenous contrast.

[Series 2: head 4.8 h37s · axial · 0.43mm/px · z∈[-187,-54]mm · 16 of 32 slices shown, 20 images]
[im 2/32  brain]
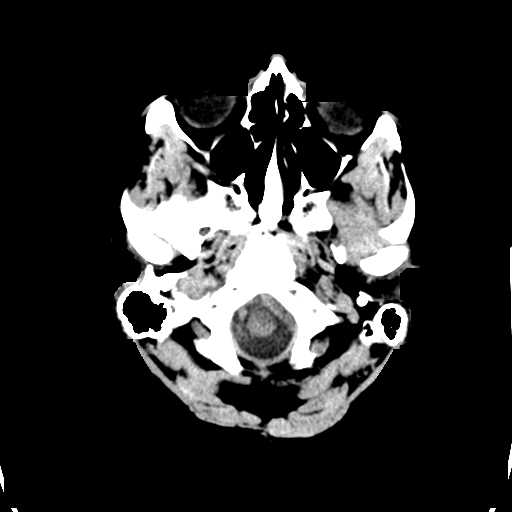
[im 2/32  bone]
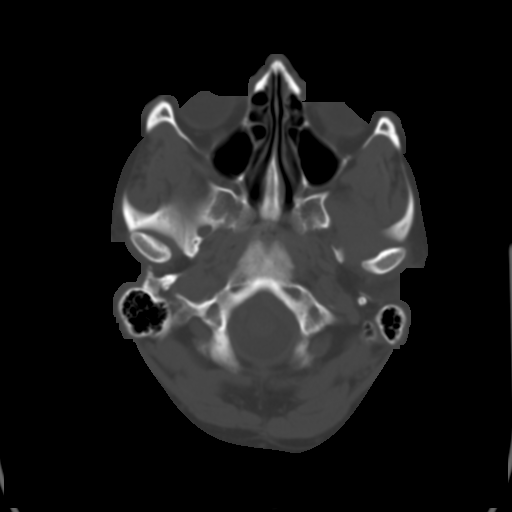
[im 4/32  brain]
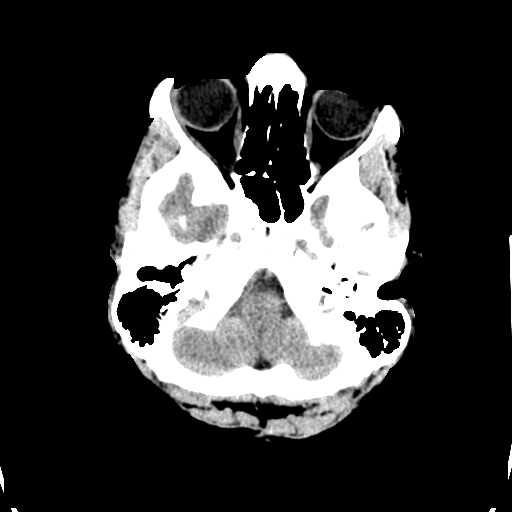
[im 6/32  brain]
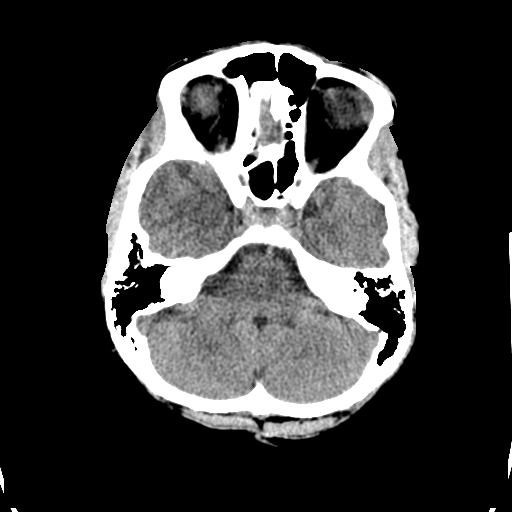
[im 8/32  brain]
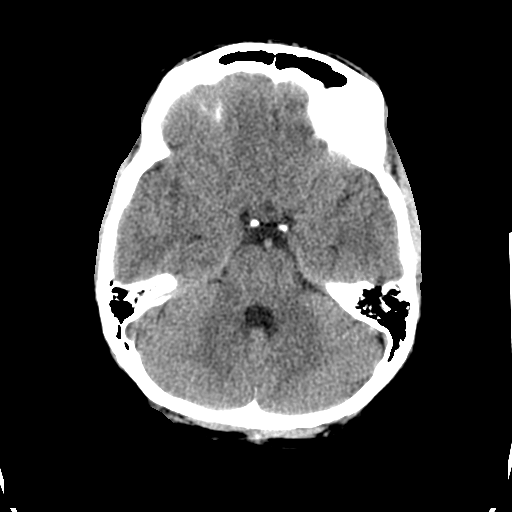
[im 9/32  brain]
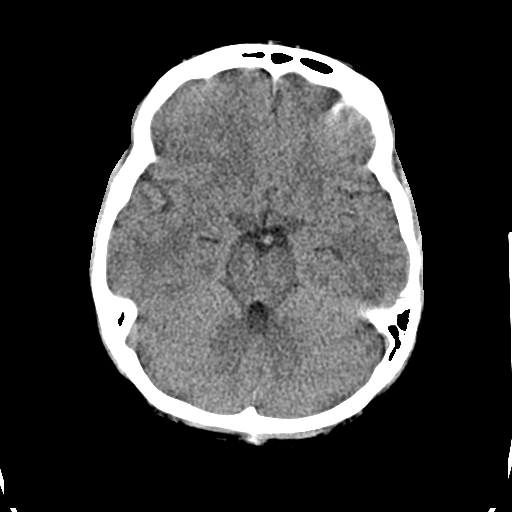
[im 9/32  bone]
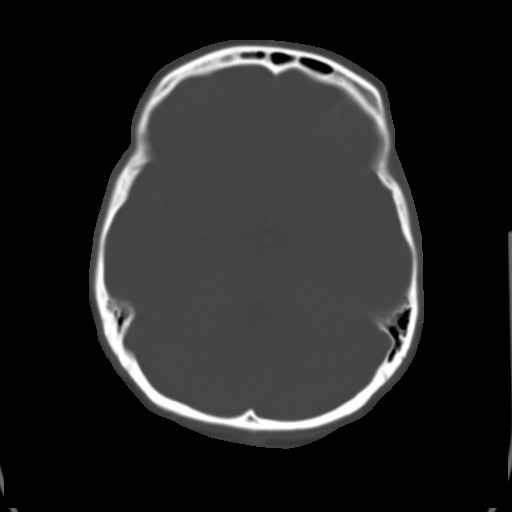
[im 11/32  brain]
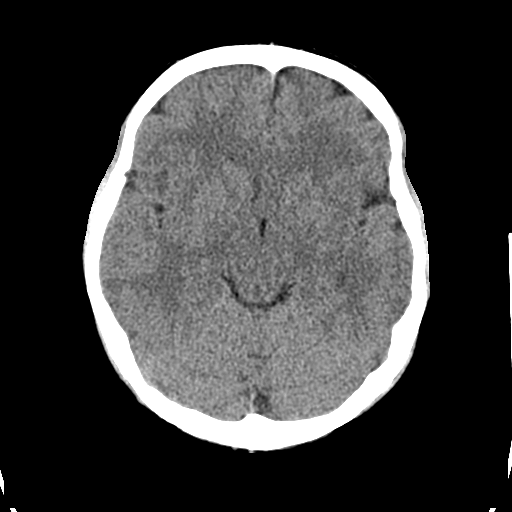
[im 13/32  brain]
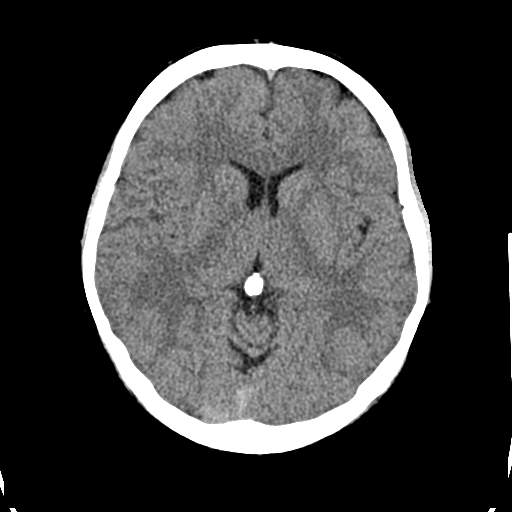
[im 15/32  brain]
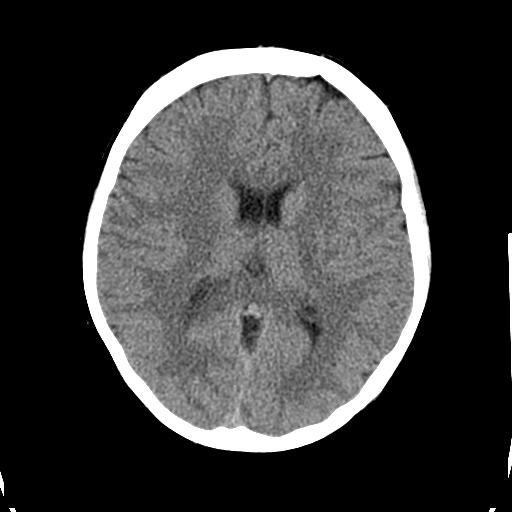
[im 17/32  brain]
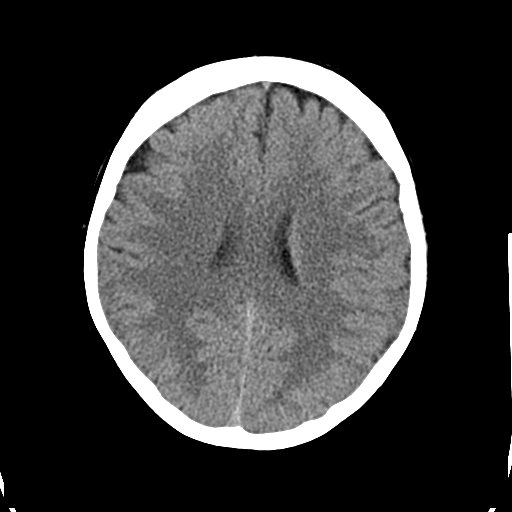
[im 17/32  bone]
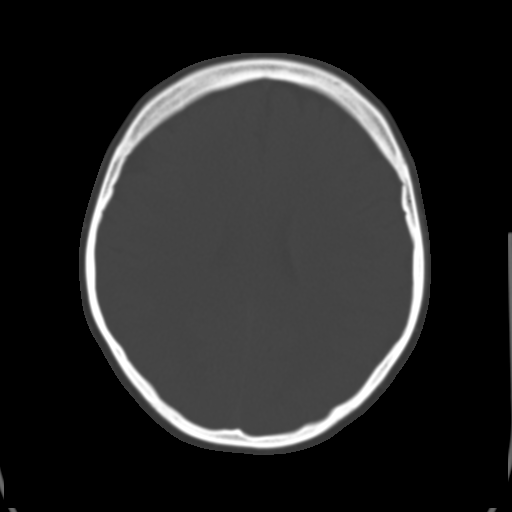
[im 19/32  brain]
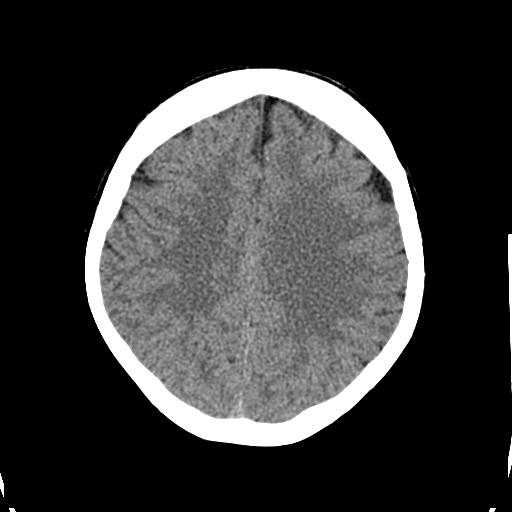
[im 21/32  brain]
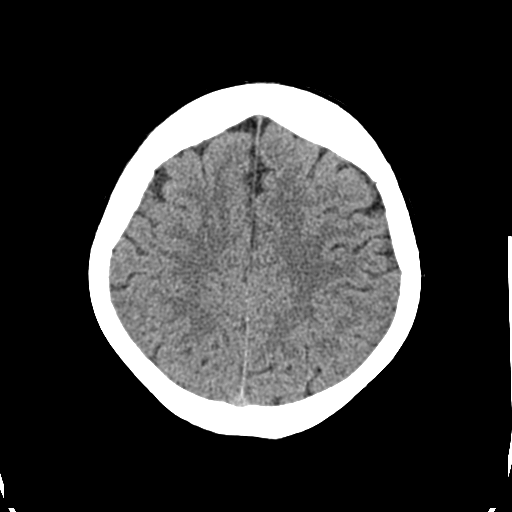
[im 23/32  brain]
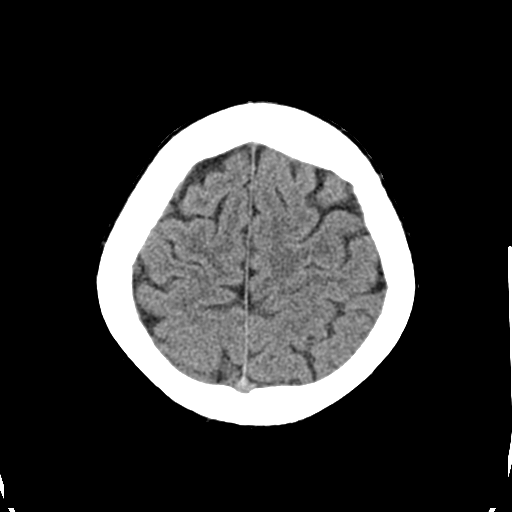
[im 24/32  brain]
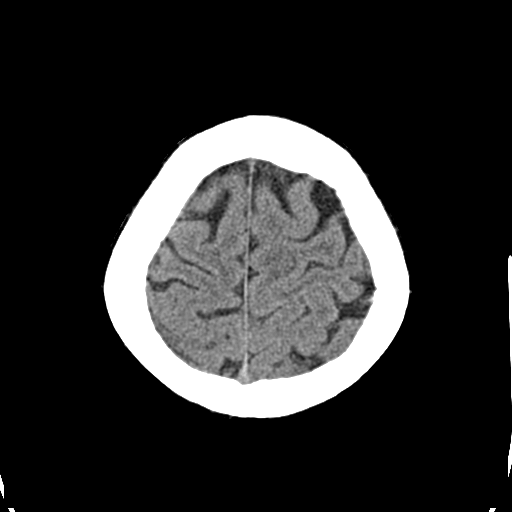
[im 24/32  bone]
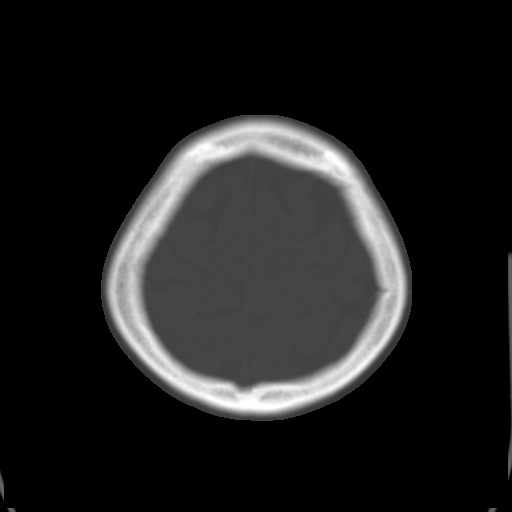
[im 26/32  brain]
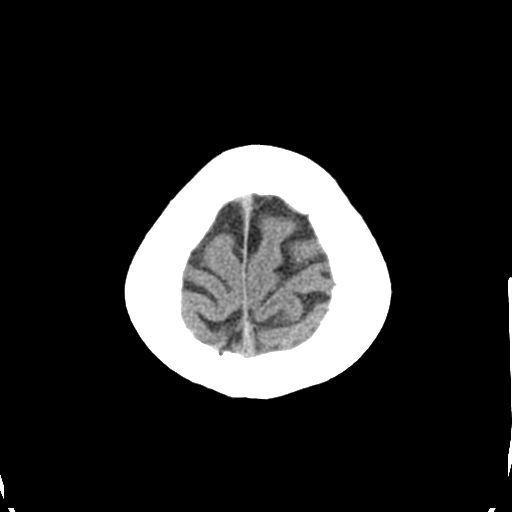
[im 28/32  brain]
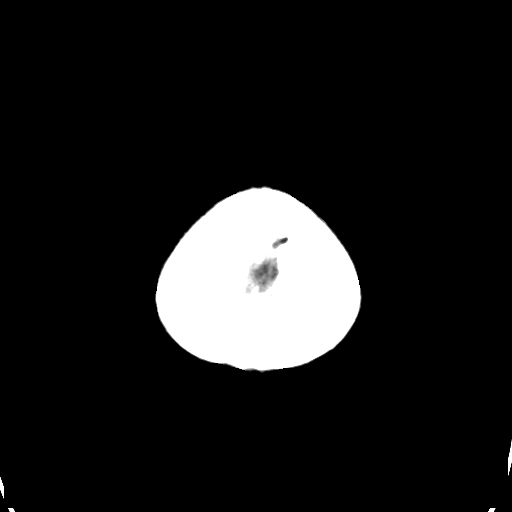
[im 30/32  brain]
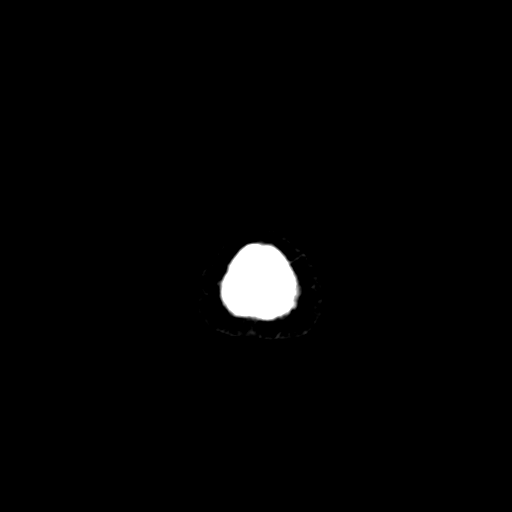

[16 of 30 positions shown; findings below may reference images not displayed]

FINDINGS: The ventricles are normal in size and configuration. There is no
intracranial mass, hemorrhage, extra-axial fluid collection, or
midline shift. Gray-white compartments are normal. No acute infarct
is evident. The bony calvarium appears intact. The visualized
mastoid air cells are clear. No intraorbital lesions are identified.
IMPRESSION: Study within normal limits.

## 2017-08-21 DIAGNOSIS — F410 Panic disorder [episodic paroxysmal anxiety] without agoraphobia: Secondary | ICD-10-CM | POA: Diagnosis not present

## 2017-08-21 DIAGNOSIS — F411 Generalized anxiety disorder: Secondary | ICD-10-CM | POA: Diagnosis not present

## 2017-10-02 ENCOUNTER — Encounter: Payer: Self-pay | Admitting: Family

## 2017-10-02 NOTE — Telephone Encounter (Signed)
Please advise in PCPs absence.  

## 2017-10-03 ENCOUNTER — Encounter (HOSPITAL_COMMUNITY): Payer: Self-pay

## 2017-10-03 ENCOUNTER — Ambulatory Visit (INDEPENDENT_AMBULATORY_CARE_PROVIDER_SITE_OTHER): Payer: BLUE CROSS/BLUE SHIELD

## 2017-10-03 ENCOUNTER — Ambulatory Visit (HOSPITAL_COMMUNITY)
Admission: EM | Admit: 2017-10-03 | Discharge: 2017-10-03 | Disposition: A | Discharge status: Home / Self Care | Payer: BLUE CROSS/BLUE SHIELD | Attending: Family Medicine | Admitting: Family Medicine

## 2017-10-03 DIAGNOSIS — R079 Chest pain, unspecified: Secondary | ICD-10-CM | POA: Diagnosis not present

## 2017-10-03 DIAGNOSIS — M25511 Pain in right shoulder: Secondary | ICD-10-CM | POA: Diagnosis not present

## 2017-10-03 MED ORDER — PREDNISONE 10 MG (21) PO TBPK: ORAL_TABLET | Freq: Every day | ORAL | 0 refills | Status: DC

## 2017-10-03 MED ORDER — DICLOFENAC SODIUM 75 MG PO TBEC: 75.0000 mg | DELAYED_RELEASE_TABLET | Freq: Two times a day (BID) | ORAL | 0 refills | Status: DC

## 2017-10-03 NOTE — ED Triage Notes (Signed)
Pt presents with right shoulder pain

## 2017-10-04 NOTE — ED Provider Notes (Signed)
[place]   [id number] 10/03/17 Arrival Time: [place]:  1. Right shoulder pain, unspecified chronicity     Imaging: Dg Chest 2 View  Result Date: 10/03/2017 CLINICAL DATA:  Chest pain. EXAM: CHEST - 2 VIEW COMPARISON:  Radiographs of March 04, 2016. FINDINGS: The heart size and mediastinal contours are within normal limits. Both lungs are clear. No pneumothorax or pleural effusion is noted. The visualized skeletal structures are unremarkable. IMPRESSION: No active cardiopulmonary disease. Electronically Signed   By: Marijo Conception, M.D.   On: 10/03/2017 20:06     Meds ordered this encounter  Medications  . diclofenac (VOLTAREN) 75 MG EC tablet    Sig: Take 1 tablet (75 mg total) by mouth 2 (two) times daily.    Dispense:  14 tablet    Refill:  0  . predniSONE (STERAPRED UNI-PAK 21 TAB) 10 MG (21) TBPK tablet    Sig: Take by mouth daily. Take as directed.    Dispense:  21 tablet    Refill:  0    Follow-up Information    Debbrah Alar, NP.   Specialty:  Internal Medicine Why:  As needed. Contact information: [address] [place] [address] [phone]           Reviewed expectations re: course of current medical issues. Questions answered. Outlined signs and symptoms indicating need for more acute intervention. Patient verbalized understanding. After Visit Summary given.  SUBJECTIVE: History from: patient. Jaclyn Tucker is a 42 y.o. female who reports intermittent moderate pain of her right shoulder that is stable; described as aching without radiation. Onset: gradual, over the past week or two. Injury/trama: no. Relieved by: rest. Worsened by: certain movements. Associated symptoms: none reported. Extremity sensation changes or weakness: none. Self treatment: ibuprofen with mild help. History of similar: no  ROS: As per HPI.   OBJECTIVE:  Vitals:   10/03/17 1909  BP: (!) 147/82  Pulse: 87    Resp: 18  Temp: 97.9 F (36.6 C)  TempSrc: Oral  SpO2: 100%    General appearance: alert; no distress Extremities: warm and well perfused; symmetrical with no gross deformities; diffuse tenderness over her right shoulder, more posterior with no swelling and no bruising; ROM: normal but with some discomfort CV: normal extremity capillary refill Skin: warm and dry Neurologic: normal gait; normal symmetric reflexes in all extremities; normal sensation in all extremities Psychological: alert and cooperative; normal mood and affect  Allergies  Allergen Reactions  . Bee Venom Swelling  . Penicillins     REACTION: swelling  . Quinolones Anaphylaxis and Palpitations    REACTION: nausea  . Sulfate Rash  . Celexa [Citalopram Hydrobromide] Other (See Comments)    Panic attacks  . Duloxetine Other (See Comments)    Panic attacks  . Escitalopram Other (See Comments)    Panic attacks  . Flexeril [Cyclobenzaprine] Swelling  . Moxifloxacin     REACTION: nausea  . Ranitidine Hcl     Other reaction(s): Other (See Comments) uknown  . Sumatriptan Succinate     REACTION: nausea, dizziness, panic symptoms    Past Medical History:  Diagnosis Date  . Anxiety   . Depression   . Fibromyalgia 06/2014  . GERD (gastroesophageal reflux disease)   . IBS (irritable bowel syndrome) 11/04/2014  . Migraine   . Vitamin B12 deficiency    Social History   Socioeconomic History  . Marital status: Married    Spouse name: Nicole Kindred  .  Number of children: 2  . Years of education: Not on file  . Highest education level: 12th grade  Occupational History  . Occupation: Therapist, art  Social Needs  . Financial resource strain: Not on file  . Food insecurity:    Worry: Not on file    Inability: Not on file  . Transportation needs:    Medical: Not on file    Non-medical: Not on file  Tobacco Use  . Smoking status: Former Smoker    Types: Cigarettes    Last attempt to quit: 04/13/2014    Years since  quitting: 3.4  . Smokeless tobacco: Never Used  Substance and Sexual Activity  . Alcohol use: Yes    Alcohol/week: 0.0 oz    Comment: 1-2 per month  . Drug use: No  . Sexual activity: Yes    Birth control/protection: None  Lifestyle  . Physical activity:    Days per week: Not on file    Minutes per session: Not on file  . Stress: Not on file  Relationships  . Social connections:    Talks on phone: Not on file    Gets together: Not on file    Attends religious service: Not on file    Active member of club or organization: Not on file    Attends meetings of clubs or organizations: Not on file    Relationship status: Not on file  . Intimate partner violence:    Fear of current or ex partner: Not on file    Emotionally abused: Not on file    Physically abused: Not on file    Forced sexual activity: Not on file  Other Topics Concern  . Not on file  Social History Narrative   Married   2 sons 1999 and 2008   Customer relations Physiological scientist since 2006   No caffeine   11/04/2014   Pt married, lives with husband and 2 children in a one level home. Drinks one cup of coffee a day, maybe 3 sodas a week. Works out at [employer] a week.   Family History  Problem Relation Age of Onset  . Diabetes Mother   . Hypertension Mother   . Cerebrovascular Accident Mother   . Hypertension Father   . Crohn's disease Father   . Colon cancer Paternal Grandmother   . Breast cancer Paternal Grandmother   . Crohn's disease Brother   . Anxiety disorder Sister   . Lung disease Paternal Grandfather   . Alzheimer's disease Other    Past Surgical History:  Procedure Laterality Date  . g2 p2        Vanessa Kick, MD 10/04/17 1345

## 2017-10-11 ENCOUNTER — Ambulatory Visit: Payer: BLUE CROSS/BLUE SHIELD | Admitting: Neurology

## 2017-10-15 ENCOUNTER — Encounter: Payer: Self-pay | Admitting: Family Medicine

## 2017-10-15 ENCOUNTER — Ambulatory Visit: Payer: BLUE CROSS/BLUE SHIELD | Admitting: Family Medicine

## 2017-10-15 VITALS — BP 136/82 | HR 78 | Temp 97.9°F | Ht 64.0 in | Wt 190.6 lb

## 2017-10-15 DIAGNOSIS — J0100 Acute maxillary sinusitis, unspecified: Secondary | ICD-10-CM | POA: Diagnosis not present

## 2017-10-15 MED ORDER — AZITHROMYCIN 250 MG PO TABS: ORAL_TABLET | ORAL | 0 refills | Status: DC

## 2017-10-15 MED ORDER — BETAMETHASONE SOD PHOS & ACET 6 (3-3) MG/ML IJ SUSP
12.0000 mg | Freq: Once | INTRAMUSCULAR | Status: AC
Start: 2017-10-15 — End: 2017-10-15
  Administered 2017-10-15: 12 mg via INTRAMUSCULAR

## 2017-10-15 NOTE — Patient Instructions (Signed)

## 2017-10-15 NOTE — Progress Notes (Signed)
Patient: Jaclyn Tucker MRN: [id number] DOB: Apr 06, 1975 PCP: Debbrah Alar, NP     Subjective:  Chief Complaint  Patient presents with  . congested?  . feels SOB    HPI: The patient is a 42 y.o. female who presents today for URI/sinus symptoms. She started to feel bad about 1 week ago. She feels like she can't get a good breath in, but her nose is so congested it's hard to get a breath in. She had a CXR done a week or so ago and it was normal. She woke up Saturday AM and felt like her face was swollen and felt puffy and could not get a breath in her nose. She doesn't feel like she is able to get any mucous out. Her teeth are hurting her really bad. Was on prednisone last week and it was a taper. No foul smell. She has no cough. No wheezing, but a shortness of breath because she can't breath in through her nose. Fine with mouth inhalation. She has had no n/v/d. No fever/chills.   Review of Systems  Constitutional: Positive for fatigue. Negative for chills and fever.  HENT: Positive for congestion, sinus pressure and sinus pain. Negative for ear discharge, ear pain and sore throat.   Respiratory: Positive for shortness of breath. Negative for cough, chest tightness and wheezing.   Gastrointestinal: Negative for abdominal pain and nausea.  Musculoskeletal: Negative for neck pain.  Neurological: Positive for headaches. Negative for dizziness.  Psychiatric/Behavioral: The patient is nervous/anxious.     Allergies Patient is allergic to bee venom; penicillins; quinolones; sulfate; celexa [citalopram hydrobromide]; duloxetine; escitalopram; flexeril [cyclobenzaprine]; moxifloxacin; ranitidine hcl; and sumatriptan succinate.  Past Medical History Patient  has a past medical history of Anxiety, Depression, Fibromyalgia (06/2014), GERD (gastroesophageal reflux disease), IBS (irritable bowel syndrome) (11/04/2014), Migraine, and Vitamin B12 deficiency.  Surgical History Patient  has a past  surgical history that includes g2 p2.  Family History Pateint's family history includes Alzheimer's disease in her other; Anxiety disorder in her sister; Breast cancer in her paternal grandmother; Cerebrovascular Accident in her mother; Colon cancer in her paternal grandmother; Crohn's disease in her brother and father; Diabetes in her mother; Hypertension in her father and mother; Lung disease in her paternal grandfather.  Social History Patient  reports that she quit smoking about 3 years ago. Her smoking use included cigarettes. She has never used smokeless tobacco. She reports that she drinks alcohol. She reports that she does not use drugs.    Objective: Vitals:   10/15/17 1120  BP: 136/82  Pulse: 78  Temp: 97.9 F (36.6 C)  TempSrc: Oral  SpO2: 98%  Weight: 190 lb 9.6 oz (86.5 kg)  Height: 5\' 4"  (1.626 m)    Body mass index is 32.72 kg/m.  Physical Exam  Constitutional: She appears well-developed and well-nourished.  HENT:  Right Ear: External ear normal.  Left Ear: External ear normal.  Mouth/Throat: Oropharynx is clear and moist.  Tm pearly with light reflex Left nasal turbinate with severe edema.  Right nasal turbinate with mild-moderate edema  TTP over maxillary sinuses. L>R  Neck: Normal range of motion. Neck supple.  Cardiovascular: Normal rate, regular rhythm and normal heart sounds.  Pulmonary/Chest: Effort normal and breath sounds normal.  Lymphadenopathy:    She has no cervical adenopathy.  Vitals reviewed.      Assessment/plan: 1. Acute non-recurrent maxillary sinusitis zpack since allergy to pcn, steroid shot and she is to start flonase, cool mist humidifier. Let me know  if not better or f/u with pcp.  - betamethasone acetate-betamethasone sodium phosphate (CELESTONE) injection 12 mg    Return if symptoms worsen or fail to improve.   Orma Flaming, MD [place]   10/15/2017

## 2017-10-22 DIAGNOSIS — J329 Chronic sinusitis, unspecified: Secondary | ICD-10-CM | POA: Diagnosis not present

## 2017-10-22 DIAGNOSIS — J0140 Acute pansinusitis, unspecified: Secondary | ICD-10-CM | POA: Diagnosis not present

## 2017-10-25 ENCOUNTER — Encounter: Payer: Self-pay | Admitting: Family

## 2017-11-29 DIAGNOSIS — F410 Panic disorder [episodic paroxysmal anxiety] without agoraphobia: Secondary | ICD-10-CM | POA: Diagnosis not present

## 2017-11-29 DIAGNOSIS — F411 Generalized anxiety disorder: Secondary | ICD-10-CM | POA: Diagnosis not present

## 2017-12-12 ENCOUNTER — Ambulatory Visit: Payer: BLUE CROSS/BLUE SHIELD | Admitting: Family Medicine

## 2017-12-12 ENCOUNTER — Encounter: Payer: Self-pay | Admitting: Family Medicine

## 2017-12-12 VITALS — BP 124/80 | HR 92 | Temp 98.4°F | Resp 12 | Ht 64.0 in | Wt 189.0 lb

## 2017-12-12 DIAGNOSIS — L853 Xerosis cutis: Secondary | ICD-10-CM | POA: Diagnosis not present

## 2017-12-12 DIAGNOSIS — L299 Pruritus, unspecified: Secondary | ICD-10-CM

## 2017-12-12 MED ORDER — HYDROXYZINE HCL 25 MG PO TABS: 25.0000 mg | ORAL_TABLET | Freq: Three times a day (TID) | ORAL | 0 refills | Status: AC | PRN

## 2017-12-12 MED ORDER — TRIAMCINOLONE 0.1 % CREAM:EUCERIN CREAM 1:1: 1.0000 "application " | TOPICAL_CREAM | Freq: Two times a day (BID) | CUTANEOUS | 0 refills | Status: AC | PRN

## 2017-12-12 NOTE — Patient Instructions (Signed)
  Ms.Taylon Pointer I have seen you today for an acute visit.  A few things to remember from today's visit:   Dry skin - Plan: Triamcinolone Acetonide (TRIAMCINOLONE 0.1 % CREAM : EUCERIN) CREA, TSH  Skin pruritus - Plan: Triamcinolone Acetonide (TRIAMCINOLONE 0.1 % CREAM : EUCERIN) CREA, hydrOXYzine (ATARAX/VISTARIL) 25 MG tablet   If medications prescribed today, they will not be refill upon request, a follow up appointment with PCP will be necessary to discuss continuation of of treatment if appropriate.  Pruritus Pruritus is an itching feeling. There are many different conditions and factors that can make your skin itchy. Dry skin is one of the most common causes of itching. Most cases of itching do not require medical attention. Itchy skin can turn into a rash. Follow these instructions at home: Watch your pruritus for any changes. Take these steps to help with your condition: Skin Care  Moisturize your skin as needed. A moisturizer that contains petroleum jelly is best for keeping moisture in your skin.  Take or apply medicines only as directed by your health care provider. This may include: ? Corticosteroid cream. ? Anti-itch lotions. ? Oral anti-histamines.  Apply cool compresses to the affected areas.  Try taking a bath with: ? Epsom salts. Follow the instructions on the packaging. You can get these at your local pharmacy or grocery store. ? Baking soda. Pour a small amount into the bath as directed by your health care provider. ? Colloidal oatmeal. Follow the instructions on the packaging. You can get this at your local pharmacy or grocery store.  Try applying baking soda paste to your skin. Stir water into baking soda until it reaches a paste-like consistency.  Do not scratch your skin.  Avoid hot showers or baths, which can make itching worse. A cold shower may help with itching as long as you use a moisturizer after.  Avoid scented soaps, detergents, and perfumes.  Use gentle soaps, detergents, perfumes, and other cosmetic products. General instructions  Avoid wearing tight clothes.  Keep a journal to help track what causes your itch. Write down: ? What you eat. ? What cosmetic products you use. ? What you drink. ? What you wear. This includes jewelry.  Use a humidifier. This keeps the air moist, which helps to prevent dry skin. Contact a health care provider if:  The itching does not go away after several days.  You sweat at night.  You have weight loss.  You are unusually thirsty.  You urinate more than normal.  You are more tired than normal.  You have abdominal pain.  Your skin tingles.  You feel weak.  Your skin or the whites of your eyes look yellow (jaundice).  Your skin feels numb. This information is not intended to replace advice given to you by your health care provider. Make sure you discuss any questions you have with your health care provider. Document Released: 11/09/2010 Document Revised: 08/05/2015 Document Reviewed: 02/23/2014 Elsevier Interactive Patient Education  [employer].    In general please monitor for signs of worsening symptoms and seek immediate medical attention if any concerning.  If symptoms are not resolved in 2 weeks you should schedule a follow up appointment with your doctor, before if needed.  I hope you get better soon!

## 2017-12-12 NOTE — Progress Notes (Signed)
ACUTE VISIT   HPI:  Chief Complaint  Patient presents with  . itchy skin    [name] is a 42 y.o. female with Hx of chronic fatigue,anxiety, and fibromyalgia, who is here today complaining of intense skin pruritus for the past month and worse for the past 4 days. She has not noted rash.  She has not identified exacerbating or alleviating factors.  Hx of contact dermatitis. She has taken Benadryl, it has not helped.  She denies any new medication, detergent, soap, or body product. No known insect bite or outdoor exposures to plants. No sick contact.  Problem seems to be stable.  She denies oral lesions/edema,cough, wheezing, dyspnea, abdominal pain, nausea, or vomiting.   Review of Systems  Constitutional: Positive for fatigue and unexpected weight change. Negative for appetite change, chills and fever.  HENT: Negative for congestion, mouth sores, sore throat, trouble swallowing and voice change.   Eyes: Negative for discharge and redness.  Respiratory: Negative for cough, shortness of breath and wheezing.   Cardiovascular: Negative for chest pain, palpitations and leg swelling.  Gastrointestinal: Negative for abdominal pain, diarrhea, nausea and vomiting.  Musculoskeletal: Positive for myalgias. Negative for gait problem and joint swelling.  Skin: Negative for color change and rash.  Allergic/Immunologic: Positive for environmental allergies.  Neurological: Negative for weakness, numbness and headaches.      Current Outpatient Medications on File Prior to Visit  Medication Sig Dispense Refill  . ALPRAZolam (XANAX) 0.5 MG tablet Take 0.5 mg by mouth daily as needed.     . cyanocobalamin (,VITAMIN B-12,) 1000 MCG/ML injection Inject 1 mL (1,000 mcg total) into the muscle every 30 (thirty) days. 3 mL 4  . EPINEPHrine 0.3 mg/0.3 mL IJ SOAJ injection As needed.    . gabapentin (NEURONTIN) 100 MG capsule TAKE 1 CAPSULE BY MOUTH THREE TIMES DAILY 90 capsule  2  . methocarbamol (ROBAXIN) 750 MG tablet Take 1 tablet (750 mg total) by mouth 3 (three) times daily. (Patient taking differently: Take 750 mg by mouth 3 (three) times daily as needed. ) 45 tablet 0  . pantoprazole (PROTONIX) 40 MG tablet Take 1 tablet (40 mg total) by mouth daily. 30 tablet 3  . sertraline (ZOLOFT) 50 MG tablet Take 1 tablet by mouth daily.    . Syringe/Needle, Disp, (SYRINGE 3CC/20GX1-1/2") 20G X 1-1/2" 3 ML MISC Use to inject b12 12 each 0  . valACYclovir (VALTREX) 1000 MG tablet As needed.     No current facility-administered medications on file prior to visit.      Past Medical History:  Diagnosis Date  . Anxiety   . Depression   . Fibromyalgia 06/2014  . GERD (gastroesophageal reflux disease)   . IBS (irritable bowel syndrome) 11/04/2014  . Migraine   . Vitamin B12 deficiency    Allergies  Allergen Reactions  . Bee Venom Swelling  . Cyclobenzaprine Swelling  . Moxifloxacin Anaphylaxis and Palpitations    REACTION: nausea REACTION: nausea REACTION: nausea  . Moxifloxacin Hcl In Nacl Anaphylaxis  . Penicillins Other (See Comments)    REACTION: swelling REACTION: swelling Parents told her she couldn't take it  . Quinolones Anaphylaxis and Palpitations    REACTION: nausea  . Shellfish Allergy Rash  . Sulfate Rash  . Sumatriptan Succinate     Other reaction(s): Dizziness, Other REACTION: nausea, dizziness, panic symptoms REACTION: nausea, dizziness, panic symptoms  . Celexa [Citalopram Hydrobromide] Other (See Comments)    Panic attacks  . Duloxetine Other (  See Comments)    Panic attacks  . Escitalopram Other (See Comments)    Panic attacks  . Ranitidine Hcl     Other reaction(s): Other (See Comments) uknown  . Sumatriptan Succinate     REACTION: nausea, dizziness, panic symptoms    Social History   Socioeconomic History  . Marital status: Married    Spouse name: Jaclyn Tucker  . Number of children: 2  . Years of education: Not on file  . Highest  education level: 12th grade  Occupational History  . Occupation: Therapist, art  Social Needs  . Financial resource strain: Not on file  . Food insecurity:    Worry: Not on file    Inability: Not on file  . Transportation needs:    Medical: Not on file    Non-medical: Not on file  Tobacco Use  . Smoking status: Former Smoker    Types: Cigarettes    Last attempt to quit: 04/13/2014    Years since quitting: 3.6  . Smokeless tobacco: Never Used  Substance and Sexual Activity  . Alcohol use: Yes    Alcohol/week: 0.0 standard drinks    Comment: 1-2 per month  . Drug use: No  . Sexual activity: Yes    Birth control/protection: None  Lifestyle  . Physical activity:    Days per week: Not on file    Minutes per session: Not on file  . Stress: Not on file  Relationships  . Social connections:    Talks on phone: Not on file    Gets together: Not on file    Attends religious service: Not on file    Active member of club or organization: Not on file    Attends meetings of clubs or organizations: Not on file    Relationship status: Not on file  Other Topics Concern  . Not on file  Social History Narrative   Married   2 sons 1999 and 2008   Customer relations Physiological scientist since 2006   No caffeine   11/04/2014   Pt married, lives with husband and 2 children in a one level home. Drinks one cup of coffee a day, maybe 3 sodas a week. Works out at [employer] a week.    Vitals:   12/12/17 1514  BP: 124/80  Pulse: 92  Resp: 12  Temp: 98.4 F (36.9 C)  SpO2: 97%   Body mass index is 32.44 kg/m.    Physical Exam  Nursing note and vitals reviewed. Constitutional: She is oriented to person, place, and time. She appears well-developed. No distress.  HENT:  Head: Normocephalic and atraumatic.  Mouth/Throat: Oropharynx is clear and moist and mucous membranes are normal.  Eyes: Conjunctivae are normal.  Cardiovascular: Normal rate and regular rhythm.  Respiratory: Effort  normal and breath sounds normal. No respiratory distress.  Musculoskeletal: She exhibits no edema.  Lymphadenopathy:    She has no cervical adenopathy.  Neurological: She is alert and oriented to person, place, and time. She has normal strength. Gait normal.  Skin: Skin is warm and dry. No rash noted. No erythema.  Scratching signs on left lower back. Fine scaly/dry areas, no erythema.  Psychiatric: Her mood appears anxious.  Well groomed, good eye contact.      ASSESSMENT AND PLAN:   Ms. Capitola was seen today for itchy skin.  Diagnoses and all orders for this visit:  Skin pruritus -     Triamcinolone Acetonide (TRIAMCINOLONE 0.1 % CREAM : EUCERIN) CREA; Apply  1 application topically 2 (two) times daily as needed for rash or itching. -     hydrOXYzine (ATARAX/VISTARIL) 25 MG tablet; Take 1 tablet (25 mg total) by mouth every 8 (eight) hours as needed for up to 10 days for itching.  Dry skin -     Triamcinolone Acetonide (TRIAMCINOLONE 0.1 % CREAM : EUCERIN) CREA; Apply 1 application topically 2 (two) times daily as needed for rash or itching. -     TSH   No rash appreciated. Possible etiologies of skin pruritus discussed. ? Dry skin,neurodermatitis,systemic illness among some to consider. Topical moisturizer with steroid cream may help.  Side effects of Hydroxyzine discussed. F/U with PCP as needed.    Return if symptoms worsen or fail to improve.      Alver Leete G. [place], MD  [facility] [place] office.

## 2017-12-13 ENCOUNTER — Telehealth: Payer: Self-pay | Admitting: Family

## 2017-12-13 LAB — TSH: TSH: 1.27 u[IU]/mL (ref 0.35–4.50)

## 2017-12-13 NOTE — Telephone Encounter (Signed)
Copied from [place] [id number]. Topic: General - Other >> Dec 13, 2017  9:48 AM Cecelia Byars, NT wrote: Reason for CRM: [id number] - [place], [place] called and would like to know the compound dose age of Triamcinolone Acetonide (TRIAMCINOLONE 0.1 % CREAM : EUCERIN) CREAM, please call  [phone] (Phone) [phone] (Fax)

## 2017-12-14 ENCOUNTER — Encounter: Payer: Self-pay | Admitting: Family Medicine

## 2017-12-14 NOTE — Telephone Encounter (Signed)
Eucerin with triamcinolone ration 1:1. Thanks, BJ

## 2017-12-14 NOTE — Telephone Encounter (Signed)
Pharmacy notified of ratio.

## 2017-12-24 ENCOUNTER — Other Ambulatory Visit: Payer: Self-pay | Admitting: Family

## 2017-12-24 NOTE — Telephone Encounter (Signed)
Last refill on 04/17/2017  #90 with 2 refills.

## 2018-02-14 DIAGNOSIS — F411 Generalized anxiety disorder: Secondary | ICD-10-CM | POA: Diagnosis not present

## 2018-02-14 DIAGNOSIS — F410 Panic disorder [episodic paroxysmal anxiety] without agoraphobia: Secondary | ICD-10-CM | POA: Diagnosis not present

## 2018-02-27 ENCOUNTER — Ambulatory Visit: Payer: BLUE CROSS/BLUE SHIELD | Admitting: Internal Medicine

## 2018-02-27 ENCOUNTER — Ambulatory Visit (HOSPITAL_BASED_OUTPATIENT_CLINIC_OR_DEPARTMENT_OTHER)
Admission: RE | Admit: 2018-02-27 | Discharge: 2018-02-27 | Disposition: A | Discharge status: Home / Self Care | Payer: BLUE CROSS/BLUE SHIELD | Source: Ambulatory Visit | Attending: Internal Medicine | Admitting: Internal Medicine

## 2018-02-27 ENCOUNTER — Encounter: Payer: Self-pay | Admitting: Internal Medicine

## 2018-02-27 VITALS — BP 126/76 | HR 82 | Temp 98.2°F | Resp 16 | Ht 64.0 in | Wt 191.4 lb

## 2018-02-27 DIAGNOSIS — R002 Palpitations: Secondary | ICD-10-CM

## 2018-02-27 DIAGNOSIS — R0602 Shortness of breath: Secondary | ICD-10-CM | POA: Diagnosis not present

## 2018-02-27 DIAGNOSIS — R5383 Other fatigue: Secondary | ICD-10-CM | POA: Diagnosis not present

## 2018-02-27 NOTE — Patient Instructions (Signed)
GO TO THE LAB : Get the blood work     GO TO THE FRONT DESK Schedule your next appointment for a checkup with your primary doctor in 4 to 6 weeks   STOP BY THE FIRST FLOOR:  get the XR   If you have any unusual or severe symptoms: Call the office or 911.

## 2018-02-27 NOTE — Progress Notes (Signed)
Pre visit review using our clinic review tool, if applicable. No additional management support is needed unless otherwise documented below in the visit note. 

## 2018-02-27 NOTE — Progress Notes (Signed)
Subjective:    Patient ID: Jaclyn Tucker, female    DOB: 1976-01-01, 42 y.o.   MRN: [id number]  DOS:  02/27/2018 Type of visit - description : Acute visit Not feeling well for the last 2 weeks, she has a hard time describing her symptoms. She feels tired, different from the fatigue from fibromyalgia. " the best way I can describe it is I feel blah" She also reports she is not short of breath but "I do not think I am breathing like I should".   Review of Systems Denies fever chills.  No chest pain Occasional palpitations and DOE when she overexerts herself. No lower extremity edema, no recent airplane trip or prolonged car trip. Denies any recent URI symptoms.  No history of asthma, has only occasional cough in the mornings. Stress is at baseline and she feels "okay" emotionally. Some snoring   Past Medical History:  Diagnosis Date  . Anxiety   . Depression   . Fibromyalgia 06/2014  . GERD (gastroesophageal reflux disease)   . IBS (irritable bowel syndrome) 11/04/2014  . Migraine   . Vitamin B12 deficiency     Past Surgical History:  Procedure Laterality Date  . g2 p2      Social History   Socioeconomic History  . Marital status: Married    Spouse name: Nicole Kindred  . Number of children: 2  . Years of education: Not on file  . Highest education level: 12th grade  Occupational History  . Occupation: Therapist, art  Social Needs  . Financial resource strain: Not on file  . Food insecurity:    Worry: Not on file    Inability: Not on file  . Transportation needs:    Medical: Not on file    Non-medical: Not on file  Tobacco Use  . Smoking status: Former Smoker    Types: Cigarettes    Last attempt to quit: 04/13/2014    Years since quitting: 3.8  . Smokeless tobacco: Never Used  Substance and Sexual Activity  . Alcohol use: Yes    Alcohol/week: 0.0 standard drinks    Comment: 1-2 per month  . Drug use: No  . Sexual activity: Yes    Birth control/protection:  None  Lifestyle  . Physical activity:    Days per week: Not on file    Minutes per session: Not on file  . Stress: Not on file  Relationships  . Social connections:    Talks on phone: Not on file    Gets together: Not on file    Attends religious service: Not on file    Active member of club or organization: Not on file    Attends meetings of clubs or organizations: Not on file    Relationship status: Not on file  . Intimate partner violence:    Fear of current or ex partner: Not on file    Emotionally abused: Not on file    Physically abused: Not on file    Forced sexual activity: Not on file  Other Topics Concern  . Not on file  Social History Narrative   Married   2 sons 1999 and 2008   Customer relations Physiological scientist since 2006   No caffeine   11/04/2014   Pt married, lives with husband and 2 children in a one level home. Drinks one cup of coffee a day, maybe 3 sodas a week. Works out at [employer] a week.      Allergies as of  02/27/2018      Reactions   Bee Venom Swelling   Cyclobenzaprine Swelling   Moxifloxacin Anaphylaxis, Palpitations   REACTION: nausea REACTION: nausea REACTION: nausea   Moxifloxacin Hcl In Nacl Anaphylaxis   Penicillins Other (See Comments)   REACTION: swelling REACTION: swelling Parents told her she couldn't take it   Quinolones Anaphylaxis, Palpitations   REACTION: nausea   Shellfish Allergy Rash   Sulfate Rash   Sumatriptan Succinate    Other reaction(s): Dizziness, Other REACTION: nausea, dizziness, panic symptoms REACTION: nausea, dizziness, panic symptoms   Celexa [citalopram Hydrobromide] Other (See Comments)   Panic attacks   Duloxetine Other (See Comments)   Panic attacks   Escitalopram Other (See Comments)   Panic attacks   Ranitidine Hcl    Other reaction(s): Other (See Comments) uknown   Sumatriptan Succinate    REACTION: nausea, dizziness, panic symptoms      Medication List       Accurate as of February 27, 2018 11:59 PM. Always use your most recent med list.        ALPRAZolam 0.5 MG tablet Commonly known as:  XANAX Take 0.5 mg by mouth daily as needed.   cyanocobalamin 1000 MCG/ML injection Commonly known as:  (VITAMIN B-12) Inject 1 mL (1,000 mcg total) into the muscle every 30 (thirty) days.   EPINEPHrine 0.3 mg/0.3 mL Soaj injection Commonly known as:  EPI-PEN As needed.   gabapentin 100 MG capsule Commonly known as:  NEURONTIN TAKE 1 CAPSULE BY MOUTH THREE TIMES DAILY   methocarbamol 750 MG tablet Commonly known as:  ROBAXIN Take 1 tablet (750 mg total) by mouth 3 (three) times daily.   pantoprazole 40 MG tablet Commonly known as:  PROTONIX Take 1 tablet (40 mg total) by mouth daily.   sertraline 50 MG tablet Commonly known as:  ZOLOFT Take 75 mg by mouth daily.   SYRINGE 3CC/20GX1-1/2" 20G X 1-1/2" 3 ML Misc Use to inject b12   valACYclovir 1000 MG tablet Commonly known as:  VALTREX As needed.           Objective:   Physical Exam BP 126/76 (BP Location: Left Arm, Patient Position: Sitting, Cuff Size: Normal)   Pulse 82   Temp 98.2 F (36.8 C) (Oral)   Resp 16   Ht 5\' 4"  (1.626 m)   Wt 191 lb 6 oz (86.8 kg)   LMP 02/21/2018 (Exact Date)   SpO2 98%   BMI 32.85 kg/m  General:   Well developed, NAD, BMI noted. HEENT:  Normocephalic . Face symmetric, atraumatic Neck: No thyromegaly Lungs:  CTA B Normal respiratory effort, no intercostal retractions, no accessory muscle use. Heart: RRR,  no murmur.  No pretibial edema bilaterally  Calves symmetric Skin: Not pale. Not jaundice Neurologic:  alert & oriented X3.  Speech normal, gait appropriate for age and unassisted Psych--  Cognition and judgment appear intact.  Cooperative with normal attention span and concentration.  Behavior appropriate. No anxious or depressed appearing.      Assessment & Plan:    42 year old female, PMH includes anxiety, depression, fibromyalgia, IBS, (birth  control: Husband's vasectomy) presents with  Fatigue, atypical palpitations and shortness of breath: See sxs description above,  EKG  showed NSR and no acute changes. For completeness we will get CMP, CBC and chest x-ray. If she has any unusual severe symptoms needs to call otherwise follow-up with PCP in 4 to 6 weeks.

## 2018-02-28 ENCOUNTER — Other Ambulatory Visit (INDEPENDENT_AMBULATORY_CARE_PROVIDER_SITE_OTHER): Payer: BLUE CROSS/BLUE SHIELD

## 2018-02-28 ENCOUNTER — Encounter: Payer: Self-pay | Admitting: Internal Medicine

## 2018-02-28 DIAGNOSIS — R002 Palpitations: Secondary | ICD-10-CM | POA: Diagnosis not present

## 2018-02-28 LAB — COMPREHENSIVE METABOLIC PANEL
ALT: 22 U/L (ref 0–35)
AST: 16 U/L (ref 0–37)
Albumin: 4 g/dL (ref 3.5–5.2)
Alkaline Phosphatase: 67 U/L (ref 39–117)
BILIRUBIN TOTAL: 0.3 mg/dL (ref 0.2–1.2)
BUN: 13 mg/dL (ref 6–23)
CHLORIDE: 107 meq/L (ref 96–112)
CO2: 26 meq/L (ref 19–32)
CREATININE: 0.84 mg/dL (ref 0.40–1.20)
Calcium: 9 mg/dL (ref 8.4–10.5)
GFR: 79.8 mL/min (ref >60.00)
Glucose, Bld: 89 mg/dL (ref 70–99)
Potassium: 4.6 mEq/L (ref 3.5–5.1)
SODIUM: 141 meq/L (ref 135–145)
Total Protein: 6.9 g/dL (ref 6.0–8.3)

## 2018-02-28 LAB — CBC WITH DIFFERENTIAL/PLATELET
BASOS ABS: 0 10*3/uL (ref 0.0–0.1)
BASOS PCT: 0.5 % (ref 0.0–3.0)
EOS ABS: 0.1 10*3/uL (ref 0.0–0.7)
Eosinophils Relative: 1.7 % (ref 0.0–5.0)
HCT: 40.2 % (ref 36.0–46.0)
Hemoglobin: 13.4 g/dL (ref 12.0–15.0)
LYMPHS ABS: 2.3 10*3/uL (ref 0.7–4.0)
Lymphocytes Relative: 31 % (ref 12.0–46.0)
MCHC: 33.5 g/dL (ref 30.0–36.0)
MCV: 86.9 fl (ref 78.0–100.0)
MONO ABS: 0.5 10*3/uL (ref 0.1–1.0)
Monocytes Relative: 6.1 % (ref 3.0–12.0)
NEUTROS ABS: 4.5 10*3/uL (ref 1.4–7.7)
NEUTROS PCT: 60.7 % (ref 43.0–77.0)
PLATELETS: 326 10*3/uL (ref 150.0–400.0)
RBC: 4.62 Mil/uL (ref 3.87–5.11)
RDW: 13.2 % (ref 11.5–15.5)
WBC: 7.5 10*3/uL (ref 4.0–10.5)

## 2018-02-28 NOTE — Addendum Note (Signed)
Addended by: Caffie Pinto on: 02/28/2018 07:25 AM   Modules accepted: Orders

## 2018-03-01 ENCOUNTER — Encounter: Payer: Self-pay | Admitting: Family

## 2018-03-14 ENCOUNTER — Encounter: Payer: Self-pay | Admitting: Family

## 2018-03-19 ENCOUNTER — Ambulatory Visit (INDEPENDENT_AMBULATORY_CARE_PROVIDER_SITE_OTHER): Payer: BLUE CROSS/BLUE SHIELD | Admitting: Family

## 2018-03-19 ENCOUNTER — Encounter: Payer: Self-pay | Admitting: Family

## 2018-03-19 VITALS — BP 132/85 | HR 90 | Temp 98.5°F | Resp 16 | Ht 64.0 in | Wt 189.0 lb

## 2018-03-19 DIAGNOSIS — R1012 Left upper quadrant pain: Secondary | ICD-10-CM | POA: Diagnosis not present

## 2018-03-19 DIAGNOSIS — Z0000 Encounter for general adult medical examination without abnormal findings: Secondary | ICD-10-CM

## 2018-03-19 LAB — URINALYSIS, ROUTINE W REFLEX MICROSCOPIC
BILIRUBIN URINE: NEGATIVE
KETONES UR: NEGATIVE
Leukocytes, UA: NEGATIVE
Nitrite: NEGATIVE
Specific Gravity, Urine: 1.01 (ref 1.000–1.030)
Total Protein, Urine: NEGATIVE
UROBILINOGEN UA: 1 (ref 0.0–1.0)
Urine Glucose: NEGATIVE
pH: 7 (ref 5.0–8.0)

## 2018-03-19 LAB — TSH: TSH: 1.98 u[IU]/mL (ref 0.35–4.50)

## 2018-03-19 LAB — LIPASE: Lipase: 19 U/L (ref 11.0–59.0)

## 2018-03-19 LAB — HEPATIC FUNCTION PANEL
ALBUMIN: 4.2 g/dL (ref 3.5–5.2)
ALT: 17 U/L (ref 0–35)
AST: 17 U/L (ref 0–37)
Alkaline Phosphatase: 70 U/L (ref 39–117)
Bilirubin, Direct: 0.1 mg/dL (ref 0.0–0.3)
Total Bilirubin: 0.5 mg/dL (ref 0.2–1.2)
Total Protein: 7.2 g/dL (ref 6.0–8.3)

## 2018-03-19 LAB — LIPID PANEL
CHOLESTEROL: 186 mg/dL (ref 0–200)
HDL: 39.7 mg/dL (ref >39.00)
LDL Cholesterol: 119 mg/dL — ABNORMAL HIGH (ref 0–99)
NonHDL: 146.32
Total CHOL/HDL Ratio: 5
Triglycerides: 138 mg/dL (ref 0.0–149.0)
VLDL: 27.6 mg/dL (ref 0.0–40.0)

## 2018-03-19 MED ORDER — CYANOCOBALAMIN 1000 MCG/ML IJ SOLN: 1000.0000 ug | INTRAMUSCULAR | 4 refills | Status: DC

## 2018-03-19 MED ORDER — GABAPENTIN 100 MG PO CAPS: 100.0000 mg | ORAL_CAPSULE | Freq: Three times a day (TID) | ORAL | 5 refills | Status: DC

## 2018-03-19 NOTE — Patient Instructions (Signed)
Please complete lab work prior to leaving.  Continue to work on [employer], regular exercise and weight loss.  Continue protonix.

## 2018-03-19 NOTE — Progress Notes (Signed)
Subjective:    Patient ID: Jaclyn Tucker, female    DOB: 24-Sep-1975, 43 y.o.   MRN: [id number]  HPI  Patient is a 43 yr old female who presents for cpx.  Immunizations: tetanus 2013, had flu shot Diet: reports that her diet is improving. Started weight watchers yesterday Exercise:  Feels worn out, walks some Pap Smear: 07/30/17 Mammogram: due Vision:  Up to date Dental: up to date Wt Readings from Last 3 Encounters:  03/19/18 189 lb (85.7 kg)  02/27/18 191 lb 6 oz (86.8 kg)  12/12/17 189 lb (85.7 kg)   She does note some LUQ discomfort at times.     Review of Systems  Constitutional: Negative for unexpected weight change.  HENT: Negative for hearing loss and rhinorrhea.   Eyes: Negative for visual disturbance.  Respiratory: Negative for cough and shortness of breath.   Cardiovascular: Negative for leg swelling.  Gastrointestinal:       Reports IBS, alternates diarrhea/constipation  Genitourinary: Negative for dysuria, frequency and hematuria.  Musculoskeletal: Positive for myalgias. Negative for arthralgias.  Skin: Negative for rash.  Neurological: Positive for headaches.  Hematological: Negative for adenopathy.  Psychiatric/Behavioral:       Denies depression, reports anxiety is controlled.    Past Medical History:  Diagnosis Date  . Anxiety   . Depression   . Fibromyalgia 06/2014  . GERD (gastroesophageal reflux disease)   . IBS (irritable bowel syndrome) 11/04/2014  . Migraine   . Vitamin B12 deficiency      Social History   Socioeconomic History  . Marital status: Married    Spouse name: Nicole Kindred  . Number of children: 2  . Years of education: Not on file  . Highest education level: 12th grade  Occupational History  . Occupation: Therapist, art  Social Needs  . Financial resource strain: Not on file  . Food insecurity:    Worry: Not on file    Inability: Not on file  . Transportation needs:    Medical: Not on file    Non-medical: Not on file    Tobacco Use  . Smoking status: Former Smoker    Types: Cigarettes    Last attempt to quit: 04/13/2014    Years since quitting: 3.9  . Smokeless tobacco: Never Used  Substance and Sexual Activity  . Alcohol use: Yes    Alcohol/week: 0.0 standard drinks    Comment: 1-2 per month  . Drug use: No  . Sexual activity: Yes    Birth control/protection: None  Lifestyle  . Physical activity:    Days per week: Not on file    Minutes per session: Not on file  . Stress: Not on file  Relationships  . Social connections:    Talks on phone: Not on file    Gets together: Not on file    Attends religious service: Not on file    Active member of club or organization: Not on file    Attends meetings of clubs or organizations: Not on file    Relationship status: Not on file  . Intimate partner violence:    Fear of current or ex partner: Not on file    Emotionally abused: Not on file    Physically abused: Not on file    Forced sexual activity: Not on file  Other Topics Concern  . Not on file  Social History Narrative   Married   2 sons 1999 and 2008   Customer relations Physiological scientist since 2006  No caffeine   11/04/2014   Pt married, lives with husband and 2 children in a one level home. Drinks one cup of coffee a day, maybe 3 sodas a week. Works out at [employer] a week.    Past Surgical History:  Procedure Laterality Date  . g2 p2      Family History  Problem Relation Age of Onset  . Diabetes Mother   . Hypertension Mother   . Cerebrovascular Accident Mother   . Hypertension Father   . Crohn's disease Father   . Colon cancer Paternal Grandmother   . Breast cancer Paternal Grandmother   . Crohn's disease Brother   . Anxiety disorder Sister   . Lung disease Paternal Grandfather   . Alzheimer's disease Other     Allergies  Allergen Reactions  . Bee Venom Swelling  . Cyclobenzaprine Swelling  . Moxifloxacin Anaphylaxis and Palpitations    REACTION: nausea REACTION:  nausea REACTION: nausea  . Moxifloxacin Hcl In Nacl Anaphylaxis  . Penicillins Other (See Comments)    REACTION: swelling REACTION: swelling Parents told her she couldn't take it  . Quinolones Anaphylaxis and Palpitations    REACTION: nausea  . Shellfish Allergy Rash  . Sulfate Rash  . Sumatriptan Succinate     Other reaction(s): Dizziness, Other REACTION: nausea, dizziness, panic symptoms REACTION: nausea, dizziness, panic symptoms  . Celexa [Citalopram Hydrobromide] Other (See Comments)    Panic attacks  . Duloxetine Other (See Comments)    Panic attacks  . Escitalopram Other (See Comments)    Panic attacks  . Ranitidine Hcl     Other reaction(s): Other (See Comments) uknown  . Sumatriptan Succinate     REACTION: nausea, dizziness, panic symptoms    Current Outpatient Medications on File Prior to Visit  Medication Sig Dispense Refill  . ALPRAZolam (XANAX) 0.5 MG tablet Take 0.5 mg by mouth daily as needed.     . cyanocobalamin (,VITAMIN B-12,) 1000 MCG/ML injection Inject 1 mL (1,000 mcg total) into the muscle every 30 (thirty) days. 3 mL 4  . EPINEPHrine 0.3 mg/0.3 mL IJ SOAJ injection As needed.    . gabapentin (NEURONTIN) 100 MG capsule TAKE 1 CAPSULE BY MOUTH THREE TIMES DAILY 90 capsule 0  . methocarbamol (ROBAXIN) 750 MG tablet Take 1 tablet (750 mg total) by mouth 3 (three) times daily. (Patient taking differently: Take 750 mg by mouth 3 (three) times daily as needed. ) 45 tablet 0  . pantoprazole (PROTONIX) 40 MG tablet Take 1 tablet (40 mg total) by mouth daily. 30 tablet 3  . sertraline (ZOLOFT) 50 MG tablet Take 75 mg by mouth daily.     . Syringe/Needle, Disp, (SYRINGE 3CC/20GX1-1/2") 20G X 1-1/2" 3 ML MISC Use to inject b12 12 each 0  . valACYclovir (VALTREX) 1000 MG tablet As needed.     No current facility-administered medications on file prior to visit.     BP 132/85 (BP Location: Right Arm, Patient Position: Sitting, Cuff Size: Small)   Pulse 90   Temp  98.5 F (36.9 C) (Oral)   Resp 16   Ht 5\' 4"  (1.626 m)   Wt 189 lb (85.7 kg)   LMP 02/21/2018 (Exact Date)   SpO2 100%   BMI 32.44 kg/m       Objective:   Physical Exam  Physical Exam  Constitutional: She is oriented to person, place, and time. She appears well-developed and well-nourished. No distress.  HENT:  Head: Normocephalic and atraumatic.  Right  Ear: Tympanic membrane and ear canal normal.  Left Ear: Tympanic membrane and ear canal normal.  Mouth/Throat: Oropharynx is clear and moist.  Eyes: Pupils are equal, round, and reactive to light. No scleral icterus.  Neck: Normal range of motion. No thyromegaly present.  Cardiovascular: Normal rate and regular rhythm.   No murmur heard. Pulmonary/Chest: Effort normal and breath sounds normal. No respiratory distress. He has no wheezes. She has no rales. She exhibits no tenderness.  Abdominal: Soft. Bowel sounds are normal. She exhibits no distension and no mass. There is no tenderness. There is no rebound and no guarding.  Musculoskeletal: She exhibits no edema.  Lymphadenopathy:    She has no cervical adenopathy.  Neurological: She is alert and oriented to person, place, and time. She has normal patellar reflexes. She exhibits normal muscle tone. Coordination normal.  Skin: Skin is warm and dry.  Psychiatric: She has a normal mood and affect. Her behavior is normal. Judgment and thought content normal.  Breast/Pelvic: deferred        Assessment & Plan:   Preventative care- discussed healthy diet, exercise and weight loss. Immunizations reviewed and up to date. Refer of mammogram. Pap up to date.  LUQ discomfort- ? Constipation. Check LFT/lipase. Discussed increasing water intake/fiber and calling if symptoms worsen or fail to improve.      Assessment & Plan:

## 2018-03-23 ENCOUNTER — Ambulatory Visit (HOSPITAL_BASED_OUTPATIENT_CLINIC_OR_DEPARTMENT_OTHER)
Admission: RE | Admit: 2018-03-23 | Discharge: 2018-03-23 | Disposition: A | Discharge status: Home / Self Care | Payer: BLUE CROSS/BLUE SHIELD | Source: Ambulatory Visit | Attending: Family | Admitting: Family

## 2018-03-23 DIAGNOSIS — Z1231 Encounter for screening mammogram for malignant neoplasm of breast: Secondary | ICD-10-CM | POA: Diagnosis not present

## 2018-03-23 DIAGNOSIS — Z0000 Encounter for general adult medical examination without abnormal findings: Secondary | ICD-10-CM | POA: Diagnosis not present

## 2018-04-01 ENCOUNTER — Encounter: Payer: BLUE CROSS/BLUE SHIELD | Admitting: Family

## 2018-04-01 LAB — HM MAMMOGRAPHY

## 2018-04-20 ENCOUNTER — Other Ambulatory Visit: Payer: Self-pay | Admitting: Family

## 2018-05-09 DIAGNOSIS — F410 Panic disorder [episodic paroxysmal anxiety] without agoraphobia: Secondary | ICD-10-CM | POA: Diagnosis not present

## 2018-05-09 DIAGNOSIS — F411 Generalized anxiety disorder: Secondary | ICD-10-CM | POA: Diagnosis not present

## 2018-06-02 ENCOUNTER — Telehealth: Payer: BLUE CROSS/BLUE SHIELD | Admitting: Family

## 2018-06-02 DIAGNOSIS — R399 Unspecified symptoms and signs involving the genitourinary system: Secondary | ICD-10-CM | POA: Diagnosis not present

## 2018-06-02 MED ORDER — NITROFURANTOIN MONOHYD MACRO 100 MG PO CAPS: 100.0000 mg | ORAL_CAPSULE | Freq: Two times a day (BID) | ORAL | 0 refills | Status: DC

## 2018-06-02 NOTE — Progress Notes (Signed)
We are sorry that you are not feeling well.  Here is how we plan to help!  Based on what you shared with me it looks like you most likely have a simple urinary tract infection.  A UTI (Urinary Tract Infection) is a bacterial infection of the bladder.  Most cases of urinary tract infections are simple to treat but a key part of your care is to encourage you to drink plenty of fluids and watch your symptoms carefully.  I have prescribed MacroBid 100 mg twice a day for 5 days.  Your symptoms should gradually improve. Call us if the burning in your urine worsens, you develop worsening fever, back pain or pelvic pain or if your symptoms do not resolve after completing the antibiotic.  Approximately 5 minutes was spent documenting and reviewing patient's chart.   Urinary tract infections can be prevented by drinking plenty of water to keep your body hydrated.  Also be sure when you wipe, wipe from front to back and don't hold it in!  If possible, empty your bladder every 4 hours.  Your e-visit answers were reviewed by a board certified advanced clinical practitioner to complete your personal care plan.  Depending on the condition, your plan could have included both over the counter or prescription medications.  If there is a problem please reply  once you have received a response from your provider.  Your safety is important to us.  If you have drug allergies check your prescription carefully.    You can use MyChart to ask questions about today's visit, request a non-urgent call back, or ask for a work or school excuse for 24 hours related to this e-Visit. If it has been greater than 24 hours you will need to follow up with your provider, or enter a new e-Visit to address those concerns.   You will get an e-mail in the next two days asking about your experience.  I hope that your e-visit has been valuable and will speed your recovery. Thank you for using e-visits.    

## 2018-07-22 DIAGNOSIS — Z03818 Encounter for observation for suspected exposure to other biological agents ruled out: Secondary | ICD-10-CM | POA: Diagnosis not present

## 2018-08-12 DIAGNOSIS — B070 Plantar wart: Secondary | ICD-10-CM | POA: Diagnosis not present

## 2018-08-12 DIAGNOSIS — S90852A Superficial foreign body, left foot, initial encounter: Secondary | ICD-10-CM | POA: Diagnosis not present

## 2018-08-12 DIAGNOSIS — L02612 Cutaneous abscess of left foot: Secondary | ICD-10-CM | POA: Diagnosis not present

## 2018-08-15 DIAGNOSIS — F410 Panic disorder [episodic paroxysmal anxiety] without agoraphobia: Secondary | ICD-10-CM | POA: Diagnosis not present

## 2018-08-15 DIAGNOSIS — F411 Generalized anxiety disorder: Secondary | ICD-10-CM | POA: Diagnosis not present

## 2018-08-19 ENCOUNTER — Encounter: Payer: Self-pay | Admitting: Family

## 2018-08-19 NOTE — Telephone Encounter (Signed)
Was scheduled for tomorrow at 4:20

## 2018-08-20 ENCOUNTER — Telehealth (INDEPENDENT_AMBULATORY_CARE_PROVIDER_SITE_OTHER): Payer: BC Managed Care – PPO | Admitting: Family

## 2018-08-20 ENCOUNTER — Other Ambulatory Visit: Payer: Self-pay

## 2018-08-20 DIAGNOSIS — R399 Unspecified symptoms and signs involving the genitourinary system: Secondary | ICD-10-CM

## 2018-08-20 DIAGNOSIS — N3000 Acute cystitis without hematuria: Secondary | ICD-10-CM | POA: Diagnosis not present

## 2018-08-20 DIAGNOSIS — S90852D Superficial foreign body, left foot, subsequent encounter: Secondary | ICD-10-CM | POA: Diagnosis not present

## 2018-08-20 MED ORDER — NITROFURANTOIN MONOHYD MACRO 100 MG PO CAPS: 100.0000 mg | ORAL_CAPSULE | Freq: Two times a day (BID) | ORAL | 0 refills | Status: DC

## 2018-08-20 NOTE — Progress Notes (Signed)
Virtual Visit via Video Note  I connected with Jaclyn Tucker on 08/20/18 at  4:20 PM EDT by a video enabled telemedicine application and verified that I am speaking with the correct person using two identifiers. This visit type was conducted due to national recommendations for restrictions regarding the COVID-19 Pandemic (e.g. social distancing).  This format is felt to be most appropriate for this patient at this time.   I discussed the limitations of evaluation and management by telemedicine and the availability of in person appointments. The patient expressed understanding and agreed to proceed.  Only the patient and myself were on today's video visit. The patient was at home and I was in my office at the time of today's visit.   History of Present Illness:   reports mild pelvic pain for the last several weeks. Did get her period.  Notes that pain is "achey" like menstrual cramps. Pain is in the middle.  Reports previous hx of ovarian cyst which was "more on my side. Reports LMP was 5/26- 6/2. Reports this period was "a little lighter than it had been." denies fever.    Feels "like I have to go more" frequently. Sometimes feels like  Denies dysuria/frequency.  Husband had vasectomy.  She denies vaginal discharge or dyspareunia. Denies any concerns for STI's.   Past Medical History:  Diagnosis Date  . Anxiety   . Depression   . Fibromyalgia 06/2014  . GERD (gastroesophageal reflux disease)   . IBS (irritable bowel syndrome) 11/04/2014  . Migraine   . Vitamin B12 deficiency      Social History   Socioeconomic History  . Marital status: Married    Spouse name: Nicole Kindred  . Number of children: 2  . Years of education: Not on file  . Highest education level: 12th grade  Occupational History  . Occupation: Therapist, art  Social Needs  . Financial resource strain: Not on file  . Food insecurity:    Worry: Not on file    Inability: Not on file  . Transportation needs:    Medical:  Not on file    Non-medical: Not on file  Tobacco Use  . Smoking status: Former Smoker    Types: Cigarettes    Last attempt to quit: 04/13/2014    Years since quitting: 4.3  . Smokeless tobacco: Never Used  Substance and Sexual Activity  . Alcohol use: Yes    Alcohol/week: 0.0 standard drinks    Comment: 1-2 per month  . Drug use: No  . Sexual activity: Yes    Birth control/protection: None  Lifestyle  . Physical activity:    Days per week: Not on file    Minutes per session: Not on file  . Stress: Not on file  Relationships  . Social connections:    Talks on phone: Not on file    Gets together: Not on file    Attends religious service: Not on file    Active member of club or organization: Not on file    Attends meetings of clubs or organizations: Not on file    Relationship status: Not on file  . Intimate partner violence:    Fear of current or ex partner: Not on file    Emotionally abused: Not on file    Physically abused: Not on file    Forced sexual activity: Not on file  Other Topics Concern  . Not on file  Social History Narrative   Married   2 sons 1999 and 2008  Customer relations Physiological scientist since 2006   No caffeine   11/04/2014   Pt married, lives with husband and 2 children in a one level home. Drinks one cup of coffee a day, maybe 3 sodas a week. Works out at [employer] a week.    Past Surgical History:  Procedure Laterality Date  . g2 p2      Family History  Problem Relation Age of Onset  . Diabetes Mother   . Hypertension Mother   . Cerebrovascular Accident Mother   . Hypertension Father   . Crohn's disease Father   . Colon cancer Paternal Grandmother   . Breast cancer Paternal Grandmother   . Crohn's disease Brother   . Anxiety disorder Sister   . Lung disease Paternal Grandfather   . Alzheimer's disease Other     Allergies  Allergen Reactions  . Bee Venom Swelling  . Cyclobenzaprine Swelling  . Moxifloxacin Anaphylaxis and  Palpitations    REACTION: nausea REACTION: nausea REACTION: nausea  . Moxifloxacin Hcl In Nacl Anaphylaxis  . Penicillins Other (See Comments)    REACTION: swelling REACTION: swelling Parents told her she couldn't take it  . Quinolones Anaphylaxis and Palpitations    REACTION: nausea  . Shellfish Allergy Rash  . Sulfate Rash  . Sumatriptan Succinate     Other reaction(s): Dizziness, Other REACTION: nausea, dizziness, panic symptoms REACTION: nausea, dizziness, panic symptoms  . Celexa [Citalopram Hydrobromide] Other (See Comments)    Panic attacks  . Duloxetine Other (See Comments)    Panic attacks  . Escitalopram Other (See Comments)    Panic attacks  . Ranitidine Hcl     Other reaction(s): Other (See Comments) uknown  . Sumatriptan Succinate     REACTION: nausea, dizziness, panic symptoms    Current Outpatient Medications on File Prior to Visit  Medication Sig Dispense Refill  . ALPRAZolam (XANAX) 0.5 MG tablet Take 0.5 mg by mouth daily as needed.     . cyanocobalamin (,VITAMIN B-12,) 1000 MCG/ML injection Inject 1 mL (1,000 mcg total) into the muscle every 30 (thirty) days. 3 mL 4  . EPINEPHrine 0.3 mg/0.3 mL IJ SOAJ injection As needed.    . gabapentin (NEURONTIN) 100 MG capsule Take 1 capsule (100 mg total) by mouth 3 (three) times daily. 90 capsule 5  . methocarbamol (ROBAXIN) 750 MG tablet Take 1 tablet (750 mg total) by mouth 3 (three) times daily. (Patient taking differently: Take 750 mg by mouth 3 (three) times daily as needed. ) 45 tablet 0  . pantoprazole (PROTONIX) 40 MG tablet Take 1 tablet (40 mg total) by mouth daily. 30 tablet 3  . sertraline (ZOLOFT) 50 MG tablet Take 75 mg by mouth daily.     . Syringe/Needle, Disp, (SYRINGE 3CC/20GX1-1/2") 20G X 1-1/2" 3 ML MISC Use to inject b12 12 each 0  . valACYclovir (VALTREX) 1000 MG tablet As needed.     No current facility-administered medications on file prior to visit.     There were no vitals taken for this  visit.    Observations/Objective:    Gen: Awake, alert, no acute distress Resp: Breathing is even and non-labored Psych: calm/pleasant demeanor Neuro: Alert and Oriented x 3, + facial symmetry, speech is clear.     Assessment and Plan:   UTI- suprapubic discomfort, urinary frequency.  Most likely cause is UTI. She has tolerated macrobid in the past. Will rx with macrobid and advised pt to call if symptoms worsen or if not improved  in 2-3 days and we will need to bring her in for a face to face visit/exam. Pt verbalizes understanding.    Follow Up Instructions:    I discussed the assessment and treatment plan with the patient. The patient was provided an opportunity to ask questions and all were answered. The patient agreed with the plan and demonstrated an understanding of the instructions.   The patient was advised to call back or seek an in-person evaluation if the symptoms worsen or if the condition fails to improve as anticipated.    Nance Pear, NP

## 2018-08-28 DIAGNOSIS — R102 Pelvic and perineal pain: Secondary | ICD-10-CM | POA: Diagnosis not present

## 2018-09-12 DIAGNOSIS — B070 Plantar wart: Secondary | ICD-10-CM | POA: Diagnosis not present

## 2018-09-16 ENCOUNTER — Other Ambulatory Visit: Payer: Self-pay | Admitting: Family

## 2018-10-08 DIAGNOSIS — B070 Plantar wart: Secondary | ICD-10-CM | POA: Diagnosis not present

## 2018-11-04 DIAGNOSIS — B070 Plantar wart: Secondary | ICD-10-CM | POA: Diagnosis not present

## 2018-11-14 DIAGNOSIS — F410 Panic disorder [episodic paroxysmal anxiety] without agoraphobia: Secondary | ICD-10-CM | POA: Diagnosis not present

## 2018-11-14 DIAGNOSIS — F411 Generalized anxiety disorder: Secondary | ICD-10-CM | POA: Diagnosis not present

## 2018-11-19 DIAGNOSIS — B070 Plantar wart: Secondary | ICD-10-CM | POA: Diagnosis not present

## 2018-12-10 DIAGNOSIS — B070 Plantar wart: Secondary | ICD-10-CM | POA: Diagnosis not present

## 2019-02-11 ENCOUNTER — Other Ambulatory Visit: Payer: Self-pay

## 2019-02-11 ENCOUNTER — Telehealth (INDEPENDENT_AMBULATORY_CARE_PROVIDER_SITE_OTHER): Payer: BC Managed Care – PPO | Admitting: Family

## 2019-02-11 ENCOUNTER — Encounter: Payer: Self-pay | Admitting: Family

## 2019-02-11 VITALS — BP 123/78 | HR 92 | Ht 64.0 in | Wt 189.0 lb

## 2019-02-11 DIAGNOSIS — R1012 Left upper quadrant pain: Secondary | ICD-10-CM

## 2019-02-11 DIAGNOSIS — R109 Unspecified abdominal pain: Secondary | ICD-10-CM | POA: Diagnosis not present

## 2019-02-11 LAB — COMPREHENSIVE METABOLIC PANEL
ALT: 20 U/L (ref 0–35)
AST: 15 U/L (ref 0–37)
Albumin: 4.1 g/dL (ref 3.5–5.2)
Alkaline Phosphatase: 70 U/L (ref 39–117)
BUN: 11 mg/dL (ref 6–23)
CO2: 24 mEq/L (ref 19–32)
Calcium: 8.9 mg/dL (ref 8.4–10.5)
Chloride: 106 mEq/L (ref 96–112)
Creatinine, Ser: 0.78 mg/dL (ref 0.40–1.20)
GFR: 81.39 mL/min (ref >60.00)
Glucose, Bld: 93 mg/dL (ref 70–99)
Potassium: 4.8 mEq/L (ref 3.5–5.1)
Sodium: 138 mEq/L (ref 135–145)
Total Bilirubin: 0.5 mg/dL (ref 0.2–1.2)
Total Protein: 7.1 g/dL (ref 6.0–8.3)

## 2019-02-11 LAB — CBC WITH DIFFERENTIAL/PLATELET
Basophils Absolute: 0 10*3/uL (ref 0.0–0.1)
Basophils Relative: 0.7 % (ref 0.0–3.0)
Eosinophils Absolute: 0.1 10*3/uL (ref 0.0–0.7)
Eosinophils Relative: 1.4 % (ref 0.0–5.0)
HCT: 41.4 % (ref 36.0–46.0)
Hemoglobin: 13.9 g/dL (ref 12.0–15.0)
Lymphocytes Relative: 28.7 % (ref 12.0–46.0)
Lymphs Abs: 1.9 10*3/uL (ref 0.7–4.0)
MCHC: 33.4 g/dL (ref 30.0–36.0)
MCV: 87.2 fl (ref 78.0–100.0)
Monocytes Absolute: 0.4 10*3/uL (ref 0.1–1.0)
Monocytes Relative: 6.3 % (ref 3.0–12.0)
Neutro Abs: 4.1 10*3/uL (ref 1.4–7.7)
Neutrophils Relative %: 62.9 % (ref 43.0–77.0)
Platelets: 315 10*3/uL (ref 150.0–400.0)
RBC: 4.75 Mil/uL (ref 3.87–5.11)
RDW: 13.4 % (ref 11.5–15.5)
WBC: 6.5 10*3/uL (ref 4.0–10.5)

## 2019-02-11 LAB — POC URINALSYSI DIPSTICK (AUTOMATED)
Blood, UA: NEGATIVE
Glucose, UA: NEGATIVE
Ketones, UA: NEGATIVE
Leukocytes, UA: NEGATIVE
Nitrite, UA: NEGATIVE
Protein, UA: POSITIVE — AB
Spec Grav, UA: >=1.03 — AB (ref 1.010–1.025)
Urobilinogen, UA: 0.2 E.U./dL
pH, UA: 5.5 (ref 5.0–8.0)

## 2019-02-11 LAB — LIPASE: Lipase: 17 U/L (ref 11.0–59.0)

## 2019-02-11 LAB — POCT URINE PREGNANCY: Preg Test, Ur: NEGATIVE

## 2019-02-11 MED ORDER — MELOXICAM 7.5 MG PO TABS: 7.5000 mg | ORAL_TABLET | Freq: Every day | ORAL | 0 refills | Status: DC

## 2019-02-11 NOTE — Progress Notes (Signed)
Virtual Visit via Video Note  I connected with Jaclyn Tucker on 02/11/19 at  7:00 AM EST by a video enabled telemedicine application and verified that I am speaking with the correct person using two identifiers.  Location: Patient: home Provider: work   I discussed the limitations of evaluation and management by telemedicine and the availability of in person appointments. The patient expressed understanding and agreed to proceed.  History of Present Illness:  Patient is a 43 yr old female who presents today to discuss LUQ abdominal pain.   Reports that 1 month ago she developed intermittent diarrhea/constipation.  Reports that her stool is lighter "like a peanut butter color."  Pain is in the left upper abdomen.  She reports that the pain comes and goes.  Worse right after she moves her bowels.  "almost like a pulled muscle." present x 1 month.  Reports that if she presses on the area it does not hurt.    She reports some back pain but on the right side and this is baseline for her.  Denies fever, dysuria, frequency, blood, nausea or vomiting.  Denies current constipation.    Past Medical History:  Diagnosis Date  . Anxiety   . Depression   . Fibromyalgia 06/2014  . GERD (gastroesophageal reflux disease)   . IBS (irritable bowel syndrome) 11/04/2014  . Migraine   . Vitamin B12 deficiency      Social History   Socioeconomic History  . Marital status: Married    Spouse name: Nicole Kindred  . Number of children: 2  . Years of education: Not on file  . Highest education level: 12th grade  Occupational History  . Occupation: Therapist, art  Social Needs  . Financial resource strain: Not on file  . Food insecurity    Worry: Not on file    Inability: Not on file  . Transportation needs    Medical: Not on file    Non-medical: Not on file  Tobacco Use  . Smoking status: Former Smoker    Types: Cigarettes    Quit date: 04/13/2014    Years since quitting: 4.8  . Smokeless tobacco:  Never Used  Substance and Sexual Activity  . Alcohol use: Yes    Alcohol/week: 0.0 standard drinks    Comment: 1-2 per month  . Drug use: No  . Sexual activity: Yes    Birth control/protection: None  Lifestyle  . Physical activity    Days per week: Not on file    Minutes per session: Not on file  . Stress: Not on file  Relationships  . Social Herbalist on phone: Not on file    Gets together: Not on file    Attends religious service: Not on file    Active member of club or organization: Not on file    Attends meetings of clubs or organizations: Not on file    Relationship status: Not on file  . Intimate partner violence    Fear of current or ex partner: Not on file    Emotionally abused: Not on file    Physically abused: Not on file    Forced sexual activity: Not on file  Other Topics Concern  . Not on file  Social History Narrative   Married   2 sons 1999 and 2008   Customer relations Physiological scientist since 2006   No caffeine   11/04/2014   Pt married, lives with husband and 2 children in a one level home.  Drinks one cup of coffee a day, maybe 3 sodas a week. Works out at [employer] a week.    Past Surgical History:  Procedure Laterality Date  . g2 p2      Family History  Problem Relation Age of Onset  . Diabetes Mother   . Hypertension Mother   . Cerebrovascular Accident Mother   . Hypertension Father   . Crohn's disease Father   . Colon cancer Paternal Grandmother   . Breast cancer Paternal Grandmother   . Crohn's disease Brother   . Anxiety disorder Sister   . Lung disease Paternal Grandfather   . Alzheimer's disease Other     Allergies  Allergen Reactions  . Bee Venom Swelling  . Cyclobenzaprine Swelling  . Moxifloxacin Anaphylaxis and Palpitations    REACTION: nausea REACTION: nausea REACTION: nausea  . Moxifloxacin Hcl In Nacl Anaphylaxis  . Penicillins Other (See Comments)    REACTION: swelling REACTION: swelling Parents told her  she couldn't take it  . Quinolones Anaphylaxis and Palpitations    REACTION: nausea  . Shellfish Allergy Rash  . Sulfate Rash  . Sumatriptan Succinate     Other reaction(s): Dizziness, Other REACTION: nausea, dizziness, panic symptoms REACTION: nausea, dizziness, panic symptoms  . Celexa [Citalopram Hydrobromide] Other (See Comments)    Panic attacks  . Duloxetine Other (See Comments)    Panic attacks  . Escitalopram Other (See Comments)    Panic attacks  . Ranitidine Hcl     Other reaction(s): Other (See Comments) uknown  . Sumatriptan Succinate     REACTION: nausea, dizziness, panic symptoms    Current Outpatient Medications on File Prior to Visit  Medication Sig Dispense Refill  . ALPRAZolam (XANAX) 0.5 MG tablet Take 0.5 mg by mouth daily as needed.     . cyanocobalamin (,VITAMIN B-12,) 1000 MCG/ML injection Inject 1 mL (1,000 mcg total) into the muscle every 30 (thirty) days. 3 mL 4  . EPINEPHrine 0.3 mg/0.3 mL IJ SOAJ injection As needed.    . gabapentin (NEURONTIN) 100 MG capsule Take 1 capsule (100 mg total) by mouth 3 (three) times daily. 90 capsule 5  . methocarbamol (ROBAXIN) 750 MG tablet Take 1 tablet (750 mg total) by mouth 3 (three) times daily. (Patient taking differently: Take 750 mg by mouth 3 (three) times daily as needed. ) 45 tablet 0  . pantoprazole (PROTONIX) 40 MG tablet TAKE 1 TABLET(40 MG) BY MOUTH DAILY 30 tablet 3  . sertraline (ZOLOFT) 50 MG tablet Take 75 mg by mouth daily.     . Syringe/Needle, Disp, (SYRINGE 3CC/20GX1-1/2") 20G X 1-1/2" 3 ML MISC Use to inject b12 12 each 0  . valACYclovir (VALTREX) 1000 MG tablet As needed.     No current facility-administered medications on file prior to visit.     There were no vitals taken for this visit.   Observations/Objective:   Gen: Awake, alert, no acute distress Resp: Breathing is even and non-labored CV: S1/S2, RRR Psych: calm/pleasant demeanor Neuro: Alert and Oriented x 3, + facial symmetry,  speech is clear. Abd: neg CVAT bilaterally, + BS, abdomen is soft, non-distended. + tenderness to palpation just under the left lower anterior rib cage  Assessment and Plan:  LUQ pain- ? Musculoskeletal. Will obtain cmet, cbc, lipase to further evaluate. Trial of meloxicam. If symptoms worsen or if symptoms fail to improve will need abdominal imaging. Pt understands and is agreeable to plan.  She will send me an update on her progress  in 1 week.   Addendum: visit started out virtual but then pt was brought into the office for physical exam and lab work.   Follow Up Instructions:    I discussed the assessment and treatment plan with the patient. The patient was provided an opportunity to ask questions and all were answered. The patient agreed with the plan and demonstrated an understanding of the instructions.   The patient was advised to call back or seek an in-person evaluation if the symptoms worsen or if the condition fails to improve as anticipated.  Nance Pear, NP

## 2019-02-11 NOTE — Patient Instructions (Signed)
Please complete lab work prior to leaving. Begin meloxicam (anti-inflammatory) for your pain. Please give me an update in 1 week via mychart on your progress- contact me sooner if symptoms worse.

## 2019-02-13 ENCOUNTER — Encounter: Payer: Self-pay | Admitting: Family

## 2019-02-13 DIAGNOSIS — F410 Panic disorder [episodic paroxysmal anxiety] without agoraphobia: Secondary | ICD-10-CM | POA: Diagnosis not present

## 2019-02-13 DIAGNOSIS — F411 Generalized anxiety disorder: Secondary | ICD-10-CM | POA: Diagnosis not present

## 2019-02-13 DIAGNOSIS — R1012 Left upper quadrant pain: Secondary | ICD-10-CM

## 2019-02-24 ENCOUNTER — Ambulatory Visit: Payer: BC Managed Care – PPO | Admitting: Internal Medicine

## 2019-02-24 NOTE — Telephone Encounter (Signed)
Patient scheduled to see Dr .Larose Kells today

## 2019-02-24 NOTE — Addendum Note (Signed)
Addended by: Debbrah Alar on: 02/24/2019 01:35 PM   Modules accepted: Orders

## 2019-02-24 NOTE — Telephone Encounter (Signed)
Please contact pt to schedule a face to face appointment. Preferably today with another provider.  I think she needs some abdominal imaging.

## 2019-02-25 ENCOUNTER — Telehealth: Payer: Self-pay | Admitting: Family

## 2019-02-25 ENCOUNTER — Other Ambulatory Visit: Payer: Self-pay

## 2019-02-25 ENCOUNTER — Ambulatory Visit (HOSPITAL_BASED_OUTPATIENT_CLINIC_OR_DEPARTMENT_OTHER)
Admission: RE | Admit: 2019-02-25 | Discharge: 2019-02-25 | Disposition: A | Discharge status: Home / Self Care | Payer: BC Managed Care – PPO | Source: Ambulatory Visit | Attending: Family | Admitting: Family

## 2019-02-25 DIAGNOSIS — K828 Other specified diseases of gallbladder: Secondary | ICD-10-CM | POA: Diagnosis not present

## 2019-02-25 DIAGNOSIS — R109 Unspecified abdominal pain: Secondary | ICD-10-CM

## 2019-02-25 DIAGNOSIS — K760 Fatty (change of) liver, not elsewhere classified: Secondary | ICD-10-CM | POA: Diagnosis not present

## 2019-02-25 NOTE — Telephone Encounter (Signed)
I tried 3 times to get through to [employer] re: peer to peer on CT. System kept kicking me out and did not give me option for representative.  Spoke to pt. Will proceed with KUB and abdominal US to further evaluate. My suspicion for stone is growing based on her current flank pain. Spoke to imaging and they will get the pt in this evening to complete studies. She ate at 11:30 AM.

## 2019-02-26 ENCOUNTER — Telehealth: Payer: Self-pay | Admitting: Family

## 2019-02-26 ENCOUNTER — Encounter: Payer: Self-pay | Admitting: Family

## 2019-02-26 DIAGNOSIS — K760 Fatty (change of) liver, not elsewhere classified: Secondary | ICD-10-CM

## 2019-02-26 HISTORY — DX: Fatty (change of) liver, not elsewhere classified: K76.0

## 2019-02-26 NOTE — Telephone Encounter (Signed)
Spoke to pt. Reviewed results with pt. Recommended that she add one cap of miralax once daily for next few days, increase water and veggies due to constipation. Hopefully alleviating the constipation will help with her pain which she notes is still on the left "side" with mild tenderness to palpation.  She is advised to let me know how she is doing in a few days.

## 2019-04-22 ENCOUNTER — Encounter: Payer: Self-pay | Admitting: Family

## 2019-04-22 MED ORDER — CYANOCOBALAMIN 1000 MCG/ML IJ SOLN: 1000.0000 ug | INTRAMUSCULAR | 4 refills | Status: DC

## 2019-08-04 DIAGNOSIS — Z01419 Encounter for gynecological examination (general) (routine) without abnormal findings: Secondary | ICD-10-CM | POA: Diagnosis not present

## 2019-08-14 DIAGNOSIS — F410 Panic disorder [episodic paroxysmal anxiety] without agoraphobia: Secondary | ICD-10-CM | POA: Diagnosis not present

## 2019-08-14 DIAGNOSIS — F411 Generalized anxiety disorder: Secondary | ICD-10-CM | POA: Diagnosis not present

## 2019-11-23 DIAGNOSIS — H6983 Other specified disorders of Eustachian tube, bilateral: Secondary | ICD-10-CM | POA: Diagnosis not present

## 2019-12-24 ENCOUNTER — Encounter: Payer: Self-pay | Admitting: Family

## 2019-12-29 ENCOUNTER — Ambulatory Visit: Payer: BC Managed Care – PPO | Admitting: Family

## 2019-12-29 ENCOUNTER — Other Ambulatory Visit: Payer: Self-pay

## 2019-12-29 ENCOUNTER — Encounter: Payer: Self-pay | Admitting: Family

## 2019-12-29 ENCOUNTER — Telehealth: Payer: Self-pay | Admitting: Internal Medicine

## 2019-12-29 VITALS — BP 138/74 | HR 82 | Resp 16 | Ht 64.0 in | Wt 174.0 lb

## 2019-12-29 DIAGNOSIS — K589 Irritable bowel syndrome without diarrhea: Secondary | ICD-10-CM | POA: Diagnosis not present

## 2019-12-29 DIAGNOSIS — R809 Proteinuria, unspecified: Secondary | ICD-10-CM | POA: Diagnosis not present

## 2019-12-29 LAB — URINALYSIS, ROUTINE W REFLEX MICROSCOPIC
Bilirubin Urine: NEGATIVE
Glucose, UA: NEGATIVE
Hgb urine dipstick: NEGATIVE
Ketones, ur: NEGATIVE
Nitrite: NEGATIVE
Protein, ur: NEGATIVE
Specific Gravity, Urine: 1.025 (ref 1.001–1.03)
pH: 5.5 (ref 5.0–8.0)

## 2019-12-29 LAB — MICROSCOPIC MESSAGE

## 2019-12-29 NOTE — Progress Notes (Signed)
Subjective:    Patient ID: Jaclyn Tucker, female    DOB: June 14, 1975, 44 y.o.   MRN: [id number]  HPI  Patient is a 44 yr old female who presents today to discuss concerns about IBS. Report that she has improved her diet over the last year.  Reports that she lost 20 pounds with diet/exercise.   Wt Readings from Last 3 Encounters:  12/29/19 174 lb (78.9 kg)  02/11/19 189 lb (85.7 kg)  03/19/18 189 lb (85.7 kg)   Reports that about 6 weeks ago, she had frequent formed bm's then got constipated. This would alternated with diarrhea.  Reports stress level is unchanged.    Review of Systems    see HPI  Past Medical History:  Diagnosis Date   Anxiety    Depression    Fatty liver 02/26/2019   Fibromyalgia 06/2014   GERD (gastroesophageal reflux disease)    IBS (irritable bowel syndrome) 11/04/2014   Migraine    Vitamin B12 deficiency      Social History   Socioeconomic History   Marital status: Married    Spouse name: Nicole Kindred   Number of children: 2   Years of education: Not on file   Highest education level: 12th grade  Occupational History   Occupation: customer service  Tobacco Use   Smoking status: Former Smoker    Types: Cigarettes    Quit date: 04/13/2014    Years since quitting: 5.7   Smokeless tobacco: Never Used  Vaping Use   Vaping Use: Never used  Substance and Sexual Activity   Alcohol use: Yes    Alcohol/week: 0.0 standard drinks    Comment: 1-2 per month   Drug use: No   Sexual activity: Yes    Birth control/protection: None  Other Topics Concern   Not on file  Social History Narrative   Married   2 sons 1999 and 2008   Customer relations Physiological scientist since 2006   No caffeine   11/04/2014   Pt married, lives with husband and 2 children in a one level home. Drinks one cup of coffee a day, maybe 3 sodas a week. Works out at [employer] a week.   Social Determinants of Health   Financial Resource Strain:    Difficulty of  Paying Living Expenses: Not on file  Food Insecurity:    Worried About Charity fundraiser in the Last Year: Not on file   [employer] of Food in the Last Year: Not on file  Transportation Needs:    Lack of Transportation (Medical): Not on file   Lack of Transportation (Non-Medical): Not on file  Physical Activity:    Days of Exercise per Week: Not on file   Minutes of Exercise per Session: Not on file  Stress:    Feeling of Stress : Not on file  Social Connections:    Frequency of Communication with Friends and Family: Not on file   Frequency of Social Gatherings with Friends and Family: Not on file   Attends Religious Services: Not on file   Active Member of Clubs or Organizations: Not on file   Attends Archivist Meetings: Not on file   Marital Status: Not on file  Intimate Partner Violence:    Fear of Current or Ex-Partner: Not on file   Emotionally Abused: Not on file   Physically Abused: Not on file   Sexually Abused: Not on file    Past Surgical History:  Procedure Laterality  Date   g2 p2      Family History  Problem Relation Age of Onset   Diabetes Mother    Hypertension Mother    Cerebrovascular Accident Mother    Hypertension Father    Crohn's disease Father    Colon cancer Paternal Grandmother    Breast cancer Paternal Grandmother    Crohn's disease Brother    Anxiety disorder Sister    Lung disease Paternal Grandfather    Alzheimer's disease Other     Allergies  Allergen Reactions   Bee Venom Swelling   Cyclobenzaprine Swelling   Moxifloxacin Anaphylaxis and Palpitations    REACTION: nausea REACTION: nausea REACTION: nausea   Moxifloxacin Hcl In Nacl Anaphylaxis   Penicillins Other (See Comments)    REACTION: swelling REACTION: swelling Parents told her she couldn't take it   Quinolones Anaphylaxis and Palpitations    REACTION: nausea   Shellfish Allergy Rash   Sulfate Rash   Sumatriptan Succinate       Other reaction(s): Dizziness, Other REACTION: nausea, dizziness, panic symptoms REACTION: nausea, dizziness, panic symptoms   Celexa [Citalopram Hydrobromide] Other (See Comments)    Panic attacks   Duloxetine Other (See Comments)    Panic attacks   Escitalopram Other (See Comments)    Panic attacks   Ranitidine Hcl     Other reaction(s): Other (See Comments) uknown   Sumatriptan Succinate     REACTION: nausea, dizziness, panic symptoms    Current Outpatient Medications on File Prior to Visit  Medication Sig Dispense Refill   ALPRAZolam (XANAX) 0.5 MG tablet Take 0.5 mg by mouth daily as needed.      cyanocobalamin (,VITAMIN B-12,) 1000 MCG/ML injection Inject 1 mL (1,000 mcg total) into the muscle every 30 (thirty) days. 3 mL 4   EPINEPHrine 0.3 mg/0.3 mL IJ SOAJ injection As needed.     pantoprazole (PROTONIX) 40 MG tablet TAKE 1 TABLET(40 MG) BY MOUTH DAILY 30 tablet 3   sertraline (ZOLOFT) 50 MG tablet Take 75 mg by mouth daily.      Syringe/Needle, Disp, (SYRINGE 3CC/20GX1-1/2") 20G X 1-1/2" 3 ML MISC Use to inject b12 12 each 0   valACYclovir (VALTREX) 1000 MG tablet As needed.     No current facility-administered medications on file prior to visit.    BP 138/74 (BP Location: Right Arm, Patient Position: Sitting, Cuff Size: Normal)    Pulse 82    Resp 16    Ht 5\' 4"  (1.626 m)    Wt 174 lb (78.9 kg)    SpO2 98%    BMI 29.87 kg/m    Objective:   Physical Exam Constitutional:      Appearance: She is well-developed.  Neck:     Thyroid: No thyromegaly.  Cardiovascular:     Rate and Rhythm: Normal rate and regular rhythm.     Heart sounds: Normal heart sounds. No murmur heard.   Pulmonary:     Effort: Pulmonary effort is normal. No respiratory distress.     Breath sounds: Normal breath sounds. No wheezing.  Abdominal:     General: Bowel sounds are normal.     Palpations: Abdomen is soft.     Tenderness: There is no abdominal tenderness.   Musculoskeletal:     Cervical back: Neck supple.  Skin:    General: Skin is warm and dry.  Neurological:     Mental Status: She is alert and oriented to person, place, and time.  Psychiatric:  Behavior: Behavior normal.        Thought Content: Thought content normal.        Judgment: Judgment normal.           Assessment & Plan:  IBS- uncontrolled. She reports + family hx of IBS, colon cancer (grandmother) and Crohn's (dad and sibling). Will refer to GI for further evaluation.  Proteinuria- noted on POC dip last visit.  Will repeat today.  Declines flu shot today.  This visit occurred during the SARS-CoV-2 public health emergency.  Safety protocols were in place, including screening questions prior to the visit, additional usage of staff PPE, and extensive cleaning of exam room while observing appropriate contact time as indicated for disinfecting solutions.

## 2019-12-29 NOTE — Patient Instructions (Signed)
Please complete lab work prior to leaving. You should be contacted about scheduling your appointment with GI.

## 2019-12-29 NOTE — Telephone Encounter (Signed)
No problem.  Happy to see her in the [facility] office.  Thank you.

## 2019-12-29 NOTE — Telephone Encounter (Signed)
I approve of the switch to Dr. Bryan Lemma

## 2019-12-29 NOTE — Telephone Encounter (Signed)
Dr. Bryan Lemma, Will you accept this patient?

## 2019-12-31 ENCOUNTER — Encounter: Payer: Self-pay | Admitting: Family

## 2020-01-07 ENCOUNTER — Encounter: Payer: Self-pay | Admitting: Gastroenterology

## 2020-02-10 DIAGNOSIS — L989 Disorder of the skin and subcutaneous tissue, unspecified: Secondary | ICD-10-CM | POA: Diagnosis not present

## 2020-02-11 DIAGNOSIS — F411 Generalized anxiety disorder: Secondary | ICD-10-CM | POA: Diagnosis not present

## 2020-02-11 DIAGNOSIS — F410 Panic disorder [episodic paroxysmal anxiety] without agoraphobia: Secondary | ICD-10-CM | POA: Diagnosis not present

## 2020-02-16 ENCOUNTER — Encounter: Payer: Self-pay | Admitting: Gastroenterology

## 2020-02-16 ENCOUNTER — Ambulatory Visit: Payer: BC Managed Care – PPO | Admitting: Gastroenterology

## 2020-02-16 VITALS — BP 128/84 | HR 83 | Ht 65.0 in | Wt 179.0 lb

## 2020-02-16 DIAGNOSIS — Z8371 Family history of colonic polyps: Secondary | ICD-10-CM

## 2020-02-16 DIAGNOSIS — R195 Other fecal abnormalities: Secondary | ICD-10-CM | POA: Diagnosis not present

## 2020-02-16 DIAGNOSIS — Z8379 Family history of other diseases of the digestive system: Secondary | ICD-10-CM

## 2020-02-16 DIAGNOSIS — K921 Melena: Secondary | ICD-10-CM | POA: Diagnosis not present

## 2020-02-16 DIAGNOSIS — Z800 Family history of malignant neoplasm of digestive organs: Secondary | ICD-10-CM | POA: Diagnosis not present

## 2020-02-16 DIAGNOSIS — R1010 Upper abdominal pain, unspecified: Secondary | ICD-10-CM

## 2020-02-16 MED ORDER — CLENPIQ 10-3.5-12 MG-GM -GM/160ML PO SOLN: 1.0000 | ORAL | 0 refills | Status: DC

## 2020-02-16 NOTE — Patient Instructions (Signed)
If you are age 42 or older, your body mass index should be between 23-30. Your Body mass index is 29.79 kg/m. If this is out of the aforementioned range listed, please consider follow up with your Primary Care Provider.  If you are age 64 or younger, your body mass index should be between 19-25. Your Body mass index is 29.79 kg/m. If this is out of the aformentioned range listed, please consider follow up with your Primary Care Provider.   We have sent the following medications to your pharmacy for you to pick up at your convenience: Clenpiq for colonoscopy   Please add a fiber supplement to your diet.  Due to recent changes in healthcare laws, you may see the results of your imaging and laboratory studies on MyChart before your provider has had a chance to review them.  We understand that in some cases there may be results that are confusing or concerning to you. Not all laboratory results come back in the same time frame and the provider may be waiting for multiple results in order to interpret others.  Please give [place] 48 hours in order for your provider to thoroughly review all the results before contacting the office for clarification of your results.   Thank you for choosing me and [facility].  Vito Cirigliano, D.O.

## 2020-02-16 NOTE — Progress Notes (Signed)
Chief Complaint: Change in bowel habits, abdominal pain   Referring Provider:     Debbrah Alar, NP   HPI:     Jaclyn Tucker is a 44 y.o. female with a history of anxiety, depression, fatty liver, fibromyalgia, GERD, IBS, migraines, B12 deficiency, referred to the [facility]ology [facility] for evaluation of  change in bowel habits, and abdominal pain.  She reports a long history of alternating diarrhea/constpation, no recent changes in bowel habits. Can go 2-3 months without sxs, then sxs recur.  More recently was having 1 month of symptoms with associated left-sided abdominal pain, prompting her to schedule appointment.  Pain can last several weeks at a time, and not related to bowel habits.  Symptoms not related to p.o. intake.  Intermittent rectal pain with symptoms, but none in the last several weeks.  She has trialed Metamucil in the past with some improvement, but does not take regularly. Otherwise, has not trialed meds for sxs aside from Ibuprofen for pain.   Has had intermittent BRB on tissue paper which tends to occur with constipation.  Does report family history of colon cancer (paternal grandmother) and Crohn's Disease (father and sibling), polyps (father; undergoes frequent surveillance).   Previously seen by Dr. Carlean Purl for LPR and chronic sore throat.  Last seen 04/2016.  Improvement with PPI twice daily.  No upper GI sxs now. Has been oof Protonix fo >1 year with rare reflux sxs since then.   Previous to that, diagnosed with IBS and functional dyspepsia in 2016.  CT abdomen/pelvis ordered by PCM scheduled for 02/24/2020  Previous GI work-up: -Abdominal ultrasound (10/2016, indication: Epigastric pain): Hepatic steatosis, otherwise normal -Abdominal ultrasound (02/2019, indication: LUQ pain): Small amount of GB sludge, otherwise normal GB, CBD.  Fatty liver without duct dilatation. -Abd X-ray (02/2019): Moderate stool burden, o/w normal -02/2019:  Normal CBC, CMP, lipase   Endoscopic History: -EGD (11/2014, Dr. Carlean Purl): Multiple gastric polyps (path: Fundic no polyps) and non-H. pylori/nonulcer gastritis, Otherwise normal   Past Medical History:  Diagnosis Date  . Anxiety   . Depression   . Fatty liver 02/26/2019  . Fibromyalgia 06/2014  . GERD (gastroesophageal reflux disease)   . IBS (irritable bowel syndrome) 11/04/2014  . Migraine   . Obese   . Vitamin B12 deficiency      Past Surgical History:  Procedure Laterality Date  . g2 p2     Family History  Problem Relation Age of Onset  . Diabetes Mother   . Hypertension Mother   . Cerebrovascular Accident Mother   . Hypertension Father   . Crohn's disease Father   . Colon polyps Father   . Colon cancer Paternal Grandmother   . Breast cancer Paternal Grandmother   . Crohn's disease Brother   . Anxiety disorder Sister   . Lung disease Paternal Grandfather   . Alzheimer's disease Other   . Esophageal cancer Neg Hx    Social History   Tobacco Use  . Smoking status: Former Smoker    Types: Cigarettes    Quit date: 04/13/2014    Years since quitting: 5.8  . Smokeless tobacco: Never Used  Vaping Use  . Vaping Use: Former  Substance Use Topics  . Alcohol use: Yes    Alcohol/week: 0.0 standard drinks    Comment: 1-2 per month  . Drug use: No   Current Outpatient Medications  Medication Sig Dispense Refill  . ALPRAZolam (XANAX) 0.5  MG tablet Take 0.5 mg by mouth daily as needed.     . cyanocobalamin (,VITAMIN B-12,) 1000 MCG/ML injection Inject 1 mL (1,000 mcg total) into the muscle every 30 (thirty) days. 3 mL 4  . pantoprazole (PROTONIX) 40 MG tablet TAKE 1 TABLET(40 MG) BY MOUTH DAILY (Patient taking differently: Take 40 mg by mouth as needed. ) 30 tablet 3  . sertraline (ZOLOFT) 50 MG tablet Take 75 mg by mouth daily.     . Syringe/Needle, Disp, (SYRINGE 3CC/20GX1-1/2") 20G X 1-1/2" 3 ML MISC Use to inject b12 12 each 0  . valACYclovir (VALTREX) 1000 MG  tablet As needed.    Marland Kitchen EPINEPHrine 0.3 mg/0.3 mL IJ SOAJ injection As needed. (Patient not taking: Reported on 02/16/2020)     No current facility-administered medications for this visit.   Allergies  Allergen Reactions  . Bee Venom Swelling  . Cyclobenzaprine Swelling  . Moxifloxacin Anaphylaxis and Palpitations    REACTION: nausea REACTION: nausea REACTION: nausea  . Moxifloxacin Hcl In Nacl Anaphylaxis  . Penicillins Other (See Comments)    REACTION: swelling REACTION: swelling Parents told her she couldn't take it  . Quinolones Anaphylaxis and Palpitations    REACTION: nausea  . Shellfish Allergy Rash  . Sulfate Rash  . Sumatriptan Succinate     Other reaction(s): Dizziness, Other REACTION: nausea, dizziness, panic symptoms REACTION: nausea, dizziness, panic symptoms  . Celexa [Citalopram Hydrobromide] Other (See Comments)    Panic attacks  . Duloxetine Other (See Comments)    Panic attacks  . Escitalopram Other (See Comments)    Panic attacks  . Ranitidine Hcl     Other reaction(s): Other (See Comments) uknown  . Sumatriptan Succinate     REACTION: nausea, dizziness, panic symptoms     Review of Systems: All systems reviewed and negative except where noted in HPI.     Physical Exam:    Wt Readings from Last 3 Encounters:  02/16/20 179 lb (81.2 kg)  12/29/19 174 lb (78.9 kg)  02/11/19 189 lb (85.7 kg)    BP 128/84   Pulse 83   Ht 5\' 5"  (1.651 m)   Wt 179 lb (81.2 kg)   BMI 29.79 kg/m  Constitutional:  Pleasant, in no acute distress. Psychiatric: Normal mood and affect. Behavior is normal. EENT: Pupils normal.  Conjunctivae are normal. No scleral icterus. Neck supple. No cervical LAD. Cardiovascular: Normal rate, regular rhythm. No edema Pulmonary/chest: Effort normal and breath sounds normal. No wheezing, rales or rhonchi. Abdominal: Soft, nondistended, nontender. Bowel sounds active throughout. There are no masses palpable. No  hepatomegaly. Neurological: Alert and oriented to person place and time. Skin: Skin is warm and dry. No rashes noted. Rectal: Exam deferred by patient to time of colonoscopy.    ASSESSMENT AND PLAN;   1) Change in bowel habits 2) Alternating diarrhea/constipation 3) Left-sided abdominal pain 4) Family history of colon polyps 5) Family history of Crohn's Disease 6) Remote family history of colon cancer 7) Hematochezia  Long history of lower GI symptoms, but recent exacerbation symptoms.  Patient with heightened concerns given change in symptoms along with family history.  Father with history of Crohn's Disease along with colon polyps requires frequent surveillance.  Paternal grandmother with CRC diagnosed in her late 9s.  -Colonoscopy -Start regular fiber supplement such as Benefiber or Citrucel -If clinically significant hemorrhoids, can plan for hemorrhoid banding  8) GERD -Well-controlled with dietary modifications alone  The indications, risks, and benefits of colonoscopy were explained to  the patient in detail. Risks include but are not limited to bleeding, perforation, adverse reaction to medications, and cardiopulmonary compromise. Sequelae include but are not limited to the possibility of surgery, hospitalization, and mortality. The patient verbalized understanding and wished to proceed. All questions answered, referred to the scheduler and bowel prep ordered. Further recommendations pending results of the exam.    Lavena Bullion, DO, FACG  02/16/2020, 11:04 AM   Debbrah Alar, NP

## 2020-02-23 ENCOUNTER — Other Ambulatory Visit: Payer: Self-pay

## 2020-02-23 ENCOUNTER — Ambulatory Visit (AMBULATORY_SURGERY_CENTER): Payer: BC Managed Care – PPO | Admitting: Gastroenterology

## 2020-02-23 ENCOUNTER — Encounter: Payer: Self-pay | Admitting: Gastroenterology

## 2020-02-23 VITALS — BP 114/75 | HR 67 | Temp 98.5°F | Resp 17 | Ht 65.0 in | Wt 179.0 lb

## 2020-02-23 DIAGNOSIS — D122 Benign neoplasm of ascending colon: Secondary | ICD-10-CM

## 2020-02-23 DIAGNOSIS — D124 Benign neoplasm of descending colon: Secondary | ICD-10-CM

## 2020-02-23 DIAGNOSIS — K641 Second degree hemorrhoids: Secondary | ICD-10-CM | POA: Diagnosis not present

## 2020-02-23 DIAGNOSIS — R194 Change in bowel habit: Secondary | ICD-10-CM | POA: Diagnosis not present

## 2020-02-23 DIAGNOSIS — R195 Other fecal abnormalities: Secondary | ICD-10-CM

## 2020-02-23 DIAGNOSIS — K635 Polyp of colon: Secondary | ICD-10-CM

## 2020-02-23 MED ORDER — SODIUM CHLORIDE 0.9 % IV SOLN: 500.0000 mL | Freq: Once | INTRAVENOUS | Status: DC

## 2020-02-23 NOTE — Patient Instructions (Signed)
Await pathology, next colonoscopy based on pathology    Please read over handout about polyps, hemorrhoids and hemorrhoidal banding  Use fiber- over the counter- Citrucel, Fibercon, Konsyl or Metamucil  Continue your normal medications  Please call Dr. Bryan Lemma if you are interested in having the in office hemorrhoidal banding done  YOU HAD AN ENDOSCOPIC PROCEDURE TODAY AT [facility]:   Refer to the procedure report that was given to you for any specific questions about what was found during the examination.  If the procedure report does not answer your questions, please call your gastroenterologist to clarify.  If you requested that your care partner not be given the details of your procedure findings, then the procedure report has been included in a sealed envelope for you to review at your convenience later.  YOU SHOULD EXPECT: Some feelings of bloating in the abdomen. Passage of more gas than usual.  Walking can help get rid of the air that was put into your GI tract during the procedure and reduce the bloating. If you had a lower endoscopy (such as a colonoscopy or flexible sigmoidoscopy) you may notice spotting of blood in your stool or on the toilet paper. If you underwent a bowel prep for your procedure, you may not have a normal bowel movement for a few days.  Please Note:  You might notice some irritation and congestion in your nose or some drainage.  This is from the oxygen used during your procedure.  There is no need for concern and it should clear up in a day or so.  SYMPTOMS TO REPORT IMMEDIATELY:   Following lower endoscopy (colonoscopy or flexible sigmoidoscopy):  Excessive amounts of blood in the stool  Significant tenderness or worsening of abdominal pains  Swelling of the abdomen that is new, acute  Fever of 100F or higher  For urgent or emergent issues, a gastroenterologist can be reached at any hour by calling [phone]. Do not use MyChart  messaging for urgent concerns.    DIET:  We do recommend a small meal at first, but then you may proceed to your regular diet.  Drink plenty of fluids but you should avoid alcoholic beverages for 24 hours.  ACTIVITY:  You should plan to take it easy for the rest of today and you should NOT DRIVE or use heavy machinery until tomorrow (because of the sedation medicines used during the test).    FOLLOW UP: Our staff will call the number listed on your records 48-72 hours following your procedure to check on you and address any questions or concerns that you may have regarding the information given to you following your procedure. If we do not reach you, we will leave a message.  We will attempt to reach you two times.  During this call, we will ask if you have developed any symptoms of COVID 19. If you develop any symptoms (ie: fever, flu-like symptoms, shortness of breath, cough etc.) before then, please call [phone].  If you test positive for Covid 19 in the 2 weeks post procedure, please call and report this information to [place].    If any biopsies were taken you will be contacted by phone or by letter within the next 1-3 weeks.  Please call us at [phone] if you have not heard about the biopsies in 3 weeks.    SIGNATURES/CONFIDENTIALITY: You and/or your care partner have signed paperwork which will be entered into your electronic medical record.  These signatures attest  to the fact that that the information above on your After Visit Summary has been reviewed and is understood.  Full responsibility of the confidentiality of this discharge information lies with you and/or your care-partner.

## 2020-02-23 NOTE — Op Note (Signed)
[facility] Patient Name: Jaclyn Tucker Procedure Date: 02/23/2020 3:42 PM MRN: [id number] Endoscopist: Gerrit Heck , MD Age: 44 Referring MD:  Date of Birth: 1975/10/04 Gender: Female Account #: [id number] Procedure:                Colonoscopy Indications:              Hematochezia, Change in bowel habits                           Family history notable for father with polyps,                            paternal grandmother with colon cancer, and father                            and sibling with Crohns Disease. Medicines:                Monitored Anesthesia Care Procedure:                Pre-Anesthesia Assessment:                           - Prior to the procedure, a History and Physical                            was performed, and patient medications and                            allergies were reviewed. The patient's tolerance of                            previous anesthesia was also reviewed. The risks                            and benefits of the procedure and the sedation                            options and risks were discussed with the patient.                            All questions were answered, and informed consent                            was obtained. Prior Anticoagulants: The patient has                            taken no previous anticoagulant or antiplatelet                            agents. ASA Grade Assessment: II - A patient with                            mild systemic disease. After reviewing the risks  and benefits, the patient was deemed in                            satisfactory condition to undergo the procedure.                           After obtaining informed consent, the colonoscope                            was passed under direct vision. Throughout the                            procedure, the patient's blood pressure, pulse, and                            oxygen saturations were monitored  continuously. The                            Colonoscope was introduced through the anus and                            advanced to the the terminal ileum. The colonoscopy                            was performed without difficulty. The patient                            tolerated the procedure well. The quality of the                            bowel preparation was good. The terminal ileum,                            ileocecal valve, appendiceal orifice, and rectum                            were photographed. Scope In: 3:52:11 PM Scope Out: 4:07:54 PM Scope Withdrawal Time: 0 hours 13 minutes 0 seconds  Total Procedure Duration: 0 hours 15 minutes 43 seconds  Findings:                 The perianal and digital rectal examinations were                            normal.                           A 1 mm polyp was found in the ascending colon. The                            polyp was sessile. The polyp was removed with a                            cold biopsy forceps. Resection and retrieval were  complete. Estimated blood loss was minimal.                           A 5 mm polyp was found in the descending colon. The                            polyp was sessile. The polyp was removed with a                            cold snare. Resection and retrieval were complete.                            Estimated blood loss was minimal.                           Normal mucosa was otherwise found in the entire                            colon. Biopsies for histology were taken with a                            cold forceps from the right colon and left colon                            for evaluation of microscopic colitis. Estimated                            blood loss was minimal.                           Non-bleeding internal hemorrhoids were found during                            retroflexion. The hemorrhoids were small and Grade                            II  (internal hemorrhoids that prolapse but reduce                            spontaneously).                           The terminal ileum appeared normal. Complications:            No immediate complications. Estimated Blood Loss:     Estimated blood loss was minimal. Impression:               - One 1 mm polyp in the ascending colon, removed                            with a cold biopsy forceps. Resected and retrieved.                           - One 5 mm polyp in the descending colon, removed  with a cold snare. Resected and retrieved.                           - Normal mucosa in the entire examined colon.                            Biopsied.                           - Non-bleeding internal hemorrhoids.                           - The examined portion of the ileum was normal. Recommendation:           - Patient has a contact number available for                            emergencies. The signs and symptoms of potential                            delayed complications were discussed with the                            patient. Return to normal activities tomorrow.                            Written discharge instructions were provided to the                            patient.                           - Resume previous diet.                           - Continue present medications.                           - Await pathology results.                           - Interval for repeat colonoscopy for ongoing                            surveillance based on pathology results.                           - Return to GI clinic PRN.                           - Use fiber, for example Citrucel, Fibercon, Konsyl                            or Metamucil.                           - Internal hemorrhoids were noted on this study and  may be amenable to hemorrhoid band ligation. If you                            are interested in further treatment of  these                            hemorrhoids with band ligation, please contact my                            clinic to set up an appointment for evaluation and                            treatment. Gerrit Heck, MD 02/23/2020 4:14:03 PM

## 2020-02-23 NOTE — Progress Notes (Signed)
Called to room to assist during endoscopic procedure.  Patient ID and intended procedure confirmed with present staff. Received instructions for my participation in the procedure from the performing physician.  

## 2020-02-23 NOTE — Progress Notes (Signed)
Report to PACU, RN, vss, BBS= Clear.  

## 2020-02-23 NOTE — Progress Notes (Signed)
Medical history reviewed with no changes noted. VS assessed by J.D 

## 2020-02-24 ENCOUNTER — Telehealth: Payer: Self-pay | Admitting: General Surgery

## 2020-02-24 NOTE — Telephone Encounter (Signed)
-----   Message from [place], DO sent at 02/23/2020  4:23 PM EST ----- Please schedule for hemorrhoid banidng. Thanks.

## 2020-02-24 NOTE — Telephone Encounter (Signed)
Spoke with the patient and scheduled hemorrhoid banding for 04/03/2019 @1 :20pm

## 2020-02-25 ENCOUNTER — Telehealth: Payer: Self-pay

## 2020-02-25 ENCOUNTER — Telehealth: Payer: Self-pay | Admitting: *Deleted

## 2020-02-25 NOTE — Telephone Encounter (Signed)
  Follow up Call-  Call back number 02/23/2020  Post procedure Call Back phone  # [phone]  Permission to leave phone message Yes  Some recent data might be hidden     Patient questions:  Message left to call us if necessary.

## 2020-02-25 NOTE — Telephone Encounter (Signed)
  Follow up Call-  Call back number 02/23/2020  Post procedure Call Back phone  # [phone]  Permission to leave phone message Yes  Some recent data might be hidden     Patient questions:  Do you have a fever, pain , or abdominal swelling? No. Pain Score  0 *  Have you tolerated food without any problems? Yes.    Have you been able to return to your normal activities? Yes.    Do you have any questions about your discharge instructions: Diet   No. Medications  No. Follow up visit  No.  Do you have questions or concerns about your Care? No.  Actions: * If pain score is 4 or above: 1. No action needed, pain <Have you developed a fever since your procedure? no  2.   Have you had an respiratory symptoms (SOB or cough) since your procedure? no  3.   Have you tested positive for COVID 19 since your procedure no  4.   Have you had any family members/close contacts diagnosed with the COVID 19 since your procedure?  no   If yes to any of these questions please route to Joylene John, RN and Joella Prince, RN

## 2020-03-01 ENCOUNTER — Encounter: Payer: Self-pay | Admitting: Gastroenterology

## 2020-03-03 DIAGNOSIS — L501 Idiopathic urticaria: Secondary | ICD-10-CM | POA: Diagnosis not present

## 2020-03-03 DIAGNOSIS — J309 Allergic rhinitis, unspecified: Secondary | ICD-10-CM | POA: Diagnosis not present

## 2020-04-02 ENCOUNTER — Encounter: Payer: Self-pay | Admitting: Gastroenterology

## 2020-04-02 ENCOUNTER — Ambulatory Visit (INDEPENDENT_AMBULATORY_CARE_PROVIDER_SITE_OTHER): Payer: BC Managed Care – PPO | Admitting: Gastroenterology

## 2020-04-02 DIAGNOSIS — K641 Second degree hemorrhoids: Secondary | ICD-10-CM | POA: Diagnosis not present

## 2020-04-02 NOTE — Progress Notes (Signed)
P  Chief Complaint:    Symptomatic Internal Hemorrhoids; Hemorrhoid Band Ligation  GI History: 45 year old female with a history of anxiety, depression, fatty liver, fibromyalgia, migraines, B12 deficiency follows in the GI clinic for the following:  1) GERD: History of LPR and chronic sore throat.  Improved with PPI twice daily with resolution of symptoms.  Off all acid suppression therapy now with very rare symptoms.  2) IBS, functional dyspepsia.  Diagnosed in 2016.  Alternating diarrhea/constipation.  Can go 2-3 months without symptoms, then symptoms recur.  Symptoms can last several weeks at a time.  Improves if she takes fiber supplement.  3) Symptomatic hemorrhoids: Intermittent BRBPR and rectal discomfort/pruritus ani, worse with episodes of constipation as above.  4) Family history of colon cancer (paternal grandmother) and Crohn's Disease (father and sibling), polyps (father; undergoes frequent surveillance).   Previous GI work-up: -Abdominal ultrasound (10/2016, indication: Epigastric pain): Hepatic steatosis, otherwise normal -Abdominal ultrasound (02/2019, indication: LUQ pain): Small amount of GB sludge, otherwise normal GB, CBD.  Fatty liver without duct dilatation. -Abd X-ray (02/2019): Moderate stool burden, o/w normal -02/2019: Normal CBC, CMP, lipase   Endoscopic History: -EGD (11/2014, Dr. Carlean Purl): Multiple gastric polyps (path: Fundic no polyps) and non-H. pylori/nonulcer gastritis, Otherwise normal - Colonoscopy (02/2020, Dr. Bryan Lemma): 5 mm tubular adenoma, normal colonic mucosa (biopsies negative for [facility]), grade 2 hemorrhoids.  Normal TI.  Repeat 5 years due to family history  HPI:     Patient is a 45 y.o. femalewith a history of symptomatic internal hemorrhoids presenting to the Gastroenterology Clinic for follow-up and ongoing treatment. The patient presents with symptomatic grade 2 hemorrhoids, unresponsive to maximal medical therapy, requesting rubber  band ligation of symptomatic hemorrhoidal disease.  No change in medical or surgical history, medications, allergies, social history since last appointment with me.   Review of systems:     No chest pain, no SOB, no fevers, no urinary sx   Past Medical History:  Diagnosis Date   Anxiety    Depression    Fatty liver 02/26/2019   Fibromyalgia 06/2014   GERD (gastroesophageal reflux disease)    IBS (irritable bowel syndrome) 11/04/2014   Migraine    Obese    Vitamin B12 deficiency     Patient's surgical history, family medical history, social history, medications and allergies were all reviewed in Epic    Current Outpatient Medications  Medication Sig Dispense Refill   ALPRAZolam (XANAX) 0.5 MG tablet Take 0.5 mg by mouth daily as needed.      cyanocobalamin (,VITAMIN B-12,) 1000 MCG/ML injection Inject 1 mL (1,000 mcg total) into the muscle every 30 (thirty) days. 3 mL 4   EPINEPHrine 0.3 mg/0.3 mL IJ SOAJ injection As needed. (Patient not taking: No sig reported)     pantoprazole (PROTONIX) 40 MG tablet TAKE 1 TABLET(40 MG) BY MOUTH DAILY (Patient not taking: Reported on 02/23/2020) 30 tablet 3   sertraline (ZOLOFT) 50 MG tablet Take 75 mg by mouth daily.      Syringe/Needle, Disp, (SYRINGE 3CC/20GX1-1/2") 20G X 1-1/2" 3 ML MISC Use to inject b12 (Patient not taking: Reported on 02/23/2020) 12 each 0   valACYclovir (VALTREX) 1000 MG tablet As needed. (Patient not taking: Reported on 02/23/2020)     No current facility-administered medications for this visit.    Physical Exam:     There were no vitals taken for this visit.  GENERAL:  Pleasant female in NAD PSYCH: : Cooperative, normal affect NEURO: Alert and oriented x  3, no focal neurologic deficits Rectal exam: Sensation intact and preserved anal wink.   Small external skin tag.  Grade 2 hemorrhoids noted in all positions on anoscopy.  No external anal fissures noted. Normal sphincter tone. No palpable mass.  No blood on the exam glove. (Chaperone: Curlene Labrum, CMA).   IMPRESSION and PLAN:    #1.  Symptomatic internal hemorrhoids: PROCEDURE NOTE: The patient presents with symptomatic grade 2 hemorrhoids, unresponsive to maximal medical therapy, requesting rubber band ligation of symptomatic hemorrhoidal disease.  All risks, benefits and alternative forms of therapy were described and informed consent was obtained.  In the Left Lateral Decubitus position, anoscopic examination revealed grade 2 hemorrhoids in the all position(s).  The anorectum was pre-medicated with RectiCare. The decision was made to band the RP internal hemorrhoid, and the [place] was used to perform band ligation without complication.  Digital anorectal examination was then performed to assure proper positioning of the band, and to adjust the banded tissue as required.  The patient was discharged home without pain or other issues.  Dietary and behavioral recommendations were given and along with follow-up instructions.     The following adjunctive treatments were recommended:  -Resume high-fiber diet with fiber supplement (i.e. Citrucel or Benefiber) with goal for soft stools without straining to have a BM. -Resume adequate fluid intake.  The patient will return in 2-4 for  follow-up and possible additional banding as required. No complications were encountered and the patient tolerated the procedure well.      #2.  IBS mixed type - Continue fiber supplement  #3.  GERD: - Well-controlled with dietary modifications alone  #4.  History of tubular adenoma #5.  Family history of colon polyps, Crohn's Disease and remote family history of colon cancer - Repeat colonoscopy in 5-year interval (02/2025)      Jaclyn Tucker ,DO, FACG 04/02/2020, 1:15 PM

## 2020-04-02 NOTE — Patient Instructions (Signed)
If you are age 43 or older, your body mass index should be between 23-30. Your Body mass index is 29.7 kg/m. If this is out of the aforementioned range listed, please consider follow up with your Primary Care Provider.  If you are age 24 or younger, your body mass index should be between 19-25. Your Body mass index is 29.7 kg/m. If this is out of the aformentioned range listed, please consider follow up with your Primary Care Provider.   HEMORRHOID BANDING PROCEDURE    FOLLOW-UP CARE   1. The procedure you have had should have been relatively painless since the banding of the area involved does not have nerve endings and there is no pain sensation.  The rubber band cuts off the blood supply to the hemorrhoid and the band may fall off as soon as 48 hours after the banding (the band may occasionally be seen in the toilet bowl following a bowel movement). You may notice a temporary feeling of fullness in the rectum which should respond adequately to plain Tylenol or Motrin.  2. Following the banding, avoid strenuous exercise that evening and resume full activity the next day.  A sitz bath (soaking in a warm tub) or bidet is soothing, and can be useful for cleansing the area after bowel movements.     3. To avoid constipation, take two tablespoons of natural wheat bran, natural oat bran, flax, Benefiber or any over the counter fiber supplement and increase your water intake to 7-8 glasses daily.    4. Unless you have been prescribed anorectal medication, do not put anything inside your rectum for two weeks: No suppositories, enemas, fingers, etc.  5. Occasionally, you may have more bleeding than usual after the banding procedure.  This is often from the untreated hemorrhoids rather than the treated one.  Dont be concerned if there is a tablespoon or so of blood.  If there is more blood than this, lie flat with your bottom higher than your head and apply an ice pack to the area. If the bleeding  does not stop within a half an hour or if you feel faint, call our office at [phone] or go to the emergency room.  6. Problems are not common; however, if there is a substantial amount of bleeding, severe pain, chills, fever or difficulty passing urine (very rare) or other problems, you should call us at [phone] or report to the nearest emergency room.  7. Do not stay seated continuously for more than 2-3 hours for a day or two after the procedure.  Tighten your buttock muscles 10-15 times every two hours and take 10-15 deep breaths every 1-2 hours.  Do not spend more than a few minutes on the toilet if you cannot empty your bowel; instead re-visit the toilet at a later time.  You have been scheduled top have your second banding on April 30, 2020 at 3:40 pm with Dr. Bryan Lemma.

## 2020-04-14 ENCOUNTER — Telehealth: Payer: Self-pay

## 2020-04-14 ENCOUNTER — Other Ambulatory Visit: Payer: Self-pay

## 2020-04-14 ENCOUNTER — Encounter (HOSPITAL_COMMUNITY): Payer: Self-pay

## 2020-04-14 ENCOUNTER — Ambulatory Visit (HOSPITAL_COMMUNITY)
Admission: RE | Admit: 2020-04-14 | Discharge: 2020-04-14 | Disposition: A | Discharge status: Home / Self Care | Payer: BC Managed Care – PPO | Source: Ambulatory Visit | Attending: Family Medicine | Admitting: Family Medicine

## 2020-04-14 VITALS — BP 140/90 | HR 78 | Temp 98.4°F | Resp 16 | Ht 65.0 in | Wt 174.0 lb

## 2020-04-14 DIAGNOSIS — R1013 Epigastric pain: Secondary | ICD-10-CM

## 2020-04-14 DIAGNOSIS — R1011 Right upper quadrant pain: Secondary | ICD-10-CM | POA: Diagnosis not present

## 2020-04-14 LAB — POCT URINALYSIS DIPSTICK, ED / UC
Bilirubin Urine: NEGATIVE
Glucose, UA: NEGATIVE mg/dL
Hgb urine dipstick: NEGATIVE
Ketones, ur: NEGATIVE mg/dL
Leukocytes,Ua: NEGATIVE
Nitrite: NEGATIVE
Protein, ur: NEGATIVE mg/dL
Specific Gravity, Urine: 1.02 (ref 1.005–1.030)
Urobilinogen, UA: 0.2 mg/dL (ref 0.0–1.0)
pH: 7 (ref 5.0–8.0)

## 2020-04-14 MED ORDER — OMEPRAZOLE 20 MG PO CPDR: 20.0000 mg | DELAYED_RELEASE_CAPSULE | Freq: Every day | ORAL | 0 refills | Status: DC

## 2020-04-14 MED ORDER — SUCRALFATE 1 G PO TABS: 1.0000 g | ORAL_TABLET | Freq: Three times a day (TID) | ORAL | 0 refills | Status: DC

## 2020-04-14 NOTE — Telephone Encounter (Signed)
Caller states she wanted to schedule an appointment. Caller states she has been upper abd pain that radiates to her back since Saturday on and off and diarrhea.  Telephone; [phone]

## 2020-04-14 NOTE — Telephone Encounter (Signed)
VV or OV

## 2020-04-14 NOTE — Discharge Instructions (Addendum)
Please follow a low to no fast diet in case your gall bladder were to be involved

## 2020-04-14 NOTE — ED Triage Notes (Signed)
Pt presents today with mid upper abd pain x 5 days, pain is intermittent and radiates to mid back, +diarrhea, denies n/v, pain 5/10 (soreness).

## 2020-04-14 NOTE — ED Provider Notes (Signed)
[place]    CSN: [id number] Arrival date & time: 04/14/20  1353      History   Chief Complaint Chief Complaint  Patient presents with  . Abdominal Pain    Mid, upper quadrant    HPI Jaclyn Tucker is a 45 y.o. female.   HPI  Abdominal Pain: Pt states that for the past 5 day she has had intermittent upper abdominal pain that radiated to the back. Pain rated 5/10 at its worst. Pain has occurred twice; once last night and once the day before. Once occurred after eating taco salad. She also has associated diarrhea but denies nausea/vomiting, fever, yellowing of skin, rash. She has not taken anything for pain.     Past Medical History:  Diagnosis Date  . Anxiety   . Depression   . Fatty liver 02/26/2019  . Fibromyalgia 06/2014  . GERD (gastroesophageal reflux disease)   . IBS (irritable bowel syndrome) 11/04/2014  . Migraine   . Obese   . Vitamin B12 deficiency     Patient Active Problem List   Diagnosis Date Noted  . Fatty liver 02/26/2019  . Increased urinary frequency 05/11/2016  . Chronic sore throat 02/17/2016  . Preventative health care 02/15/2016  . IBS (irritable bowel syndrome) 11/04/2014  . GERD (gastroesophageal reflux disease) 06/22/2014  . Fibromyalgia 06/18/2014  . 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 Seborrheic dermatitis of scalp 11/12/2013  . COSTOCHONDRITIS 02/26/2008  . Migraine headache 12/10/2007  . Anxiety state 08/22/2007  . ANEMIA, VITAMIN B12 DEFICIENCY NEC 09/06/2006    Past Surgical History:  Procedure Laterality Date  . g2 p2      OB History   No obstetric history on file.      Home Medications    Prior to Admission medications   Medication Sig Start Date End Date Taking? Authorizing Provider  ALPRAZolam Duanne Moron) 0.5 MG tablet Take 0.5 mg by mouth daily as needed.  05/13/14  Yes [provider]  cyanocobalamin (,VITAMIN B-12,) 1000 MCG/ML injection Inject 1 mL (1,000 mcg total) into the muscle every 30 (thirty)  days. 04/22/19  Yes Debbrah Alar, NP  sertraline (ZOLOFT) 50 MG tablet Take 75 mg by mouth daily.    Yes [provider]  EPINEPHrine 0.3 mg/0.3 mL IJ SOAJ injection As needed. Patient not taking: No sig reported    [provider]  mometasone (ELOCON) 0.1 % cream Apply topically as needed. Patient not taking: Reported on 04/02/2020 03/03/20   [provider]  pantoprazole (PROTONIX) 40 MG tablet TAKE 1 TABLET(40 MG) BY MOUTH DAILY Patient not taking: No sig reported 09/16/18   Debbrah Alar, NP  Syringe/Needle, Disp, (SYRINGE 3CC/20GX1-1/2") 20G X 1-1/2" 3 ML MISC Use to inject b12 03/20/17   Debbrah Alar, NP  valACYclovir (VALTREX) 1000 MG tablet As needed.    [provider]    Family History Family History  Problem Relation Age of Onset  . Diabetes Mother   . Hypertension Mother   . Cerebrovascular Accident Mother   . Hypertension Father   . Crohn's disease Father   . Colon polyps Father   . Colon cancer Paternal Grandmother   . Breast cancer Paternal Grandmother   . Crohn's disease Brother   . Anxiety disorder Sister   . Lung disease Paternal Grandfather   . Alzheimer's disease Other   . Esophageal cancer Neg Hx   . Rectal cancer Neg Hx   . Stomach cancer Neg Hx   . Prostate cancer Neg  Hx   . Pancreatic cancer Neg Hx   . Ovarian cancer Neg Hx     Social History Social History   Tobacco Use  . Smoking status: Former Smoker    Types: Cigarettes    Quit date: 04/13/2014    Years since quitting: 6.0  . Smokeless tobacco: Never Used  Vaping Use  . Vaping Use: Former  Substance Use Topics  . Alcohol use: Yes    Alcohol/week: 0.0 standard drinks    Comment: 1-2 per month  . Drug use: No     Allergies   Bee venom, Cyclobenzaprine, Moxifloxacin, Moxifloxacin hcl in nacl, Penicillins, Quinolones, Shellfish allergy, Sulfate, Sumatriptan succinate, Celexa [citalopram hydrobromide], Duloxetine, Escitalopram, Ranitidine hcl,  and Sumatriptan succinate   Review of Systems Review of Systems  As stated above in HPI  Physical Exam Triage Vital Signs ED Triage Vitals  Enc Vitals Group     BP 04/14/20 1505 140/90     Pulse Rate 04/14/20 1503 78     Resp 04/14/20 1503 16     Temp 04/14/20 1503 98.4 F (36.9 C)     Temp Source 04/14/20 1503 Oral     SpO2 04/14/20 1503 97 %     Weight 04/14/20 1506 174 lb (78.9 kg)     Height 04/14/20 1506 5\' 5"  (1.651 m)     Head Circumference --      Peak Flow --      Pain Score 04/14/20 1505 5     Pain Loc --      Pain Edu? --      Excl. in [facility]? --    No data found.  Updated Vital Signs BP 140/90 (BP Location: Right Arm)   Pulse 78   Temp 98.4 F (36.9 C) (Oral)   Resp 16   Ht 5\' 5"  (1.651 m)   Wt 174 lb (78.9 kg)   LMP 04/02/2020   SpO2 97%   BMI 28.96 kg/m   Physical Exam Vitals and nursing note reviewed.  Constitutional:      General: She is not in acute distress.    Appearance: She is well-developed. She is not ill-appearing, toxic-appearing or diaphoretic.  Eyes:     Comments: No evidence of jaundice  Abdominal:     General: Abdomen is flat. Bowel sounds are normal. There is no distension or abdominal bruit. There are no signs of injury.     Palpations: Abdomen is soft. There is no hepatomegaly, splenomegaly, mass or pulsatile mass.     Tenderness: There is no abdominal tenderness. There is no right CVA tenderness, left CVA tenderness, guarding or rebound. Negative signs include Murphy's sign and McBurney's sign.     Hernia: No hernia is present.  Skin:    General: Skin is warm and dry.     Coloration: Skin is not jaundiced.  Neurological:     Mental Status: She is alert.      UC Treatments / Results  Labs (all labs ordered are listed, but only abnormal results are displayed) Labs Reviewed  POCT URINALYSIS DIPSTICK, ED / UC    EKG   Radiology No results found.  Procedures Procedures (including critical care time)  Medications  Ordered in UC Medications - No data to display  Initial Impression / Assessment and Plan / UC Course  I have reviewed the triage vital signs and the nursing notes.  Pertinent labs & imaging results that were available during my care of the patient were reviewed by me  and considered in my medical decision making (see chart for details).     New. [place] reassuring. Gastritis vs gallstone involvement vs viral gastroenteritis vs other. Discussed with patient. Given abdominal exam findings and the fact that she is afebrile I am recommended a low to no fat bland diet, omeprazole and sucralfate. Discussed how to use along with common potential side effects and precautions. Discussed red flag signs and symptoms. Follow up with PCP recommended within 1 week if symptoms fail to resolve.    Final Clinical Impressions(s) / UC Diagnoses   Final diagnoses:  None   Discharge Instructions   None    ED Prescriptions    None     PDMP not reviewed this encounter.   Hughie Closs, [place] 04/14/20 1559

## 2020-04-15 NOTE — Telephone Encounter (Signed)
Records indicate that patient was seen at urgent care yesterday. Lvm for patient to call us if she still needs an appointment with [place] or a follow up with her provider.

## 2020-04-29 ENCOUNTER — Other Ambulatory Visit: Payer: Self-pay | Admitting: Family

## 2020-04-30 ENCOUNTER — Ambulatory Visit: Payer: BC Managed Care – PPO | Admitting: Gastroenterology

## 2020-05-05 ENCOUNTER — Other Ambulatory Visit: Payer: Self-pay

## 2020-05-05 ENCOUNTER — Ambulatory Visit (INDEPENDENT_AMBULATORY_CARE_PROVIDER_SITE_OTHER): Payer: BC Managed Care – PPO | Admitting: Family

## 2020-05-05 ENCOUNTER — Encounter: Payer: Self-pay | Admitting: Family

## 2020-05-05 VITALS — BP 119/74 | HR 82 | Temp 98.6°F | Resp 16 | Ht 65.0 in | Wt 179.0 lb

## 2020-05-05 DIAGNOSIS — Z833 Family history of diabetes mellitus: Secondary | ICD-10-CM

## 2020-05-05 DIAGNOSIS — Z0000 Encounter for general adult medical examination without abnormal findings: Secondary | ICD-10-CM | POA: Diagnosis not present

## 2020-05-05 DIAGNOSIS — R5383 Other fatigue: Secondary | ICD-10-CM | POA: Diagnosis not present

## 2020-05-05 DIAGNOSIS — K760 Fatty (change of) liver, not elsewhere classified: Secondary | ICD-10-CM

## 2020-05-05 DIAGNOSIS — E538 Deficiency of other specified B group vitamins: Secondary | ICD-10-CM | POA: Diagnosis not present

## 2020-05-05 LAB — COMPREHENSIVE METABOLIC PANEL
ALT: 15 U/L (ref 0–35)
AST: 15 U/L (ref 0–37)
Albumin: 4 g/dL (ref 3.5–5.2)
Alkaline Phosphatase: 61 U/L (ref 39–117)
BUN: 13 mg/dL (ref 6–23)
CO2: 28 mEq/L (ref 19–32)
Calcium: 9 mg/dL (ref 8.4–10.5)
Chloride: 103 mEq/L (ref 96–112)
Creatinine, Ser: 0.82 mg/dL (ref 0.40–1.20)
GFR: 88.35 mL/min (ref >60.00)
Glucose, Bld: 77 mg/dL (ref 70–99)
Potassium: 4.3 mEq/L (ref 3.5–5.1)
Sodium: 137 mEq/L (ref 135–145)
Total Bilirubin: 0.4 mg/dL (ref 0.2–1.2)
Total Protein: 7 g/dL (ref 6.0–8.3)

## 2020-05-05 LAB — CBC WITH DIFFERENTIAL/PLATELET
Basophils Absolute: 0 10*3/uL (ref 0.0–0.1)
Basophils Relative: 0.7 % (ref 0.0–3.0)
Eosinophils Absolute: 0.2 10*3/uL (ref 0.0–0.7)
Eosinophils Relative: 2.4 % (ref 0.0–5.0)
HCT: 39.8 % (ref 36.0–46.0)
Hemoglobin: 13.4 g/dL (ref 12.0–15.0)
Lymphocytes Relative: 35 % (ref 12.0–46.0)
Lymphs Abs: 2.2 10*3/uL (ref 0.7–4.0)
MCHC: 33.8 g/dL (ref 30.0–36.0)
MCV: 88.1 fl (ref 78.0–100.0)
Monocytes Absolute: 0.4 10*3/uL (ref 0.1–1.0)
Monocytes Relative: 6.8 % (ref 3.0–12.0)
Neutro Abs: 3.5 10*3/uL (ref 1.4–7.7)
Neutrophils Relative %: 55.1 % (ref 43.0–77.0)
Platelets: 340 10*3/uL (ref 150.0–400.0)
RBC: 4.51 Mil/uL (ref 3.87–5.11)
RDW: 13.4 % (ref 11.5–15.5)
WBC: 6.4 10*3/uL (ref 4.0–10.5)

## 2020-05-05 LAB — VITAMIN B12: Vitamin B-12: 370 pg/mL (ref 211–911)

## 2020-05-05 LAB — TSH: TSH: 2.21 u[IU]/mL (ref 0.35–4.50)

## 2020-05-05 NOTE — Patient Instructions (Addendum)
Please stop claritin and start zyrtec 10mg .   Complete lab work prior to leaving. Please continue your work on healthy diet, exercise and weight loss.

## 2020-05-05 NOTE — Progress Notes (Signed)
Subjective:    Patient ID: Jaclyn Tucker, female    DOB: 1976-02-13, 45 y.o.   MRN: [id number]  HPI   Patient is a 45 yr old female who presents today for cpx.  Patient presents today for complete physical.  Immunizations: declines flu shot Diet: healthy, improved Wt Readings from Last 3 Encounters:  05/05/20 179 lb (81.2 kg)  04/14/20 174 lb (78.9 kg)  04/02/20 178 lb 8 oz (81 kg)  Exercise: no much recently, trying to get back to using her exercise bike Pap Smear: sees GYN Mammogram: due  b12 deficiency- continues b12 injections.  Reports a 4 month hx of intermittent hives.  She went to an allergist and they did not find any allergies other than dust. She was placed on claritin without improvement.    Notes some fatigue and hair loss  Review of Systems  Constitutional: Negative for unexpected weight change.  HENT: Negative for facial swelling and hearing loss.   Eyes: Negative for visual disturbance.  Respiratory: Negative for cough.   Cardiovascular: Negative for palpitations.  Gastrointestinal: Negative for constipation and diarrhea.  Genitourinary: Positive for menstrual problem (heavier than they used to be 20 day cycle). Negative for dysuria, frequency and hematuria.  Musculoskeletal: Negative for arthralgias and myalgias.  Skin: Positive for rash (hives see HPI).  Neurological: Positive for headaches (occasional).  Hematological: Negative for adenopathy.   Past Medical History:  Diagnosis Date  . Anxiety   . Depression   . Fatty liver 02/26/2019  . Fibromyalgia 06/2014  . GERD (gastroesophageal reflux disease)   . IBS (irritable bowel syndrome) 11/04/2014  . Migraine   . Obese   . Vitamin B12 deficiency      Social History   Socioeconomic History  . Marital status: Married    Spouse name: Nicole Kindred  . Number of children: 2  . Years of education: Not on file  . Highest education level: 12th grade  Occupational History  . Occupation: customer service   Tobacco Use  . Smoking status: Former Smoker    Types: Cigarettes    Quit date: 04/13/2014    Years since quitting: 6.0  . Smokeless tobacco: Never Used  Vaping Use  . Vaping Use: Former  Substance and Sexual Activity  . Alcohol use: Yes    Alcohol/week: 0.0 standard drinks    Comment: 1-2 per month  . Drug use: No  . Sexual activity: Yes    Birth control/protection: None  Other Topics Concern  . Not on file  Social History Narrative   Married   2 sons 1999 and 2008   Customer relations Physiological scientist since 2006   No caffeine   11/04/2014   Pt married, lives with husband and 2 children in a one level home. Drinks one cup of coffee a day, maybe 3 sodas a week. Works out at [employer] a week.   Social Determinants of Health   Financial Resource Strain: Not on file  Food Insecurity: Not on file  Transportation Needs: Not on file  Physical Activity: Not on file  Stress: Not on file  Social Connections: Not on file  Intimate Partner Violence: Not on file    Past Surgical History:  Procedure Laterality Date  . g2 p2      Family History  Problem Relation Age of Onset  . Diabetes Mother   . Hypertension Mother   . Cerebrovascular Accident Mother   . Hypertension Father   . Crohn's disease Father   .  Colon polyps Father   . Colon cancer Paternal Grandmother   . Breast cancer Paternal Grandmother   . Crohn's disease Brother   . Anxiety disorder Sister   . Lung disease Paternal Grandfather   . Alzheimer's disease Other   . Esophageal cancer Neg Hx   . Rectal cancer Neg Hx   . Stomach cancer Neg Hx   . Prostate cancer Neg Hx   . Pancreatic cancer Neg Hx   . Ovarian cancer Neg Hx     Allergies  Allergen Reactions  . Bee Venom Swelling  . Cyclobenzaprine Swelling  . Moxifloxacin Anaphylaxis and Palpitations    REACTION: nausea REACTION: nausea REACTION: nausea  . Moxifloxacin Hcl In Nacl Anaphylaxis  . Penicillins Other (See Comments)    REACTION:  swelling REACTION: swelling Parents told her she couldn't take it  . Quinolones Anaphylaxis and Palpitations    REACTION: nausea  . Shellfish Allergy Rash  . Sulfate Rash  . Sumatriptan Succinate     Other reaction(s): Dizziness, Other REACTION: nausea, dizziness, panic symptoms REACTION: nausea, dizziness, panic symptoms  . Celexa [Citalopram Hydrobromide] Other (See Comments)    Panic attacks  . Duloxetine Other (See Comments)    Panic attacks  . Escitalopram Other (See Comments)    Panic attacks  . Ranitidine Hcl     Other reaction(s): Other (See Comments) uknown  . Sumatriptan Succinate     REACTION: nausea, dizziness, panic symptoms    Current Outpatient Medications on File Prior to Visit  Medication Sig Dispense Refill  . ALPRAZolam (XANAX) 0.5 MG tablet Take 0.5 mg by mouth daily as needed.     . cyanocobalamin (,VITAMIN B-12,) 1000 MCG/ML injection Inject 1 mL (1,000 mcg total) into the muscle every 30 (thirty) days. 3 mL 4  . EPINEPHrine 0.3 mg/0.3 mL IJ SOAJ injection As needed.    Marland Kitchen omeprazole (PRILOSEC) 20 MG capsule Take 1 capsule (20 mg total) by mouth daily. 14 capsule 0  . sertraline (ZOLOFT) 50 MG tablet Take 75 mg by mouth daily.     . sucralfate (CARAFATE) 1 g tablet Take 1 tablet (1 g total) by mouth 4 (four) times daily -  with meals and at bedtime. 30 tablet 0  . Syringe/Needle, Disp, (SYRINGE 3CC/20GX1-1/2") 20G X 1-1/2" 3 ML MISC Use to inject b12 12 each 0  . valACYclovir (VALTREX) 1000 MG tablet As needed.    . [DISCONTINUED] pantoprazole (PROTONIX) 40 MG tablet TAKE 1 TABLET(40 MG) BY MOUTH DAILY (Patient not taking: No sig reported) 30 tablet 3   No current facility-administered medications on file prior to visit.    BP 119/74 (BP Location: Right Arm, Patient Position: Sitting, Cuff Size: Small)   Pulse 82   Temp 98.6 F (37 C) (Oral)   Resp 16   Ht 5\' 5"  (1.651 m)   Wt 179 lb (81.2 kg)   SpO2 100%   BMI 29.79 kg/m        Objective:    Physical Exam  Physical Exam  Constitutional: She is oriented to person, place, and time. She appears well-developed and well-nourished. No distress.  HENT:  Head: Normocephalic and atraumatic.  Right Ear: Tympanic membrane and ear canal normal.  Left Ear: Tympanic membrane and ear canal normal.  Mouth/Throat: Not examined- pt wearing mask Eyes: Pupils are equal, round, and reactive to light. No scleral icterus.  Neck: Normal range of motion. No thyromegaly present.  Cardiovascular: Normal rate and regular rhythm.   No murmur heard.  Pulmonary/Chest: Effort normal and breath sounds normal. No respiratory distress. He has no wheezes. She has no rales. She exhibits no tenderness.  Abdominal: Soft. Bowel sounds are normal. She exhibits no distension and no mass. There is no tenderness. There is no rebound and no guarding.  Musculoskeletal: She exhibits no edema.  Lymphadenopathy:    She has no cervical adenopathy.  Neurological: She is alert and oriented to person, place, and time. She has normal patellar reflexes. She exhibits normal muscle tone. Coordination normal.  Skin: Skin is warm and dry.  Psychiatric: She has a normal mood and affect. Her behavior is normal. Judgment and thought content normal.  Breast/pelvic: deferred     Assessment & Plan:   Preventative care- discussed healthy diet, exercise and weight loss. Refer for mammogram. Pap per GYN.  Declines flu shot, other labs are up to date.   b12 deficiency- continue monthly b12 shots.  Shot given today.   Fatigue/hair loss- check cbc, tsh.    This visit occurred during the SARS-CoV-2 public health emergency.  Safety protocols were in place, including screening questions prior to the visit, additional usage of staff PPE, and extensive cleaning of exam room while observing appropriate contact time as indicated for disinfecting solutions.         Assessment & Plan:

## 2020-05-06 ENCOUNTER — Ambulatory Visit (HOSPITAL_BASED_OUTPATIENT_CLINIC_OR_DEPARTMENT_OTHER)
Admission: RE | Admit: 2020-05-06 | Discharge: 2020-05-06 | Disposition: A | Discharge status: Home / Self Care | Payer: BC Managed Care – PPO | Source: Ambulatory Visit | Attending: Family | Admitting: Family

## 2020-05-06 DIAGNOSIS — Z1231 Encounter for screening mammogram for malignant neoplasm of breast: Secondary | ICD-10-CM | POA: Diagnosis not present

## 2020-05-06 DIAGNOSIS — Z0000 Encounter for general adult medical examination without abnormal findings: Secondary | ICD-10-CM

## 2020-05-19 DIAGNOSIS — F411 Generalized anxiety disorder: Secondary | ICD-10-CM | POA: Diagnosis not present

## 2020-05-19 DIAGNOSIS — F410 Panic disorder [episodic paroxysmal anxiety] without agoraphobia: Secondary | ICD-10-CM | POA: Diagnosis not present

## 2020-05-24 ENCOUNTER — Encounter: Payer: Self-pay | Admitting: Family

## 2020-05-24 NOTE — Telephone Encounter (Signed)
Advised patient to make an appointment

## 2020-05-26 DIAGNOSIS — L309 Dermatitis, unspecified: Secondary | ICD-10-CM | POA: Diagnosis not present

## 2020-09-01 DIAGNOSIS — F410 Panic disorder [episodic paroxysmal anxiety] without agoraphobia: Secondary | ICD-10-CM | POA: Diagnosis not present

## 2020-09-01 DIAGNOSIS — F411 Generalized anxiety disorder: Secondary | ICD-10-CM | POA: Diagnosis not present

## 2020-09-24 DIAGNOSIS — Z6831 Body mass index (BMI) 31.0-31.9, adult: Secondary | ICD-10-CM | POA: Diagnosis not present

## 2020-09-24 DIAGNOSIS — Z01419 Encounter for gynecological examination (general) (routine) without abnormal findings: Secondary | ICD-10-CM | POA: Diagnosis not present

## 2020-12-31 ENCOUNTER — Other Ambulatory Visit: Payer: Self-pay

## 2020-12-31 ENCOUNTER — Ambulatory Visit
Admission: RE | Admit: 2020-12-31 | Discharge: 2020-12-31 | Disposition: A | Discharge status: Home / Self Care | Payer: BC Managed Care – PPO | Source: Ambulatory Visit | Attending: Emergency Medicine | Admitting: Emergency Medicine

## 2020-12-31 ENCOUNTER — Ambulatory Visit (HOSPITAL_BASED_OUTPATIENT_CLINIC_OR_DEPARTMENT_OTHER)
Admission: RE | Admit: 2020-12-31 | Discharge: 2020-12-31 | Disposition: A | Discharge status: Home / Self Care | Payer: BC Managed Care – PPO | Source: Ambulatory Visit | Attending: Emergency Medicine | Admitting: Emergency Medicine

## 2020-12-31 VITALS — BP 128/86 | HR 81 | Temp 98.2°F | Resp 20

## 2020-12-31 DIAGNOSIS — R0602 Shortness of breath: Secondary | ICD-10-CM | POA: Insufficient documentation

## 2020-12-31 DIAGNOSIS — Z8616 Personal history of COVID-19: Secondary | ICD-10-CM | POA: Insufficient documentation

## 2020-12-31 DIAGNOSIS — U099 Post covid-19 condition, unspecified: Secondary | ICD-10-CM

## 2020-12-31 NOTE — ED Provider Notes (Signed)
[facility]    CSN: [id number] Arrival date & time: 12/31/20  1036      History   Chief Complaint Chief Complaint  Patient presents with   Shortness of Breath   APPT 10:45    HPI Jaclyn Tucker is a 45 y.o. female.   Pt reports having Covid back in august, and she feels like since then she had been getting winded with activity.  Patient states that when she is exerting herself such as when climbing bleachers about all games, exercise, getting up in the morning causes of shortness of breath to occur, denies shortness of breath at rest, cough, increased work of breathing, history of asthma or COPD, patient does endorse a history of smoking but states she quit several years ago.  At this time, patient does not appear to be in any respiratory distress.  Patient states that she is going out of town to the end of the week with her daughter to [place], patient states she has some anxiety about her health and would like some reassurance that she is not have permanent damage to her lungs from the COVID infection.  Patient states she try to get an apartment with her primary care provider but the next available was 2 months from now.  Patient is requesting a chest x-ray today.  The history is provided by the patient.   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      Home Medications    Prior to Admission medications   Medication Sig Start Date End Date Taking? Authorizing Provider  ALPRAZolam Duanne Moron) 0.5 MG tablet Take 0.5 mg by mouth daily as needed.  05/13/14   [provider]  cyanocobalamin (,VITAMIN B-12,) 1000 MCG/ML injection Inject 1 mL (1,000 mcg total) into the muscle every 30 (thirty) days. 04/30/20   Debbrah Alar, NP  EPINEPHrine 0.3 mg/0.3 mL IJ SOAJ injection As needed.    [provider]  omeprazole (PRILOSEC) 20 MG capsule Take 1 capsule (20 mg total) by mouth daily. 04/14/20   Hughie Closs, PA-C  sertraline (ZOLOFT) 50 MG tablet Take 75 mg by mouth daily.     [provider]  sucralfate (CARAFATE) 1 g tablet Take 1 tablet (1 g total) by mouth 4 (four) times daily -  with meals and at bedtime. 04/14/20   Hughie Closs, PA-C  Syringe/Needle, Disp, (SYRINGE 3CC/20GX1-1/2") 20G X 1-1/2" 3 ML MISC Use to inject b12 03/20/17   Debbrah Alar, NP  valACYclovir (VALTREX) 1000 MG tablet As needed.    [provider]  pantoprazole (PROTONIX) 40 MG tablet TAKE 1 TABLET(40 MG) BY MOUTH DAILY Patient not taking: No sig reported 09/16/18 04/14/20  Debbrah Alar, NP    Family History Family History  Problem Relation Age of Onset   Diabetes Mother    Hypertension Mother    Cerebrovascular Accident Mother    Hypertension Father    Crohn's disease Father    Colon polyps Father    Colon cancer Paternal Grandmother    Breast cancer Paternal Grandmother    Crohn's disease Brother    Anxiety disorder Sister    Lung disease Paternal Grandfather    Alzheimer's disease Other    Esophageal cancer Neg Hx    Rectal cancer Neg Hx    Stomach cancer Neg Hx    Prostate cancer  Neg Hx    Pancreatic cancer Neg Hx    Ovarian cancer Neg Hx     Social History Social History   Tobacco Use   Smoking status: Former    Types: Cigarettes    Quit date: 04/13/2014    Years since quitting: 6.7   Smokeless tobacco: Never  Vaping Use   Vaping Use: Former  Substance Use Topics   Alcohol use: Yes    Alcohol/week: 0.0 standard drinks    Comment: 1-2 per month   Drug use: No     Allergies   Bee venom, Cyclobenzaprine, Moxifloxacin, Moxifloxacin hcl in nacl, Penicillins, Quinolones, Shellfish allergy, Sulfate, Sumatriptan succinate, Celexa [citalopram hydrobromide], Duloxetine, Escitalopram, Ranitidine hcl, and Sumatriptan succinate   Review of Systems Review of Systems Pertinent findings noted in history of present illness.    Physical Exam Triage Vital Signs ED Triage Vitals  Enc Vitals Group     BP      Pulse      Resp      Temp      Temp src      SpO2      Weight      Height      Head Circumference      Peak Flow      Pain Score      Pain Loc      Pain Edu?      Excl. in [place]?    No data found.  Updated Vital Signs BP 128/86 (BP Location: Left Arm)   Pulse 81   Temp 98.2 F (36.8 C) (Oral)   Resp 20   LMP 12/06/2020   SpO2 98%   Visual Acuity Right Eye Distance:   Left Eye Distance:   Bilateral Distance:    Right Eye Near:   Left Eye Near:    Bilateral Near:     Physical Exam Vitals and nursing note reviewed.  Constitutional:      Appearance: Normal appearance.  HENT:     Head: Normocephalic and atraumatic.     Right Ear: Tympanic membrane, ear canal and external ear normal.     Left Ear: Tympanic membrane, ear canal and external ear normal.     Nose: Nose normal.     Mouth/Throat:     Mouth: Mucous membranes are moist.     Pharynx: Oropharynx is clear.  Eyes:     Extraocular Movements: Extraocular movements intact.     Conjunctiva/sclera: Conjunctivae normal.     Pupils: Pupils are equal, round, and reactive to light.   Cardiovascular:     Rate and Rhythm: Normal rate and regular rhythm.     Pulses: Normal pulses.  Heart sounds: Normal heart sounds.  Pulmonary:     Effort: Pulmonary effort is normal.     Breath sounds: Normal breath sounds.  Abdominal:     General: Abdomen is flat. Bowel sounds are normal.     Palpations: Abdomen is soft.  Musculoskeletal:        General: Normal range of motion.     Cervical back: Normal range of motion and neck supple.  Skin:    General: Skin is warm and dry.  Neurological:     General: No focal deficit present.     Mental Status: She is alert and oriented to person, place, and time. Mental status is at baseline.  Psychiatric:        Mood and Affect: Mood normal.        Behavior: Behavior normal.     UC Treatments / Results  Labs (all labs ordered are listed, but only abnormal results are displayed) Labs Reviewed - No data to display  EKG   Radiology No results found.  Procedures Procedures (including critical care time)  Medications Ordered in UC Medications - No data to display  Initial Impression / Assessment and Plan / UC Course  I have reviewed the triage vital signs and the nursing notes.  Pertinent labs & imaging results that were available during my care of the patient were reviewed by me and considered in my medical decision making (see chart for details).     Physical exam is unremarkable today.  Patient admits to having a certain level of anxiety about her health, states she just does not feel comfortable going to [place] without some reassurance that her lung function is normal.  Patient I discussed pulmonary function testing is a great way to definitively diagnose damage to the lungs, I recommend that she follow-up with her primary care physician as soon as possible to request referral.  In the meantime, chest x-ray has been ordered at her request, patient will be notified of results once they are received.  Patient verbalized  understanding and agreement of plan as discussed.  All questions were addressed during visit.  Please see discharge instructions below for further details of plan.  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  The radiologist usually provides a report within an hour after x-ray is done.  I have given myself a reminder to call you before the end of business day to let you know what the x-ray showed.  In the meantime as we discussed, please work on increasing your endurance by increasing your exercise duration and intensity.  Keep in mind that your body will always rise to the demand that you placed on it.  I have you enjoy your trip to [place] with your daughter.  Thank you for visiting urgent care today and allowing me to be part of your care.     ED Prescriptions   None    PDMP not reviewed this encounter.   Lynden Oxford Scales, PA-C 12/31/20 1141

## 2020-12-31 NOTE — Discharge Instructions (Signed)
I placed an order for you to have a chest x-ray done at the [facility].  The radiologist usually provides a report within an hour after x-ray is done.  I have given myself a reminder to call you before the end of business day to let you know what the x-ray showed.  In the meantime as we discussed, please work on increasing your endurance by increasing your exercise duration and intensity.  Keep in mind that your body will always rise to the demand that you placed on it.  I have you enjoy your trip to [place] with your daughter.  Thank you for visiting urgent care today and allowing me to be part of your care.

## 2020-12-31 NOTE — ED Triage Notes (Signed)
Pt reports having Covid back in august, and she feels like since then she had been getting winded with activity. She states it is worse in the morning. Patient does not display s/s of respiratory distress at this time.

## 2021-01-28 ENCOUNTER — Encounter: Payer: Self-pay | Admitting: Family

## 2021-01-28 MED ORDER — PANTOPRAZOLE SODIUM 40 MG PO TBEC: DELAYED_RELEASE_TABLET | ORAL | 3 refills | Status: DC

## 2021-02-24 DIAGNOSIS — F410 Panic disorder [episodic paroxysmal anxiety] without agoraphobia: Secondary | ICD-10-CM | POA: Diagnosis not present

## 2021-02-24 DIAGNOSIS — F411 Generalized anxiety disorder: Secondary | ICD-10-CM | POA: Diagnosis not present

## 2021-04-15 ENCOUNTER — Telehealth: Payer: Self-pay | Admitting: Family

## 2021-04-15 ENCOUNTER — Encounter: Payer: Self-pay | Admitting: Family Medicine

## 2021-04-15 ENCOUNTER — Ambulatory Visit: Payer: BC Managed Care – PPO | Admitting: Family Medicine

## 2021-04-15 VITALS — BP 118/86 | HR 79 | Temp 98.4°F | Ht 65.0 in | Wt 196.0 lb

## 2021-04-15 DIAGNOSIS — R002 Palpitations: Secondary | ICD-10-CM

## 2021-04-15 LAB — CBC
HCT: 39.7 % (ref 36.0–46.0)
Hemoglobin: 13.2 g/dL (ref 12.0–15.0)
MCHC: 33.2 g/dL (ref 30.0–36.0)
MCV: 87.7 fl (ref 78.0–100.0)
Platelets: 320 10*3/uL (ref 150.0–400.0)
RBC: 4.52 Mil/uL (ref 3.87–5.11)
RDW: 13.5 % (ref 11.5–15.5)
WBC: 7.1 10*3/uL (ref 4.0–10.5)

## 2021-04-15 LAB — COMPREHENSIVE METABOLIC PANEL
ALT: 16 U/L (ref 0–35)
AST: 14 U/L (ref 0–37)
Albumin: 4.1 g/dL (ref 3.5–5.2)
Alkaline Phosphatase: 60 U/L (ref 39–117)
BUN: 15 mg/dL (ref 6–23)
CO2: 25 mEq/L (ref 19–32)
Calcium: 9.1 mg/dL (ref 8.4–10.5)
Chloride: 106 mEq/L (ref 96–112)
Creatinine, Ser: 0.83 mg/dL (ref 0.40–1.20)
GFR: 86.5 mL/min (ref >60.00)
Glucose, Bld: 87 mg/dL (ref 70–99)
Potassium: 4.1 mEq/L (ref 3.5–5.1)
Sodium: 137 mEq/L (ref 135–145)
Total Bilirubin: 0.4 mg/dL (ref 0.2–1.2)
Total Protein: 7.1 g/dL (ref 6.0–8.3)

## 2021-04-15 LAB — TSH: TSH: 3.12 u[IU]/mL (ref 0.35–5.50)

## 2021-04-15 NOTE — Telephone Encounter (Signed)
urse Assessment Nurse: [name], RN, [facility] Date/Time (Eastern Time): 04/15/2021 9:58:17 AM Confirm and document reason for call. If symptomatic, describe symptoms. ---Caller states yesterday she had possible panic attack Her watch told her she had a fibrillation and it felt like her heart dropped instead of palpitations . She staes it was different than usual. did have some dizziness not s/s this morning no cardiac history no fever no cold s/s Does the patient have any new or worsening symptoms? ---Yes Will a triage be completed? ---Yes Related visit to physician within the last 2 weeks? ---No Does the PT have any chronic conditions? (i.e. diabetes, asthma, this includes High risk factors for pregnancy, etc.) ---Yes List chronic conditions. ---anxiety, fibormyalgia, migraines Is the patient pregnant or possibly pregnant? (Ask all females between the ages of 11-55) ---No Is this a behavioral health or substance abuse call? ---No Guidelines Guideline Title Affirmed Question Affirmed Notes Nurse Date/Time (Eastern Time) Anxiety and Panic Attack [1] Symptoms of anxiety or panic attack AND [2] is [name], Therapist, sports, [facility] 04/15/2021 10:01:25 AM PLEASE NOTE: All timestamps contained within this report are represented as [place] Standard Time. CONFIDENTIALTY NOTICE: This fax transmission is intended only for the addressee. It contains information that is legally privileged, confidential or otherwise protected from use or disclosure. If you are not the intended recipient, you are strictly prohibited from reviewing, disclosing, copying using or disseminating any of this information or taking any action in reliance on or regarding this information. If you have received this fax in error, please notify us immediately by telephone so that we can arrange for its return to [place]. Phone: [phone], Toll-Free: [phone], Fax: [phone] Page: 2 of 2 Call Id: [id number] Guidelines Guideline Title Affirmed  Question Affirmed Notes Nurse Date/Time Jaclyn Tucker Time) a chronic symptom (recurrent or ongoing AND present > 4 weeks) Disp. Time Jaclyn Tucker Time) Disposition Final User 04/15/2021 10:05:13 AM See PCP within 2 Weeks Yes Dalton, RN, [facility] Caller Disagree/Comply Comply Caller Understands Yes PreDisposition Call Doctor Care Advice Given Per Guideline SEE PCP WITHIN 2 WEEKS: * You need to be seen for this ongoing problem within the next 2 weeks. * PCP VISIT: Call your doctor (or NP/PA) during regular office hours and make an appointment. * IF PATIENT HAS NO PCP: A primary care clinic is where you need to be seen for chronic health problems. NOTE: Try to help caller find a PCP (e.g., use a physician referral line). Having a PCP or 'medical home' means better long-term care. REASSURANCE AND EDUCATION - ANXIETY: * Anxiety is a normal reaction to a stressful situation. * Anxiety and panic attacks can be treated successfully. * Support of family friends, clergy (etc.) is important. * Here is some care advice that should help. NOTE TO TRIAGER - PANIC ATTACK: * Reassure the patient that panic attacks are not harmful. Acknowledge how scary this must feel. * Ask them what they have done in the past that has helped. * Speak calmly, encourage the patient to take slow deep breaths of air using diaphragmatic breathing (abdominal breathing). Take 1 breath every 5 seconds (12 breaths a minute). By focusing the patient on controlling their breathing, the patient is taking control of the panic attack. * Suggest a calming activity (e.g., going for a walk, listening to music, taking a warm bath/shower). HEALTHY LIVING BASICS: * There are things you can do to feel better. * Avoid alcohol: * Drink adequate fluids: Drink 6 to 8 cups (1,500 to 2,000 ml) of water each day. *  Eat healthy: Eat a well-balanced diet. * Exercise regularly: Take a daily walk. Regular exercise will improve your overall health, improve your mood,  and is a simple method to reduce stress. * Get more sleep: Most people need 7 to 8 hours of sleep each night. Being well-rested improves your mood and your sense of well-being. * Talk with Friends and Family: Share how you are feeling with someone. Make sure that your spouse, family, or friends know how you are feeling. AVOID CAFFEINE: * Avoid caffeine-containing beverages. Reason: It is a stimulant and can aggravate anxiety. * Examples include coffee, tea, colas. AVOID TRIGGERS OF ANXIETY: * Limit your alcohol consumption to no more than 2 drinks a day. After dinner drinking causes insomnia 3-4 hours after falling asleep. * For smokers - Stop or reduce your smoking. Reason: nicotine is a stimulant. * Avoid diet pills. Reason: they act as stimulants. * Talk to your doctor before taking any herbal supplements. Reason: some have side effects. STRESS REDUCTION: * Try a calming activity. Here are some examples: go for a daily walk, spend time with friends or family, read a book. * Take regular breaks throughout the day. * Occasionally pamper yourself. * Take a class on stress management. * Learn relaxation technique such as meditation or yoga. CALL BACK IF: * You feel like harming yourself * You become worse CARE ADVICE given per Anxiety and Panic Attack (Adult) guideline. Referrals REFERRED TO PCP OFFICE  Appt scheduled w/ Dr Nani Ravens.

## 2021-04-15 NOTE — Telephone Encounter (Signed)
Pt stated she felt she was having anxiety attack yesterday but it lasted much longer than usual. When she tracked her heart rate on her watch it kept coming up as afib. She was unsure what to do. Transferred to triage to go over symptoms. Please advise.

## 2021-04-15 NOTE — Patient Instructions (Signed)
Give [place] 2-3 business days to get the results of your labs back.   Let me know if you want to see a heart doctor.  The rhythm you showed me is not atrial fibrillation.  Let us know if you need anything.

## 2021-04-15 NOTE — Progress Notes (Signed)
Chief Complaint  Patient presents with   light headed    Felt like a panic attack    Jaclyn Tucker is a 46 y.o. female here for palpitations. Here w spouse.   Length of issue: 1 day How long does it last:  20  minutes Light headedness/passing out? Yes Chest pain/shortness of breath? No Hx of arrhythmia? No Medication changes/illicit substances? No Resembled panic attack, never lasted that long before.  Past Medical History:  Diagnosis Date   Anxiety    Depression    Fatty liver 02/26/2019   Fibromyalgia 06/2014   GERD (gastroesophageal reflux disease)    IBS (irritable bowel syndrome) 11/04/2014   Migraine    Obese    Vitamin B12 deficiency    Past Surgical History:  Procedure Laterality Date   g2 p2     Allergies  Allergen Reactions   Bee Venom Swelling   Cyclobenzaprine Swelling   Moxifloxacin Anaphylaxis and Palpitations    REACTION: nausea REACTION: nausea REACTION: nausea   Moxifloxacin Hcl In Nacl Anaphylaxis   Penicillins Other (See Comments)    REACTION: swelling REACTION: swelling Parents told her she couldn't take it   Quinolones Anaphylaxis and Palpitations    REACTION: nausea   Shellfish Allergy Rash   Sulfate Rash   Sumatriptan Succinate     Other reaction(s): Dizziness, Other REACTION: nausea, dizziness, panic symptoms REACTION: nausea, dizziness, panic symptoms   Celexa [Citalopram Hydrobromide] Other (See Comments)    Panic attacks   Duloxetine Other (See Comments)    Panic attacks   Escitalopram Other (See Comments)    Panic attacks   Ranitidine Hcl     Other reaction(s): Other (See Comments) uknown   Sumatriptan Succinate     REACTION: nausea, dizziness, panic symptoms   Allergies as of 04/15/2021       Reactions   Bee Venom Swelling   Cyclobenzaprine Swelling   Moxifloxacin Anaphylaxis, Palpitations   REACTION: nausea REACTION: nausea REACTION: nausea   Moxifloxacin Hcl In [place]acl Anaphylaxis   Penicillins Other (See Comments)    REACTION: swelling REACTION: swelling Parents told her she couldn't take it   Quinolones Anaphylaxis, Palpitations   REACTION: nausea   Shellfish Allergy Rash   Sulfate Rash   Sumatriptan Succinate    Other reaction(s): Dizziness, Other REACTION: nausea, dizziness, panic symptoms REACTION: nausea, dizziness, panic symptoms   Celexa [citalopram Hydrobromide] Other (See Comments)   Panic attacks   Duloxetine Other (See Comments)   Panic attacks   Escitalopram Other (See Comments)   Panic attacks   Ranitidine Hcl    Other reaction(s): Other (See Comments) uknown   Sumatriptan Succinate    REACTION: nausea, dizziness, panic symptoms        Medication List        Accurate as of April 15, 2021 11:57 AM. If you have any questions, ask your nurse or doctor.          ALPRAZolam 0.5 MG tablet Commonly known as: XANAX Take 0.5 mg by mouth daily as needed.   cyanocobalamin 1000 MCG/ML injection Commonly known as: (VITAMIN B-12) Inject 1 mL (1,000 mcg total) into the muscle every 30 (thirty) days.   EPINEPHrine 0.3 mg/0.3 mL Soaj injection Commonly known as: EPI-PEN As needed.   omeprazole 20 MG capsule Commonly known as: PRILOSEC Take 1 capsule (20 mg total) by mouth daily.   pantoprazole 40 MG tablet Commonly known as: PROTONIX TAKE 1 TABLET(40 MG) BY MOUTH DAILY   sertraline 50 MG  tablet Commonly known as: ZOLOFT Take 75 mg by mouth daily.   SYRINGE 3CC/20GX1-1/2" 20G X 1-1/2" 3 ML Misc Use to inject b12   valACYclovir 1000 MG tablet Commonly known as: VALTREX As needed.        BP 118/86    Pulse 79    Temp 98.4 F (36.9 C) (Oral)    Ht 5\' 5"  (1.651 m)    Wt 196 lb (88.9 kg)    SpO2 99%    BMI 32.62 kg/m  Gen: Awake, alert, appearing stated age Eyes: PERRLA Mouth: MMM Heart: RRR, no bruits, no LE edema Lungs: CTAB, no accessory muscle use Neuro: No cerebellar signs MSK: No muscle group atrophy or asymmetry Psych: Age appropriate judgment and  insight, mood/affect WNL  Palpitations - Plan: CBC, Comprehensive metabolic panel, TSH  New, uncertain prog. Ck labs. Reviewed rhythm strip, not A fib, was NSR with wandering baseline. Reassured. She has no risk factors. Offered cards referral, will hold off for now.  The patient and spouse voiced understanding and agreement to the plan.  Jaclyn Tucker 11:57 AM 04/15/21

## 2021-04-15 NOTE — Telephone Encounter (Signed)
Seen by Dr. Nani Ravens

## 2021-05-17 ENCOUNTER — Ambulatory Visit (HOSPITAL_BASED_OUTPATIENT_CLINIC_OR_DEPARTMENT_OTHER): Payer: BC Managed Care – PPO | Admitting: Nurse Practitioner

## 2021-05-17 ENCOUNTER — Other Ambulatory Visit: Payer: Self-pay

## 2021-05-17 ENCOUNTER — Other Ambulatory Visit (HOSPITAL_BASED_OUTPATIENT_CLINIC_OR_DEPARTMENT_OTHER): Payer: Self-pay | Admitting: Nurse Practitioner

## 2021-05-17 ENCOUNTER — Encounter (HOSPITAL_BASED_OUTPATIENT_CLINIC_OR_DEPARTMENT_OTHER): Payer: Self-pay | Admitting: Nurse Practitioner

## 2021-05-17 ENCOUNTER — Telehealth (HOSPITAL_BASED_OUTPATIENT_CLINIC_OR_DEPARTMENT_OTHER): Payer: Self-pay | Admitting: Nurse Practitioner

## 2021-05-17 VITALS — BP 117/72 | HR 96 | Ht 65.0 in | Wt 194.2 lb

## 2021-05-17 DIAGNOSIS — R5383 Other fatigue: Secondary | ICD-10-CM | POA: Diagnosis not present

## 2021-05-17 DIAGNOSIS — L700 Acne vulgaris: Secondary | ICD-10-CM | POA: Insufficient documentation

## 2021-05-17 DIAGNOSIS — Z0000 Encounter for general adult medical examination without abnormal findings: Secondary | ICD-10-CM

## 2021-05-17 DIAGNOSIS — R002 Palpitations: Secondary | ICD-10-CM | POA: Diagnosis not present

## 2021-05-17 DIAGNOSIS — J301 Allergic rhinitis due to pollen: Secondary | ICD-10-CM | POA: Insufficient documentation

## 2021-05-17 DIAGNOSIS — N898 Other specified noninflammatory disorders of vagina: Secondary | ICD-10-CM

## 2021-05-17 DIAGNOSIS — R635 Abnormal weight gain: Secondary | ICD-10-CM

## 2021-05-17 DIAGNOSIS — M797 Fibromyalgia: Secondary | ICD-10-CM | POA: Diagnosis not present

## 2021-05-17 DIAGNOSIS — D531 Other megaloblastic anemias, not elsewhere classified: Secondary | ICD-10-CM

## 2021-05-17 DIAGNOSIS — Z91013 Allergy to seafood: Secondary | ICD-10-CM | POA: Insufficient documentation

## 2021-05-17 DIAGNOSIS — R6882 Decreased libido: Secondary | ICD-10-CM | POA: Diagnosis not present

## 2021-05-17 DIAGNOSIS — K219 Gastro-esophageal reflux disease without esophagitis: Secondary | ICD-10-CM

## 2021-05-17 DIAGNOSIS — R790 Abnormal level of blood mineral: Secondary | ICD-10-CM

## 2021-05-17 DIAGNOSIS — Z6834 Body mass index (BMI) 34.0-34.9, adult: Secondary | ICD-10-CM | POA: Insufficient documentation

## 2021-05-17 DIAGNOSIS — Z1231 Encounter for screening mammogram for malignant neoplasm of breast: Secondary | ICD-10-CM

## 2021-05-17 DIAGNOSIS — E559 Vitamin D deficiency, unspecified: Secondary | ICD-10-CM

## 2021-05-17 MED ORDER — KETOCONAZOLE 2 % EX SHAM
1.0000 | MEDICATED_SHAMPOO | CUTANEOUS | 0 refills | Status: DC
Start: 2021-05-19 — End: 2022-06-13

## 2021-05-17 MED ORDER — "SYRINGE 20G X 1-1/2"" 3 ML MISC": 6 refills | Status: AC

## 2021-05-17 MED ORDER — CYANOCOBALAMIN 1000 MCG/ML IJ SOLN: 1000.0000 ug | INTRAMUSCULAR | 4 refills | Status: DC

## 2021-05-17 MED ORDER — SPIRONOLACTONE 25 MG PO TABS: 25.0000 mg | ORAL_TABLET | Freq: Every morning | ORAL | 3 refills | Status: DC

## 2021-05-17 MED ORDER — OMEPRAZOLE 40 MG PO CPDR: 40.0000 mg | DELAYED_RELEASE_CAPSULE | Freq: Every day | ORAL | 5 refills | Status: DC

## 2021-05-17 MED ORDER — ESTRADIOL 0.1 MG/GM VA CREA: TOPICAL_CREAM | VAGINAL | 5 refills | Status: DC

## 2021-05-17 NOTE — Assessment & Plan Note (Signed)
Intermittent. Recent w/u negative for cardiac concerns.  ?Consider possible thyroid or deficiency could be contributing.  ?Will monitor labs today. Mildly tachy today with regular rhythm. No alarm sx present.  ?Will make changes to plan of care based on labs.  ?Call the office for evaluation if sx worsen or fail to improve.  ?

## 2021-05-17 NOTE — Patient Instructions (Addendum)
Thank you for choosing [place] [facility] for your Primary Care needs. I am excited for the opportunity to partner with you to meet your health care goals. It was a pleasure meeting you today!  Recommendations from today's visit: Try rhinocort or nasocort (if you have a dryer nose) 2 sprays in each nostril twice a day for 4 weeks to see if this helps with the pressure in the ear. If this works, continue using. (If you have a runnier nose, you can use Flonase).  I recommend using the nizoral shampoo to wash your face and back 2-3 times a week to help with acne. I have also sent the spironolactone to help with this.  I have sent in the vaginal cream to help with the vaginal dryness. This can be used with a pea size applied to the vaginal opening nightly. You may want to start with a full applicator inside the vagina for day one and day 4 with the pea sized amount in between those days and after to help get this in your system faster.  I will let you know what the labs show and we can make any changes based on these findings.   Information on diet, exercise, and health maintenance recommendations are listed below. This is information to help you be sure you are on track for optimal health and monitoring.   Please look over this and let us know if you have any questions or if you have completed any of the health maintenance outside of [place] so that we can be sure your records are up to date.  ___________________________________________________________ About Me: I am an Adult-Geriatric Nurse Practitioner with a background in caring for patients for more than 20 years with a strong intensive care background. I provide primary care and sports medicine services to patients age 23 and older within this office. My education had a strong focus on caring for the older adult population, which I am passionate about. I am also the director of the APP Fellowship with [facility].    My desire is to provide you with the best service through preventive medicine and supportive care. I consider you a part of the medical team and value your input. I work diligently to ensure that you are heard and your needs are met in a safe and effective manner. I want you to feel comfortable with me as your provider and want you to know that your health concerns are important to me.  For your information, our office hours are: Monday, Tuesday, and Thursday 8:00 AM - 5:00 PM Wednesday and Friday 8:00 AM - 12:00 PM.   In my time away from the office I am teaching new APP's within the system and am unavailable, but my partner, Dr. Burnard Bunting is in the office for emergent needs.   If you have questions or concerns, please call our office at [phone] or send [place] a MyChart message and we will respond as quickly as possible.  ____________________________________________________________ MyChart:  For all urgent or time sensitive needs we ask that you please call the office to avoid delays. Our number is [phone]. MyChart is not constantly monitored and due to the large volume of messages a day, replies may take up to 72 business hours.  MyChart Policy: MyChart allows for you to see your visit notes, after visit summary, provider recommendations, lab and tests results, make an appointment, request refills, and contact your provider or the office for non-urgent questions or  concerns. Providers are seeing patients during normal business hours and do not have built in time to review MyChart messages.  We ask that you allow a minimum of 3 business days for responses to [employer]. For this reason, please do not send urgent requests through [place]. Please call the office at [phone]. New and ongoing conditions may require a visit. We have virtual and in person visit available for your convenience.  Complex MyChart concerns may require a visit. Your provider may request you schedule a  virtual or in person visit to ensure we are providing the best care possible. MyChart messages sent after 11:00 AM on Friday will not be received by the provider until Monday morning.    Lab and Test Results: You will receive your lab and test results on MyChart as soon as they are completed and results have been sent by the lab or testing facility. Due to this service, you will receive your results BEFORE your provider.  I review lab and tests results each morning prior to seeing patients. Some results require collaboration with other providers to ensure you are receiving the most appropriate care. For this reason, we ask that you please allow a minimum of 3-5 business days from the time the ALL results have been received for your provider to receive and review lab and test results and contact you about these.  Most lab and test result comments from the provider will be sent through [place]. Your provider may recommend changes to the plan of care, follow-up visits, repeat testing, ask questions, or request an office visit to discuss these results. You may reply directly to this message or call the office at [phone] to provide information for the provider or set up an appointment. In some instances, you will be called with test results and recommendations. Please let us know if this is preferred and we will make note of this in your chart to provide this for you.    If you have not heard a response to your lab or test results in 5 business days from all results returning to [place], please call the office to let us know. We ask that you please avoid calling prior to this time unless there is an emergent concern. Due to high call volumes, this can delay the resulting process.  After Hours: For all non-emergency after hours needs, please call the office at [phone] and select the option to reach the on-call provider service. On-call services are shared between multiple [place] offices and  therefore it will not be possible to speak directly with your provider. On-call providers may provide medical advice and recommendations, but are unable to provide refills for maintenance medications.  For all emergency or urgent medical needs after normal business hours, we recommend that you seek care at the closest Urgent Care or Emergency Department to ensure appropriate treatment in a timely manner.  [facility] [place] has a 24 hour emergency room located on the ground floor for your convenience.   Urgent Concerns During the Business Day Providers are seeing patients from 8AM to [place] with a busy schedule and are most often not able to respond to non-urgent calls until the end of the day or the next business day. If you should have URGENT concerns during the day, please call and speak to the nurse or schedule a same day appointment so that we can address your concern without delay.   Thank you, again, for choosing me as your health care  partner. I appreciate your trust and look forward to learning more about you.   Jaclyn Keeler, DNP, AGNP-c ___________________________________________________________  Health Maintenance Recommendations Screening Testing Mammogram Every 1 -2 years based on history and risk factors Starting at age 83 Pap Smear Ages 21-39 every 3 years Ages 32-65 every 5 years with HPV testing More frequent testing may be required based on results and history Colon Cancer Screening Every 1-10 years based on test performed, risk factors, and history Starting at age 60 Bone Density Screening Every 2-10 years based on history Starting at age 7 for women Recommendations for men differ based on medication usage, history, and risk factors AAA Screening One time ultrasound Men 56-73 years old who have every smoked Lung Cancer Screening Low Dose Lung CT every 12 months Age 33-80 years with a 30 pack-year smoking history who still smoke or who have quit  within the last 15 years  Screening Labs Routine  Labs: Complete Blood Count (CBC), Complete Metabolic Panel (CMP), Cholesterol (Lipid Panel) Every 6-12 months based on history and medications May be recommended more frequently based on current conditions or previous results Hemoglobin A1c Lab Every 3-12 months based on history and previous results Starting at age 21 or earlier with diagnosis of diabetes, high cholesterol, BMI >26, and/or risk factors Frequent monitoring for patients with diabetes to ensure blood sugar control Thyroid Panel (TSH w/ T3 & T4) Every 6 months based on history, symptoms, and risk factors May be repeated more often if on medication HIV One time testing for all patients 63 and older May be repeated more frequently for patients with increased risk factors or exposure Hepatitis C One time testing for all patients 11 and older May be repeated more frequently for patients with increased risk factors or exposure Gonorrhea, Chlamydia Every 12 months for all sexually active persons 13-24 years Additional monitoring may be recommended for those who are considered high risk or who have symptoms PSA Men 66-25 years old with risk factors Additional screening may be recommended from age 77-69 based on risk factors, symptoms, and history  Vaccine Recommendations Tetanus Booster All adults every 10 years Flu Vaccine All patients 6 months and older every year COVID Vaccine All patients 12 years and older Initial dosing with booster May recommend additional booster based on age and health history HPV Vaccine 2 doses all patients age 70-26 Dosing may be considered for patients over 26 Shingles Vaccine (Shingrix) 2 doses all adults 65 years and older Pneumonia (Pneumovax 23) All adults 58 years and older May recommend earlier dosing based on health history Pneumonia (Prevnar 3) All adults 80 years and older Dosed 1 year after Pneumovax 23  Additional Screening,  Testing, and Vaccinations may be recommended on an individualized basis based on family history, health history, risk factors, and/or exposure.  __________________________________________________________  Diet Recommendations for All Patients  I recommend that all patients maintain a diet low in saturated fats, carbohydrates, and cholesterol. While this can be challenging at first, it is not impossible and small changes can make big differences.  Things to try: Decreasing the amount of soda, sweet tea, and/or juice to one or less per day and replace with water While water is always the first choice, if you do not like water you may consider adding a water additive without sugar to improve the taste other sugar free drinks Replace potatoes with a brightly colored vegetable at dinner Use healthy oils, such as canola oil or olive oil, instead of butter or  hard margarine Limit your bread intake to two pieces or less a day Replace regular pasta with low carb pasta options Bake, broil, or grill foods instead of frying Monitor portion sizes  Eat smaller, more frequent meals throughout the day instead of large meals  An important thing to remember is, if you love foods that are not great for your health, you don't have to give them up completely. Instead, allow these foods to be a reward when you have done well. Allowing yourself to still have special treats every once in a while is a nice way to tell yourself thank you for working hard to keep yourself healthy.   Also remember that every day is a new day. If you have a bad day and "fall off the wagon", you can still climb right back up and keep moving along on your journey!  We have resources available to help you!  Some websites that may be helpful include: www.http://carter.biz/  Www.VeryWellFit.com _____________________________________________________________  Activity Recommendations for All Patients  I recommend that all adults get at least 20  minutes of moderate physical activity that elevates your heart rate at least 5 days out of the week.  Some examples include: Walking or jogging at a pace that allows you to carry on a conversation Cycling (stationary bike or outdoors) Water aerobics Yoga Weight lifting Dancing If physical limitations prevent you from putting stress on your joints, exercise in a pool or seated in a chair are excellent options.  Do determine your MAXIMUM heart rate for activity: YOUR AGE - 220 = MAX HeartRate   Remember! Do not push yourself too hard.  Start slowly and build up your pace, speed, weight, time in exercise, etc.  Allow your body to rest between exercise and get good sleep. You will need more water than normal when you are exerting yourself. Do not wait until you are thirsty to drink. Drink with a purpose of getting in at least 8, 8 ounce glasses of water a day plus more depending on how much you exercise and sweat.    If you begin to develop dizziness, chest pain, abdominal pain, jaw pain, shortness of breath, headache, vision changes, lightheadedness, or other concerning symptoms, stop the activity and allow your body to rest. If your symptoms are severe, seek emergency evaluation immediately. If your symptoms are concerning, but not severe, please let us know so that we can recommend further evaluation.

## 2021-05-17 NOTE — Assessment & Plan Note (Signed)
Chronic. Well controlled.  ?Using OTC supplementation, exercise, and topical treatments when flairs are present.  ?No concerns today.  ?Tried gabapentin, but unable to tolerate side effects.  ?Will monitor.  ?

## 2021-05-17 NOTE — Assessment & Plan Note (Addendum)
New onset. Suspect hormonal imbalance is contributing. Possibly peri-menopause based on age and associated symptoms.  ?Unable to tolerate oral contraceptive due to anxiety exacerbation.  ?Will trial spironolactone PO and ketoconazole shampoo 2-3 times a week on face and upper back.  ?May take 4 weeks to start to notice a difference. May worsen slightly before starting to improve.  ?If no improvement of symptoms, please contact the office for re-evaluation.  ?May consider short term of doxy to get under better control if needed.  ?

## 2021-05-17 NOTE — Assessment & Plan Note (Addendum)
Suspect perimenopausal hormonal changes may be present given associated symptoms.  ?Unable to tolerate OCP due to anxiety exacerbation.  ?Trial of estradiol vaginal cream applied to vaginal opening at bedtime to see if this is helpful. May use 1 full applicator on days one and four with pea sized amount in between and after to see if this is helpful.  ?If no improvement, please contact the office for re-evaluation.  ?Labs today ?

## 2021-05-17 NOTE — Assessment & Plan Note (Signed)
Unknown etiology. Present for the last 6 months.  ?Will obtain labs today for further evaluation.  ?Chronic B12 deficiency may be contributing.  ?Active lifestyle with healthy diet.  ?Will make changes to plan of care based on labs.  ?

## 2021-05-17 NOTE — Progress Notes (Signed)
Orma Render, DNP, AGNP-c Primary Care & Sports Medicine [address]   [facility], [place] [address] [phone]  New patient visit   Patient: Jaclyn Tucker   DOB: 22-Apr-1975   46 y.o. Female  MRN: [id number] Visit Date: 05/17/2021  Patient Care Team: Boleslaus Holloway, Coralee Pesa, NP as PCP - General (Nurse Practitioner)  Today's healthcare provider: Orma Render, NP   Chief Complaint  Patient presents with   New Patient (Initial Visit)    Patient is here to establish. She is concerned about her left ear feels clogged all the time. She would like a B 12, her husband gives them to her. Patient would like full panel of labs. Concerned about hormones, face breaking out, vaginal dryness no desire for intercourse weight gain.   Subjective    MISHAAL Tucker is a 46 y.o. female who presents today as a new patient to establish care.    Patient endorses the following concerns presently: Weight - 20lb weight gain in the last 6 months - Working out routinely with 45 minutes a day cardio and additional time with weight training at least 5 days a week - Monitoring diet, avoids snacking and sweets - Weight distribution not changing  Hormones - Face breaking out at jawline and upper back       - Painful large papules that leave scarring/hyperpigmentation when they heal       - Present for about the last 3-6 months - decreased libido       - very low sexual desire       - new - vaginal dryness - was having two periods a month last year, now back to once a month but flow and length have changed       - started shortly after last COVID vaccine in January  Anxiety - Chronic. Controlled with medication and routine f/u with psychiatry.  - Taking xanax at most about 5 times per month - Frustration that any new symptoms seem to fall back on this  GERD - controlled - worse when anxiety is exacerbated - take omeprazole PRN when it is worse  Ear Fullness -Left  ear - ongoing for about 6 months - no pain, but feels full and pressure - hx of allergies  Also endorses Hair falling out Palpitations - watch reported Afib, found to be rapid HR B12 deficiency- stomach does not absorb on Monthly injections  Birth control accelerated panic attacks when she took in her 57''s and caused severe mood fluctuations.   History reviewed and reveals the following: Past Medical History:  Diagnosis Date   ANEMIA, VITAMIN B12 DEFICIENCY NEC 09/06/2006   Qualifier: Diagnosis of  By: Heath Lark     Anxiety    Depression    Fatty liver 02/26/2019   Fibromyalgia 06/2014   GERD (gastroesophageal reflux disease)    IBS (irritable bowel syndrome) 11/04/2014   Migraine    Obese    Vitamin B12 deficiency    Past Surgical History:  Procedure Laterality Date   g2 p2     Family Status  Relation Name Status   Mother  Alive   Father  Alive   PGM  (Not Specified)   Brother  (Not Specified)   MGM  Alive   Sister  (Not Specified)   PGF  (Not Specified)   Other PGGM Alive   Neg Hx  (Not Specified)   Family History  Problem Relation Age of Onset  Diabetes Mother    Hypertension Mother    Cerebrovascular Accident Mother    Hypertension Father    Crohn's disease Father    Colon polyps Father    Colon cancer Paternal Grandmother    Breast cancer Paternal Grandmother    Crohn's disease Brother    Anxiety disorder Sister    Lung disease Paternal Grandfather    Alzheimer's disease Other    Esophageal cancer Neg Hx    Rectal cancer Neg Hx    Stomach cancer Neg Hx    Prostate cancer Neg Hx    Pancreatic cancer Neg Hx    Ovarian cancer Neg Hx    Social History   Socioeconomic History   Marital status: Married    Spouse name: Jaclyn Tucker   Number of children: 2   Years of education: Not on file   Highest education level: 12th grade  Occupational History   Occupation: customer service  Tobacco Use   Smoking status: Former    Types: Cigarettes    Quit  date: 04/13/2014    Years since quitting: 7.0   Smokeless tobacco: Never  Vaping Use   Vaping Use: Former  Substance and Sexual Activity   Alcohol use: Yes    Alcohol/week: 0.0 standard drinks    Comment: 1-2 per month   Drug use: No   Sexual activity: Yes    Birth control/protection: None  Other Topics Concern   Not on file  Social History Narrative   Married   2 sons 1999 and 2008   Customer relations Physiological scientist since 2006   No caffeine   11/04/2014   Pt married, lives with husband and 2 children in a one level home. Drinks one cup of coffee a day, maybe 3 sodas a week. Works out at [employer] a week.   Social Determinants of Health   Financial Resource Strain: Not on file  Food Insecurity: Not on file  Transportation Needs: Not on file  Physical Activity: Not on file  Stress: Not on file  Social Connections: Not on file   Outpatient Medications Prior to Visit  Medication Sig   ALPRAZolam (XANAX) 0.5 MG tablet Take 0.5 mg by mouth daily as needed.    EPINEPHrine 0.3 mg/0.3 mL IJ SOAJ injection As needed.   sertraline (ZOLOFT) 50 MG tablet Take 75 mg by mouth daily.    valACYclovir (VALTREX) 1000 MG tablet As needed.   [DISCONTINUED] cyanocobalamin (,VITAMIN B-12,) 1000 MCG/ML injection Inject 1 mL (1,000 mcg total) into the muscle every 30 (thirty) days.   [DISCONTINUED] omeprazole (PRILOSEC) 20 MG capsule Take 1 capsule (20 mg total) by mouth daily.   [DISCONTINUED] pantoprazole (PROTONIX) 40 MG tablet TAKE 1 TABLET(40 MG) BY MOUTH DAILY   [DISCONTINUED] Syringe/Needle, Disp, (SYRINGE 3CC/20GX1-1/2") 20G X 1-1/2" 3 ML MISC Use to inject b12   No facility-administered medications prior to visit.   Allergies  Allergen Reactions   Bee Venom Swelling   Cyclobenzaprine Swelling   Moxifloxacin Anaphylaxis, Palpitations and Other (See Comments)    REACTION: nausea REACTION: nausea REACTION: nausea   Moxifloxacin Hcl In Nacl Anaphylaxis   Penicillins Other (See  Comments)    REACTION: swelling REACTION: swelling Parents told her she couldn't take it   Quinolones Anaphylaxis and Palpitations    REACTION: nausea   Shellfish Allergy Rash   Sulfate Rash   Sumatriptan Succinate     Other reaction(s): Dizziness, Other REACTION: nausea, dizziness, panic symptoms REACTION: nausea, dizziness, panic symptoms  Celexa [Citalopram Hydrobromide] Other (See Comments)    Panic attacks   Duloxetine Other (See Comments)    Panic attacks   Escitalopram Other (See Comments)    Panic attacks   Ranitidine Hcl     Other reaction(s): Other (See Comments) uknown   Sumatriptan Succinate     REACTION: nausea, dizziness, panic symptoms   Immunization History  Administered Date(s) Administered   Influenza,inj,Quad PF,6+ Mos 01/11/2013, 01/08/2015   Influenza-Unspecified 01/21/2016, 01/11/2018, 01/24/2018   PFIZER(Purple Top)SARS-COV-2 Vaccination 03/03/2020   Tdap 03/14/2011   Unspecified SARS-COV-2 Vaccination 05/26/2019, 06/30/2019    Health Maintenance Due: Health Maintenance  Topic Date Due   Hepatitis C Screening  Never done   COVID-19 Vaccine (4 - Booster) 04/28/2020   PAP SMEAR-Modifier  07/30/2020   TETANUS/TDAP  03/13/2021   INFLUENZA VACCINE  06/10/2021 (Originally 10/11/2020)   COLONOSCOPY (Pts 45-80yr Insurance coverage will need to be confirmed)  02/22/2025   HIV Screening  Completed   HPV VACCINES  Aged Out    Review of Systems All review of systems negative except what is listed in the HPI   Objective    BP 117/72    Pulse 96    Ht '5\' 5"'$  (1.651 m)    Wt 194 lb 3.2 oz (88.1 kg)    SpO2 97%    BMI 32.32 kg/m  Physical Exam Vitals and nursing note reviewed.  Constitutional:      General: She is not in acute distress.    Appearance: Normal appearance.  Eyes:     Extraocular Movements: Extraocular movements intact.     Conjunctiva/sclera: Conjunctivae normal.     Pupils: Pupils are equal, round, and reactive to light.  Neck:      Vascular: No carotid bruit.  Cardiovascular:     Rate and Rhythm: Regular rhythm. Tachycardia present.     Pulses: Normal pulses.     Heart sounds: Normal heart sounds. No murmur heard. Pulmonary:     Effort: Pulmonary effort is normal.     Breath sounds: Normal breath sounds. No wheezing.  Abdominal:     General: Bowel sounds are normal.     Palpations: Abdomen is soft.     Tenderness: There is no abdominal tenderness.  Musculoskeletal:        General: Normal range of motion.     Cervical back: Normal range of motion. No tenderness.     Right lower leg: No edema.     Left lower leg: No edema.  Lymphadenopathy:     Cervical: No cervical adenopathy.  Skin:    General: Skin is warm and dry.     Capillary Refill: Capillary refill takes less than 2 seconds.  Neurological:     General: No focal deficit present.     Mental Status: She is alert and oriented to person, place, and time.  Psychiatric:        Mood and Affect: Mood normal.        Behavior: Behavior normal.        Thought Content: Thought content normal.        Judgment: Judgment normal.    No results found for any visits on 05/17/21.  Assessment & Plan      Problem List Items Addressed This Visit     Fatigue    Unknown etiology. Present for the last 6 months.  Will obtain labs today for further evaluation.  Chronic B12 deficiency may be contributing.  Active lifestyle with healthy diet.  Will  make changes to plan of care based on labs.       Relevant Orders   Thyroid Peroxidase Antibodies (TPO) (REFL)   Thyroid stimulating immunoglobulin   TSH   T4   T3   VITAMIN D 25 Hydroxy (Vit-D Deficiency, Fractures)   FSH/LH   Fibromyalgia    Chronic. Well controlled.  Using OTC supplementation, exercise, and topical treatments when flairs are present.  No concerns today.  Tried gabapentin, but unable to tolerate side effects.  Will monitor.       Relevant Orders   Thyroid Peroxidase Antibodies (TPO) (REFL)    Thyroid stimulating immunoglobulin   TSH   T4   T3   Gastroesophageal reflux disease    Chronic. Controlled with PRN omeprazole.  No alarm sx present today.  Will send refill to pharmacy.  Monitor for new or worsening sx and schedule f/u if these present.       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 Requiring monthly B12 for many years.  Husband gives injections at home.  In need of refill today.  Will monitor labs today.  Plan to f/u for labs in 6 months or sooner if needed.       Relevant Medications   cyanocobalamin (,VITAMIN B-12,) 1000 MCG/ML injection   Syringe/Needle, Disp, (SYRINGE 3CC/20GX1-1/2") 20G X 1-1/2" 3 ML MISC   Other Relevant Orders   B12 and Folate Panel   Iron, TIBC and Ferritin Panel   CBC with Differential/Platelet   Comprehensive metabolic panel   TSH   T4   T3   VITAMIN D 25 Hydroxy (Vit-D Deficiency, Fractures)   Cystic acne vulgaris - Primary    New onset. Suspect hormonal imbalance is contributing. Possibly peri-menopause based on age and associated symptoms.  Unable to tolerate oral contraceptive due to anxiety exacerbation.  Will trial spironolactone PO and ketoconazole shampoo 2-3 times a week on face and upper back.  May take 4 weeks to start to notice a difference. May worsen slightly before starting to improve.  If no improvement of symptoms, please contact the office for re-evaluation.  May consider short term of doxy to get under better control if needed.       Relevant Medications   spironolactone (ALDACTONE) 25 MG tablet   ketoconazole (NIZORAL) 2 % shampoo (Start on 05/19/2021)   Other Relevant Orders   TSH   T4   T3   Weight gain    20lb in 6 months.  Adequate activity and diet modifications.  Etiology unclear at this time.  With associated symptoms concerned for possible thyroid dysfunction. Will monitor thyroid and antibodies today.  Will  make changes to plan of care based on labs.       Relevant Orders   Thyroid Peroxidase Antibodies (TPO) (REFL)   Thyroid stimulating immunoglobulin   B12 and Folate Panel   Iron, TIBC and Ferritin Panel   CBC with Differential/Platelet   Comprehensive metabolic panel   TSH   T4   T3   FSH/LH   Palpitations    Intermittent. Recent w/u negative for cardiac concerns.  Consider possible thyroid or deficiency could be contributing.  Will monitor labs today. Mildly tachy today with regular rhythm. No alarm sx present.  Will make changes to plan of care based on labs.  Call the office for evaluation if sx worsen or fail to improve.  Relevant Orders   Thyroid Peroxidase Antibodies (TPO) (REFL)   Thyroid stimulating immunoglobulin   B12 and Folate Panel   Iron, TIBC and Ferritin Panel   CBC with Differential/Platelet   Comprehensive metabolic panel   TSH   T4   T3   FSH/LH   Decreased libido    Suspect peri-menopause or possible hormonal changes could be contributing.  Anxiety state limits use of wellbutrin as adjunct. Low concern for sertraline being cause as she has been on this for quite some time.  Will obtain labs today and make changes to the plan of care as necessary based on results.        Relevant Orders   Thyroid Peroxidase Antibodies (TPO) (REFL)   Thyroid stimulating immunoglobulin   TSH   T4   T3   FSH/LH   Vaginal dryness    Suspect perimenopausal hormonal changes may be present given associated symptoms.  Unable to tolerate OCP due to anxiety exacerbation.  Trial of estradiol vaginal cream applied to vaginal opening at bedtime to see if this is helpful. May use 1 full applicator on days one and four with pea sized amount in between and after to see if this is helpful.  If no improvement, please contact the office for re-evaluation.  Labs today      Relevant Medications   estradiol (ESTRACE) 0.1 MG/GM vaginal cream   Other Relevant Orders   Thyroid  Peroxidase Antibodies (TPO) (REFL)   Thyroid stimulating immunoglobulin   TSH   T4   T3   FSH/LH   Other Visit Diagnoses     Healthcare maintenance       Relevant Orders   Thyroid Peroxidase Antibodies (TPO) (REFL)   Thyroid stimulating immunoglobulin   B12 and Folate Panel   Iron, TIBC and Ferritin Panel   CBC with Differential/Platelet   Comprehensive metabolic panel   Lipid panel   Hemoglobin A1c   TSH   T4   T3   VITAMIN D 25 Hydroxy (Vit-D Deficiency, Fractures)   FSH/LH        Return for TBD based on labs.     Bowie Delia, Coralee Pesa, NP, DNP, AGNP-C [facility] at [place]

## 2021-05-17 NOTE — Assessment & Plan Note (Signed)
20lb in 6 months.  ?Adequate activity and diet modifications.  ?Etiology unclear at this time.  ?With associated symptoms concerned for possible thyroid dysfunction. Will monitor thyroid and antibodies today.  ?Will make changes to plan of care based on labs.  ?

## 2021-05-17 NOTE — Assessment & Plan Note (Signed)
Chronic. Requiring monthly B12 for many years.  ?Husband gives injections at home.  ?In need of refill today.  ?Will monitor labs today.  ?Plan to f/u for labs in 6 months or sooner if needed.  ?

## 2021-05-17 NOTE — Assessment & Plan Note (Signed)
Suspect peri-menopause or possible hormonal changes could be contributing.  ?Anxiety state limits use of wellbutrin as adjunct. Low concern for sertraline being cause as she has been on this for quite some time.  ?Will obtain labs today and make changes to the plan of care as necessary based on results.  ? ?

## 2021-05-17 NOTE — Assessment & Plan Note (Signed)
Chronic. Controlled with PRN omeprazole.  ?No alarm sx present today.  ?Will send refill to pharmacy.  ?Monitor for new or worsening sx and schedule f/u if these present.  ?

## 2021-05-17 NOTE — Telephone Encounter (Signed)
Out of syringes for B12. Please call pharmacists back at [phone] ?Please advise. ?

## 2021-05-17 NOTE — Assessment & Plan Note (Signed)
>>  ASSESSMENT AND PLAN FOR WEIGHT GAIN WRITTEN ON 05/17/2021 10:22 AM BY Keishawn Rajewski E, NP  20lb in 6 months.  Adequate activity and diet modifications.  Etiology unclear at this time.  With associated symptoms concerned for possible thyroid  dysfunction. Will monitor thyroid  and antibodies today.  Will make changes to plan of care based on labs.

## 2021-05-18 LAB — CBC WITH DIFFERENTIAL/PLATELET
Basophils Absolute: 0 10*3/uL (ref 0.0–0.2)
Basos: 1 %
EOS (ABSOLUTE): 0.1 10*3/uL (ref 0.0–0.4)
Eos: 2 %
Hematocrit: 43.5 % (ref 34.0–46.6)
Hemoglobin: 14.2 g/dL (ref 11.1–15.9)
Immature Grans (Abs): 0 10*3/uL (ref 0.0–0.1)
Immature Granulocytes: 0 %
Lymphocytes Absolute: 2 10*3/uL (ref 0.7–3.1)
Lymphs: 32 %
MCH: 28.8 pg (ref 26.6–33.0)
MCHC: 32.6 g/dL (ref 31.5–35.7)
MCV: 88 fL (ref 79–97)
Monocytes Absolute: 0.4 10*3/uL (ref 0.1–0.9)
Monocytes: 7 %
Neutrophils Absolute: 3.8 10*3/uL (ref 1.4–7.0)
Neutrophils: 58 %
Platelets: 354 10*3/uL (ref 150–450)
RBC: 4.93 x10E6/uL (ref 3.77–5.28)
RDW: 12.8 % (ref 11.7–15.4)
WBC: 6.4 10*3/uL (ref 3.4–10.8)

## 2021-05-18 LAB — LIPID PANEL
Chol/HDL Ratio: 4.5 ratio — ABNORMAL HIGH (ref 0.0–4.4)
Cholesterol, Total: 184 mg/dL (ref 100–199)
HDL: 41 mg/dL (ref >39)
LDL Chol Calc (NIH): 125 mg/dL — ABNORMAL HIGH (ref 0–99)
Triglycerides: 97 mg/dL (ref 0–149)
VLDL Cholesterol Cal: 18 mg/dL (ref 5–40)

## 2021-05-18 LAB — COMPREHENSIVE METABOLIC PANEL
ALT: 17 IU/L (ref 0–32)
AST: 15 IU/L (ref 0–40)
Albumin/Globulin Ratio: 1.4 (ref 1.2–2.2)
Albumin: 4.3 g/dL (ref 3.8–4.8)
Alkaline Phosphatase: 74 IU/L (ref 44–121)
BUN/Creatinine Ratio: 12 (ref 9–23)
BUN: 11 mg/dL (ref 6–24)
Bilirubin Total: 0.2 mg/dL (ref 0.0–1.2)
CO2: 20 mmol/L (ref 20–29)
Calcium: 9.2 mg/dL (ref 8.7–10.2)
Chloride: 104 mmol/L (ref 96–106)
Creatinine, Ser: 0.91 mg/dL (ref 0.57–1.00)
Globulin, Total: 3 g/dL (ref 1.5–4.5)
Glucose: 96 mg/dL (ref 70–99)
Potassium: 4.7 mmol/L (ref 3.5–5.2)
Sodium: 137 mmol/L (ref 134–144)
Total Protein: 7.3 g/dL (ref 6.0–8.5)
eGFR: 80 mL/min/{1.73_m2} (ref >59)

## 2021-05-18 LAB — THYROID STIMULATING IMMUNOGLOBULIN: Thyroid Stim Immunoglobulin: <0.1 IU/L (ref 0.00–0.55)

## 2021-05-18 LAB — VITAMIN D 25 HYDROXY (VIT D DEFICIENCY, FRACTURES): Vit D, 25-Hydroxy: 18.1 ng/mL — ABNORMAL LOW (ref 30.0–100.0)

## 2021-05-18 LAB — HEMOGLOBIN A1C
Est. average glucose Bld gHb Est-mCnc: 105 mg/dL
Hgb A1c MFr Bld: 5.3 % (ref 4.8–5.6)

## 2021-05-18 LAB — IRON,TIBC AND FERRITIN PANEL
Ferritin: 34 ng/mL (ref 15–150)
Iron Saturation: 13 % — ABNORMAL LOW (ref 15–55)
Iron: 45 ug/dL (ref 27–159)
Total Iron Binding Capacity: 335 ug/dL (ref 250–450)
UIBC: 290 ug/dL (ref 131–425)

## 2021-05-18 LAB — B12 AND FOLATE PANEL
Folate: 9.1 ng/mL (ref >3.0)
Vitamin B-12: 448 pg/mL (ref 232–1245)

## 2021-05-18 LAB — THYROID PEROXIDASE ANTIBODY: Thyroperoxidase Ab SerPl-aCnc: <9 IU/mL (ref 0–34)

## 2021-05-18 LAB — T4: T4, Total: 5.7 ug/dL (ref 4.5–12.0)

## 2021-05-18 LAB — FSH/LH
FSH: 6 m[IU]/mL
LH: 5.9 m[IU]/mL

## 2021-05-18 LAB — TSH: TSH: 1.46 u[IU]/mL (ref 0.450–4.500)

## 2021-05-18 LAB — T3: T3, Total: 125 ng/dL (ref 71–180)

## 2021-05-20 ENCOUNTER — Telehealth (HOSPITAL_BASED_OUTPATIENT_CLINIC_OR_DEPARTMENT_OTHER): Payer: Self-pay

## 2021-05-20 MED ORDER — VITAMIN D (ERGOCALCIFEROL) 1.25 MG (50000 UNIT) PO CAPS: 50000.0000 [IU] | ORAL_CAPSULE | ORAL | 0 refills | Status: DC

## 2021-05-20 MED ORDER — FERROUS SULFATE 325 (65 FE) MG PO TBEC: DELAYED_RELEASE_TABLET | ORAL | 3 refills | Status: DC

## 2021-05-20 NOTE — Addendum Note (Signed)
Addended by: Aarik Blank, Clarise Cruz E on: 05/20/2021 05:27 PM ? ? Modules accepted: Orders ? ?

## 2021-05-23 ENCOUNTER — Encounter (HOSPITAL_BASED_OUTPATIENT_CLINIC_OR_DEPARTMENT_OTHER): Payer: Self-pay

## 2021-05-23 NOTE — Progress Notes (Signed)
Tried calling pt to sch-lab repeat in 12 weeks for low iron stores and vitamin D deficiency recheck.  but vm was full will send pt MyChart message ? ?

## 2021-06-02 DIAGNOSIS — F410 Panic disorder [episodic paroxysmal anxiety] without agoraphobia: Secondary | ICD-10-CM | POA: Diagnosis not present

## 2021-06-02 DIAGNOSIS — F411 Generalized anxiety disorder: Secondary | ICD-10-CM | POA: Diagnosis not present

## 2021-06-09 ENCOUNTER — Ambulatory Visit (HOSPITAL_BASED_OUTPATIENT_CLINIC_OR_DEPARTMENT_OTHER): Payer: BC Managed Care – PPO | Admitting: Nurse Practitioner

## 2021-06-10 ENCOUNTER — Ambulatory Visit (HOSPITAL_BASED_OUTPATIENT_CLINIC_OR_DEPARTMENT_OTHER)
Admission: RE | Admit: 2021-06-10 | Discharge: 2021-06-10 | Disposition: A | Discharge status: Home / Self Care | Payer: BC Managed Care – PPO | Source: Ambulatory Visit | Attending: Nurse Practitioner | Admitting: Nurse Practitioner

## 2021-06-10 ENCOUNTER — Encounter (HOSPITAL_BASED_OUTPATIENT_CLINIC_OR_DEPARTMENT_OTHER): Payer: Self-pay | Admitting: Radiology

## 2021-06-10 DIAGNOSIS — Z1231 Encounter for screening mammogram for malignant neoplasm of breast: Secondary | ICD-10-CM | POA: Insufficient documentation

## 2021-06-15 ENCOUNTER — Encounter (HOSPITAL_BASED_OUTPATIENT_CLINIC_OR_DEPARTMENT_OTHER): Payer: Self-pay | Admitting: Nurse Practitioner

## 2021-06-16 NOTE — Progress Notes (Signed)
Patient has reviewed results via [facility] with time stamp 06/15/2021 7:50pm. AS, CMA ?

## 2021-06-18 DIAGNOSIS — L501 Idiopathic urticaria: Secondary | ICD-10-CM | POA: Insufficient documentation

## 2021-06-18 DIAGNOSIS — R0789 Other chest pain: Secondary | ICD-10-CM | POA: Diagnosis not present

## 2021-06-18 DIAGNOSIS — S29012A Strain of muscle and tendon of back wall of thorax, initial encounter: Secondary | ICD-10-CM | POA: Diagnosis not present

## 2021-06-20 ENCOUNTER — Ambulatory Visit (HOSPITAL_BASED_OUTPATIENT_CLINIC_OR_DEPARTMENT_OTHER): Payer: BC Managed Care – PPO | Admitting: Nurse Practitioner

## 2021-07-13 ENCOUNTER — Encounter (HOSPITAL_BASED_OUTPATIENT_CLINIC_OR_DEPARTMENT_OTHER): Payer: Self-pay | Admitting: Nurse Practitioner

## 2021-08-02 DIAGNOSIS — Z713 Dietary counseling and surveillance: Secondary | ICD-10-CM | POA: Diagnosis not present

## 2021-08-09 ENCOUNTER — Ambulatory Visit (INDEPENDENT_AMBULATORY_CARE_PROVIDER_SITE_OTHER): Payer: BC Managed Care – PPO | Admitting: Nurse Practitioner

## 2021-08-09 ENCOUNTER — Encounter (HOSPITAL_BASED_OUTPATIENT_CLINIC_OR_DEPARTMENT_OTHER): Payer: Self-pay | Admitting: Nurse Practitioner

## 2021-08-09 ENCOUNTER — Ambulatory Visit (HOSPITAL_BASED_OUTPATIENT_CLINIC_OR_DEPARTMENT_OTHER): Payer: BC Managed Care – PPO

## 2021-08-09 VITALS — BP 122/82 | HR 68 | Ht 65.0 in | Wt 193.0 lb

## 2021-08-09 DIAGNOSIS — D259 Leiomyoma of uterus, unspecified: Secondary | ICD-10-CM

## 2021-08-09 DIAGNOSIS — Z9289 Personal history of other medical treatment: Secondary | ICD-10-CM | POA: Insufficient documentation

## 2021-08-09 DIAGNOSIS — K760 Fatty (change of) liver, not elsewhere classified: Secondary | ICD-10-CM | POA: Diagnosis not present

## 2021-08-09 DIAGNOSIS — J984 Other disorders of lung: Secondary | ICD-10-CM | POA: Diagnosis not present

## 2021-08-09 NOTE — Assessment & Plan Note (Addendum)
Review of echocardiogram results with patient today.  Discussion of anatomy and importance of results.  No alarm findings on report.  Patient reassured that cardiac findings were normal with no signs of carotid atherosclerosis present.  Abdominal aorta less than 3 cm with no signs of aneurysm.  Discussed with patient if she begins to develop any symptoms to let me know.  Current results are in excellent baseline.

## 2021-08-09 NOTE — Assessment & Plan Note (Signed)
Fatty liver infiltration detected on body scan.  Report in my chart.  No alarm symptoms present today.

## 2021-08-09 NOTE — Patient Instructions (Addendum)
It was a pleasure seeing you today. I hope your time spent with [place] was pleasant and helpful. Please let us know if there is anything we can do to improve the service you receive.   Today we discussed concerns with:  We will plan to recheck the lungs later this fall I will let you know what the labs show- if there is anything concerning we can discuss If you dont get a response from GYN let me know and I will be happy to send you to someone     Important Office Information Lab Results If labs were ordered, please note that you will see results through [facility] as soon as they come available from [facility].  It takes up to 5 business days for the results to be routed to me and for me to review them once all of the lab results have come through from [facility]. I will make recommendations based on your results and send these through [facility] or someone from the office will call you to discuss. If your labs are abnormal, we may contact you to schedule a visit to discuss the results and make recommendations.  If you have not heard from [place] within 5 business days or you have waited longer than a week and your lab results have not come through on [facility], please feel free to call the office or send a message through [facility] to follow-up on these labs.   Referrals If referrals were placed today, the office where the referral was sent will contact you either by phone or through [facility] to set up scheduling. Please note that it can take up to a week for the referral office to contact you. If you do not hear from them in a week, please contact the referral office directly to inquire about scheduling.   Condition Treated If your condition worsens or you begin to have new symptoms, please schedule a follow-up appointment for further evaluation. If you are not sure if an appointment is needed, you may call the office to leave a message for the nurse and someone will contact you with recommendations.  If you have an  urgent or life threatening emergency, please do not call the office, but seek emergency evaluation by calling 911 or going to the nearest emergency room for evaluation.   MyChart and Phone Calls Please do not use MyChart for urgent messages. It may take up to 3 business days for MyChart messages to be read by staff and if they are unable to handle the request, an additional 3 business days for them to be routed to me and for my response.  Messages sent to the provider through [facility] do not come directly to the provider, please allow time for these messages to be routed and for me to respond.  We get a large volume of MyChart messages daily and these are responded to in the order received.   For urgent messages, please call the office at [phone] and speak with the front office staff or leave a message on the line of my assistant for guidance.  We are seeing patients from the hours of 8:00 am through 5:00 pm and calls directly to the nurse may not be answered immediately due to seeing patients, but your call will be returned as soon as possible.  Phone  messages received after 4:00 PM Monday through Thursday may not be returned until the following business day. Phone messages received after 11:00 AM on Friday may not be returned until  Monday.   After Hours We share on call hours with providers from other offices. If you have an urgent need after hours that cannot wait until the next business day, please contact the on call provider by calling the office number. A nurse will speak with you and contact the provider if needed for recommendations.  If you have an urgent or life threatening emergency after hours, please do not call the on call provider, but seek emergency evaluation by calling 911 or going to the nearest emergency room for evaluation.   Paperwork All paperwork requires a minimum of 5 days to complete and return to you or the designated personnel. Please keep this in mind when bringing  in forms or sending requests for paperwork completion to the office.

## 2021-08-09 NOTE — Assessment & Plan Note (Signed)
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  Reviewed details with patient today.  Initial finding in 2019 with follow-up in 2022 showing no changes.  Reassured patient that no changes are a good sign and the appearance is stable which is consistent with a benign finding.  Discussed the option of repeat x-ray this fall to ensure that scarring remains stable and she would appreciate that for peace of mind.  We will plan to follow-up in the fall with a chest x-ray for repeat evaluation.  No alarm symptoms present at this time.

## 2021-08-09 NOTE — Assessment & Plan Note (Signed)
Recent body scan shows the presence of suspected fibroid in the uterus measuring approximately 4.3 cm.  Discussed recommendation with patient to speak to her gynecologist for further recommendations and evaluation if further studies or removal are needed at this time.  Based on patient's heavy menstrual cycles this does seem to be a likely culprit.  She will reach out if she is in need of a referral.

## 2021-08-09 NOTE — Progress Notes (Signed)
Worthy Keeler, DNP, AGNP-c [address] [place], [place] [address] [phone] Office [phone] Fax  ESTABLISHED PATIENT- Chronic Health and/or Follow-Up Visit  Blood pressure 122/82, pulse 68, height '5\' 5"'$  (1.651 m), weight 193 lb (87.5 kg), SpO2 98 %.  Follow-up (Patient had scan and would like to discuss. Would like vitamin D)   HPI  Jaclyn Tucker  is a 46 y.o. year old female presenting today for evaluation and management of the following: Recent echocardiogram and body scan Patient's husband is a Airline pilot and she was offered a echocardiogram screening and body scan to be performed as a family member.  She has a results with her here today. Echocardiogram has been sent through [facility] and is available for review Normal cardiac functioning with no signs of abnormality present based on report provided today Abnormality noted in the uterus with presence of possible fibroid Patient endorses heavy menstrual bleeding with some basic soaking through super tampon and 1 hour. No weight loss, abdominal pain, constipation, night sweats. Lung scarring Patient endorses history of lung scarring in the left lower lobe found on previous chest x-ray She would like to know if further evaluation or work-up is necessary for this  ROS All ROS negative with exception of what is listed in HPI  PHYSICAL EXAM Physical Exam Vitals and nursing note reviewed.  Constitutional:      Appearance: Normal appearance.  HENT:     Head: Normocephalic.  Eyes:     Extraocular Movements: Extraocular movements intact.     Pupils: Pupils are equal, round, and reactive to light.  Neck:     Vascular: No carotid bruit.  Cardiovascular:     Rate and Rhythm: Normal rate and regular rhythm.     Pulses: Normal pulses.     Heart sounds: Normal heart sounds.  Pulmonary:     Effort: Pulmonary effort is normal.     Breath sounds: Normal breath  sounds.  Abdominal:     General: Bowel sounds are normal.     Palpations: Abdomen is soft.  Musculoskeletal:     Cervical back: Normal range of motion.  Skin:    General: Skin is warm and dry.     Capillary Refill: Capillary refill takes less than 2 seconds.  Neurological:     General: No focal deficit present.     Mental Status: She is alert and oriented to person, place, and time.  Psychiatric:        Mood and Affect: Mood normal.        Behavior: Behavior normal.        Thought Content: Thought content normal.        Judgment: Judgment normal.    ASSESSMENT & PLAN Problem List Items Addressed This Visit     Fatty liver    Fatty liver infiltration detected on body scan.  Report in my chart.  No alarm symptoms present today.       H/O echocardiogram    Review of echocardiogram results with patient today.  Discussion of anatomy and importance of results.  No alarm findings on report.  Patient reassured that cardiac findings were normal with no signs of carotid atherosclerosis present.  Abdominal aorta less than 3 cm with no signs of aneurysm.  Discussed with patient if she begins to develop any symptoms to let me know.  Current results are in excellent baseline.       Uterine leiomyoma - Primary  Recent body scan shows the presence of suspected fibroid in the uterus measuring approximately 4.3 cm.  Discussed recommendation with patient to speak to her gynecologist for further recommendations and evaluation if further studies or removal are needed at this time.  Based on patient's heavy menstrual cycles this does seem to be a likely culprit.  She will reach out if she is in need of a referral.       Scarring of lung    Incidental finding of scarring in the left lung on previous chest x-ray.  Reviewed details with patient today.  Initial finding in 2019 with follow-up in 2022 showing no changes.  Reassured patient that no changes are a good sign and the appearance is stable  which is consistent with a benign finding.  Discussed the option of repeat x-ray this fall to ensure that scarring remains stable and she would appreciate that for peace of mind.  We will plan to follow-up in the fall with a chest x-ray for repeat evaluation.  No alarm symptoms present at this time.         FOLLOW-UP  Fall for CXR    Worthy Keeler, DNP, AGNP-c 08/09/2021  4:29 PM

## 2021-08-10 ENCOUNTER — Other Ambulatory Visit (HOSPITAL_BASED_OUTPATIENT_CLINIC_OR_DEPARTMENT_OTHER): Payer: Self-pay | Admitting: Nurse Practitioner

## 2021-08-10 DIAGNOSIS — R790 Abnormal level of blood mineral: Secondary | ICD-10-CM | POA: Diagnosis not present

## 2021-08-10 DIAGNOSIS — R002 Palpitations: Secondary | ICD-10-CM | POA: Diagnosis not present

## 2021-08-10 DIAGNOSIS — E559 Vitamin D deficiency, unspecified: Secondary | ICD-10-CM | POA: Diagnosis not present

## 2021-08-11 LAB — IRON,TIBC AND FERRITIN PANEL
Ferritin: 45 ng/mL (ref 15–150)
Iron Saturation: 22 % (ref 15–55)
Iron: 82 ug/dL (ref 27–159)
Total Iron Binding Capacity: 371 ug/dL (ref 250–450)
UIBC: 289 ug/dL (ref 131–425)

## 2021-08-11 LAB — VITAMIN D 25 HYDROXY (VIT D DEFICIENCY, FRACTURES): Vit D, 25-Hydroxy: 42.4 ng/mL (ref 30.0–100.0)

## 2021-09-01 ENCOUNTER — Encounter (HOSPITAL_BASED_OUTPATIENT_CLINIC_OR_DEPARTMENT_OTHER): Payer: Self-pay | Admitting: Nurse Practitioner

## 2021-09-01 DIAGNOSIS — D259 Leiomyoma of uterus, unspecified: Secondary | ICD-10-CM

## 2021-09-01 DIAGNOSIS — F411 Generalized anxiety disorder: Secondary | ICD-10-CM | POA: Diagnosis not present

## 2021-09-01 DIAGNOSIS — F410 Panic disorder [episodic paroxysmal anxiety] without agoraphobia: Secondary | ICD-10-CM | POA: Diagnosis not present

## 2021-09-01 DIAGNOSIS — R1900 Intra-abdominal and pelvic swelling, mass and lump, unspecified site: Secondary | ICD-10-CM

## 2021-09-05 NOTE — Telephone Encounter (Signed)
Orders placed for CT abdomen/pelvis for further evaluation of mass found on [place]. Repeat US unclear.

## 2021-10-03 ENCOUNTER — Ambulatory Visit
Admission: RE | Admit: 2021-10-03 | Discharge: 2021-10-03 | Disposition: A | Discharge status: Home / Self Care | Payer: BC Managed Care – PPO | Source: Ambulatory Visit | Attending: Nurse Practitioner | Admitting: Nurse Practitioner

## 2021-10-03 DIAGNOSIS — Z8719 Personal history of other diseases of the digestive system: Secondary | ICD-10-CM | POA: Diagnosis not present

## 2021-10-03 DIAGNOSIS — R1900 Intra-abdominal and pelvic swelling, mass and lump, unspecified site: Secondary | ICD-10-CM | POA: Diagnosis not present

## 2021-10-03 DIAGNOSIS — D251 Intramural leiomyoma of uterus: Secondary | ICD-10-CM | POA: Diagnosis not present

## 2021-10-03 DIAGNOSIS — R160 Hepatomegaly, not elsewhere classified: Secondary | ICD-10-CM | POA: Diagnosis not present

## 2021-10-03 MED ORDER — IOPAMIDOL (ISOVUE-300) INJECTION 61%
100.0000 mL | Freq: Once | INTRAVENOUS | Status: AC | PRN
  Administered 2021-10-03: 100 mL via INTRAVENOUS

## 2021-12-01 ENCOUNTER — Other Ambulatory Visit (HOSPITAL_BASED_OUTPATIENT_CLINIC_OR_DEPARTMENT_OTHER): Payer: Self-pay | Admitting: Nurse Practitioner

## 2021-12-01 DIAGNOSIS — J984 Other disorders of lung: Secondary | ICD-10-CM

## 2021-12-02 ENCOUNTER — Ambulatory Visit
Admission: RE | Admit: 2021-12-02 | Discharge: 2021-12-02 | Disposition: A | Discharge status: Home / Self Care | Payer: BC Managed Care – PPO | Source: Ambulatory Visit | Attending: Nurse Practitioner | Admitting: Nurse Practitioner

## 2021-12-08 DIAGNOSIS — F411 Generalized anxiety disorder: Secondary | ICD-10-CM | POA: Diagnosis not present

## 2021-12-08 DIAGNOSIS — F410 Panic disorder [episodic paroxysmal anxiety] without agoraphobia: Secondary | ICD-10-CM | POA: Diagnosis not present

## 2022-01-10 ENCOUNTER — Encounter (HOSPITAL_BASED_OUTPATIENT_CLINIC_OR_DEPARTMENT_OTHER): Payer: Self-pay | Admitting: Nurse Practitioner

## 2022-01-10 ENCOUNTER — Ambulatory Visit (INDEPENDENT_AMBULATORY_CARE_PROVIDER_SITE_OTHER): Payer: BC Managed Care – PPO | Admitting: Nurse Practitioner

## 2022-01-10 VITALS — BP 134/82 | HR 84 | Ht 64.0 in | Wt 194.7 lb

## 2022-01-10 DIAGNOSIS — J0140 Acute pansinusitis, unspecified: Secondary | ICD-10-CM

## 2022-01-10 DIAGNOSIS — G44209 Tension-type headache, unspecified, not intractable: Secondary | ICD-10-CM | POA: Diagnosis not present

## 2022-01-10 DIAGNOSIS — R519 Headache, unspecified: Secondary | ICD-10-CM

## 2022-01-10 MED ORDER — SUMATRIPTAN SUCCINATE 6 MG/0.5ML ~~LOC~~ SOAJ: 6.0000 mg | Freq: Once | SUBCUTANEOUS | 0 refills | Status: DC

## 2022-01-10 MED ORDER — AZITHROMYCIN 250 MG PO TABS: ORAL_TABLET | ORAL | 0 refills | Status: AC

## 2022-01-10 MED ORDER — FLUCONAZOLE 150 MG PO TABS: ORAL_TABLET | ORAL | 2 refills | Status: DC

## 2022-01-10 MED ORDER — DEXAMETHASONE SODIUM PHOSPHATE 10 MG/ML IJ SOLN
10.0000 mg | Freq: Once | INTRAMUSCULAR | Status: AC
  Administered 2022-01-10: 10 mg via INTRAVENOUS

## 2022-01-10 NOTE — Patient Instructions (Addendum)
Keep an eye on your blood pressures over the next couple of weeks and let me know if it stays higher than 120/80 after you start to feel better.   I have sent in an antibiotic for you to take twice a day for the next 5 days. I have also sent in diflucan in case you develop yeast symptoms.   We have given you a cocktail for the headache today. It has a combination of imitrex and steroid. If the headache does not get better by tomorrow please let me know.   If the neck pain doesn't improve please let me know. It is likely stress and tension in the neck. The steroid in the migraine medication should really help with that.

## 2022-01-10 NOTE — Progress Notes (Signed)
Worthy Keeler, DNP, AGNP-c [address] [place], [place] [address] [phone] Office [phone] Fax  ESTABLISHED PATIENT- Chronic Health and/or Follow-Up Visit  Blood pressure 134/82, pulse 84, height '5\' 4"'$  (1.626 m), weight 194 lb 11.2 oz (88.3 kg), SpO2 98 %.    Jaclyn Tucker is a 46 y.o. year old female presenting today for evaluation and management of the following: Acute Visit (Pt presents today for headache and BP high x 10 days)  High Blood Pressure Jaclyn Tucker tells me that she has had a headache for about 10 days. She did take her blood pressure at work and it was elevated at 140/81. Since then she has been monitoring her BP and it is in the upper 130's/80's to 140/80's. The headache is reportedly on the left eye and she has some tension in the right neck. Her normal BP in in the 110's/70's. She has not had any weakness or mental status changes, but she has had increased pressure around and behind the eye. No dizziness, CP, ShOB. Light only mildly bothers her, but sound is very bothersome. She has been under more stress lately due to a change at work. She has not had a headache last this long in the past.   All ROS negative with exception of what is listed above.   PHYSICAL EXAM Physical Exam Vitals and nursing note reviewed.  Constitutional:      General: She is not in acute distress.    Appearance: Normal appearance.  HENT:     Head: Normocephalic and atraumatic.     Right Ear: Tympanic membrane normal.     Left Ear: Tympanic membrane normal.     Nose:     Right Sinus: Maxillary sinus tenderness and frontal sinus tenderness present.     Left Sinus: Maxillary sinus tenderness and frontal sinus tenderness present.     Mouth/Throat:     Mouth: Mucous membranes are moist.     Pharynx: Oropharynx is clear.  Eyes:     Extraocular Movements: Extraocular movements intact.     Pupils: Pupils are equal, round, and  reactive to light.  Neck:     Vascular: No carotid bruit.  Cardiovascular:     Rate and Rhythm: Normal rate and regular rhythm.     Pulses: Normal pulses.     Heart sounds: Normal heart sounds.  Pulmonary:     Effort: Pulmonary effort is normal.     Breath sounds: Normal breath sounds.  Abdominal:     General: Bowel sounds are normal.     Palpations: Abdomen is soft.  Musculoskeletal:        General: Normal range of motion.     Cervical back: Normal range of motion. Tenderness present. No rigidity. Muscular tenderness present.     Right lower leg: No edema.     Left lower leg: No edema.  Lymphadenopathy:     Cervical: No cervical adenopathy.  Skin:    General: Skin is warm and dry.     Capillary Refill: Capillary refill takes less than 2 seconds.  Neurological:     General: No focal deficit present.     Mental Status: She is alert and oriented to person, place, and time.     Cranial Nerves: No cranial nerve deficit.     Sensory: No sensory deficit.     Motor: No weakness.     Coordination: Coordination normal.     Gait: Gait normal.  Deep Tendon Reflexes: Reflexes normal.  Psychiatric:        Mood and Affect: Mood normal.        Behavior: Behavior normal.        Thought Content: Thought content normal.        Judgment: Judgment normal.     PLAN Problem List Items Addressed This Visit     RESOLVED: Acute intractable headache - Primary   Acute non-recurrent pansinusitis    Sinus tenderness and pressure with HA consistent with sinusitis. Will send tx with abx today and monitor for symptom improvement.       Relevant Medications   fluconazole (DIFLUCAN) 150 MG tablet   Tension headache    Symptoms consistent with tension type headache, likely exacerbated by sinusitis. Will send treatment for infection and recommend treatment with medication for headache specific symptoms as well. Increase rest. If no improvement of symptoms recommend follow-up       Return if  symptoms worsen or fail to improve.   Worthy Keeler, DNP, AGNP-c 01/10/2022  1:41 PM

## 2022-01-23 ENCOUNTER — Encounter (HOSPITAL_BASED_OUTPATIENT_CLINIC_OR_DEPARTMENT_OTHER): Payer: Self-pay | Admitting: Nurse Practitioner

## 2022-01-26 ENCOUNTER — Other Ambulatory Visit (HOSPITAL_BASED_OUTPATIENT_CLINIC_OR_DEPARTMENT_OTHER): Payer: Self-pay | Admitting: Nurse Practitioner

## 2022-01-26 DIAGNOSIS — J0140 Acute pansinusitis, unspecified: Secondary | ICD-10-CM

## 2022-01-26 MED ORDER — DOXYCYCLINE HYCLATE 100 MG PO TABS: 100.0000 mg | ORAL_TABLET | Freq: Two times a day (BID) | ORAL | 0 refills | Status: DC

## 2022-02-14 DIAGNOSIS — G44209 Tension-type headache, unspecified, not intractable: Secondary | ICD-10-CM | POA: Insufficient documentation

## 2022-02-14 DIAGNOSIS — J0140 Acute pansinusitis, unspecified: Secondary | ICD-10-CM | POA: Insufficient documentation

## 2022-02-14 NOTE — Assessment & Plan Note (Signed)
Sinus tenderness and pressure with HA consistent with sinusitis. Will send tx with abx today and monitor for symptom improvement.

## 2022-02-14 NOTE — Assessment & Plan Note (Signed)
Symptoms consistent with tension type headache, likely exacerbated by sinusitis. Will send treatment for infection and recommend treatment with medication for headache specific symptoms as well. Increase rest. If no improvement of symptoms recommend follow-up

## 2022-02-15 ENCOUNTER — Ambulatory Visit
Admission: EM | Admit: 2022-02-15 | Discharge: 2022-02-15 | Disposition: A | Discharge status: Home / Self Care | Payer: BC Managed Care – PPO | Attending: Emergency Medicine | Admitting: Emergency Medicine

## 2022-02-15 DIAGNOSIS — M545 Low back pain, unspecified: Secondary | ICD-10-CM | POA: Diagnosis not present

## 2022-02-15 MED ORDER — BACLOFEN 10 MG PO TABS: 10.0000 mg | ORAL_TABLET | Freq: Every day | ORAL | 0 refills | Status: AC

## 2022-02-15 MED ORDER — IBUPROFEN 800 MG PO TABS: 800.0000 mg | ORAL_TABLET | Freq: Three times a day (TID) | ORAL | 0 refills | Status: AC | PRN
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
## 2022-02-15 NOTE — Discharge Instructions (Addendum)
I have ordered an x-ray of your lower back to be performed at the [place] location.  You do not need to be admitted at that location to have the x-ray done, please let them know that you are seen here in urgent care and the x-ray has been ordered for you because we did not have a radiology technician at our clinic when you were seen.  Once we receive the radiology report of your x-ray, we will contact you with results.  I provided you with a prescription for ibuprofen 800 mg that you can take 3 times daily as needed for pain.    I also provided you with a prescription for baclofen which is a very good muscle relaxer and antispasmodic that you can take at bedtime to help you get comfortable so you can sleep.    Please be advised: You CAN NOT take baclofen at the same time as Xanax.  When taken together, they may cause significant respiratory depression which could lead to death.  I recommend that you rest for a few days, I provided you with a note to be out of work in case you need one.  I also recommend that you avoid doing any stretching exercises or any exercises to strengthen your back until you are feeling completely better.  Please reach out to your primary care provider or return to urgent care if you are not feeling any better in the next 3 to 5 days.  Thank you for visiting urgent care today.  Please let us know presented the else we can do for you.
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
## 2022-02-22 DIAGNOSIS — F410 Panic disorder [episodic paroxysmal anxiety] without agoraphobia: Secondary | ICD-10-CM | POA: Diagnosis not present

## 2022-02-22 DIAGNOSIS — F411 Generalized anxiety disorder: Secondary | ICD-10-CM | POA: Diagnosis not present

## 2022-03-17 ENCOUNTER — Encounter (HOSPITAL_BASED_OUTPATIENT_CLINIC_OR_DEPARTMENT_OTHER): Payer: Self-pay

## 2022-03-17 ENCOUNTER — Ambulatory Visit (HOSPITAL_BASED_OUTPATIENT_CLINIC_OR_DEPARTMENT_OTHER)
Admission: RE | Admit: 2022-03-17 | Discharge: 2022-03-17 | Disposition: A | Discharge status: Home / Self Care | Payer: BC Managed Care – PPO | Source: Ambulatory Visit | Attending: Emergency Medicine | Admitting: Emergency Medicine

## 2022-03-17 DIAGNOSIS — M5136 Other intervertebral disc degeneration, lumbar region: Secondary | ICD-10-CM | POA: Insufficient documentation

## 2022-03-17 DIAGNOSIS — M545 Low back pain, unspecified: Secondary | ICD-10-CM | POA: Diagnosis not present

## 2022-03-17 DIAGNOSIS — Z041 Encounter for examination and observation following transport accident: Secondary | ICD-10-CM | POA: Insufficient documentation

## 2022-05-25 DIAGNOSIS — F410 Panic disorder [episodic paroxysmal anxiety] without agoraphobia: Secondary | ICD-10-CM | POA: Diagnosis not present

## 2022-05-25 DIAGNOSIS — F411 Generalized anxiety disorder: Secondary | ICD-10-CM | POA: Diagnosis not present

## 2022-06-13 ENCOUNTER — Encounter: Payer: Self-pay | Admitting: Nurse Practitioner

## 2022-06-13 ENCOUNTER — Ambulatory Visit: Payer: BC Managed Care – PPO | Admitting: Nurse Practitioner

## 2022-06-13 VITALS — BP 134/70 | HR 94 | Wt 200.6 lb

## 2022-06-13 DIAGNOSIS — D531 Other megaloblastic anemias, not elsewhere classified: Secondary | ICD-10-CM

## 2022-06-13 DIAGNOSIS — K219 Gastro-esophageal reflux disease without esophagitis: Secondary | ICD-10-CM

## 2022-06-13 DIAGNOSIS — D259 Leiomyoma of uterus, unspecified: Secondary | ICD-10-CM

## 2022-06-13 DIAGNOSIS — E559 Vitamin D deficiency, unspecified: Secondary | ICD-10-CM | POA: Diagnosis not present

## 2022-06-13 DIAGNOSIS — R110 Nausea: Secondary | ICD-10-CM

## 2022-06-13 DIAGNOSIS — Z6834 Body mass index (BMI) 34.0-34.9, adult: Secondary | ICD-10-CM | POA: Diagnosis not present

## 2022-06-13 DIAGNOSIS — R030 Elevated blood-pressure reading, without diagnosis of hypertension: Secondary | ICD-10-CM

## 2022-06-13 DIAGNOSIS — Z0000 Encounter for general adult medical examination without abnormal findings: Secondary | ICD-10-CM

## 2022-06-13 DIAGNOSIS — Z13220 Encounter for screening for lipoid disorders: Secondary | ICD-10-CM | POA: Diagnosis not present

## 2022-06-13 DIAGNOSIS — R635 Abnormal weight gain: Secondary | ICD-10-CM

## 2022-06-13 DIAGNOSIS — K760 Fatty (change of) liver, not elsewhere classified: Secondary | ICD-10-CM

## 2022-06-13 DIAGNOSIS — R790 Abnormal level of blood mineral: Secondary | ICD-10-CM | POA: Diagnosis not present

## 2022-06-13 MED ORDER — PANTOPRAZOLE SODIUM 40 MG PO TBEC
40.0000 mg | DELAYED_RELEASE_TABLET | Freq: Every day | ORAL | 3 refills | Status: DC
Start: 2022-06-13 — End: 2023-03-19

## 2022-06-13 MED ORDER — CYANOCOBALAMIN 1000 MCG/ML IJ SOLN
1000.0000 ug | INTRAMUSCULAR | 4 refills | Status: DC
Start: 2022-06-13 — End: 2023-08-07

## 2022-06-13 MED ORDER — ZEPBOUND 2.5 MG/0.5ML ~~LOC~~ SOAJ
2.5000 mg | SUBCUTANEOUS | 0 refills | Status: DC
Start: 2022-06-13 — End: 2022-06-20

## 2022-06-13 MED ORDER — ONDANSETRON 4 MG PO TBDP: 4.0000 mg | ORAL_TABLET | Freq: Three times a day (TID) | ORAL | 3 refills | Status: DC | PRN
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
## 2022-06-13 NOTE — Patient Instructions (Addendum)
I have sent in the Zepbound for you to try. I will get the prior authorization started and we will go from there.   For all adult patients, I recommend A well balanced diet low in saturated fats, cholesterol, and moderation in carbohydrates.   This can be as simple as monitoring portion sizes and cutting back on sugary beverages such as soda and juice to start with.    Daily water consumption of at least 64 ounces.  Physical activity at least 180 minutes per week, if just starting out.   This can be as simple as taking the stairs instead of the elevator and walking 2-3 laps around the office  purposefully every day.   STD protection, partner selection, and regular testing if high risk.  Limited consumption of alcoholic beverages if alcohol is consumed.  For women, I recommend no more than 7 alcoholic beverages per week, spread out throughout the week.  Avoid "binge" drinking or consuming large quantities of alcohol in one setting.   Please let me know if you feel you may need help with reduction or quitting alcohol consumption.   Avoidance of nicotine, if used.  Please let me know if you feel you may need help with reduction or quitting nicotine use.   Daily mental health attention.  This can be in the form of 5 minute daily meditation, prayer, journaling, yoga, reflection, etc.   Purposeful attention to your emotions and mental state can significantly improve your overall wellbeing  and  Health.  Please know that I am here to help you with all of your health care goals and am happy to work with you to find a solution that works best for you.  The greatest advice I have received with any changes in life are to take it one step at a time, that even means if all you can focus on is the next 60 seconds, then do that and celebrate your victories.  With any changes in life, you will have set backs, and that is OK. The important thing to remember is, if you have a set back, it is not a failure,  it is an opportunity to try again!  Health Maintenance Recommendations Screening Testing Mammogram Every 1 -2 years based on history and risk factors Starting at age 49 Pap Smear Ages 21-39 every 3 years Ages 28-65 every 5 years with HPV testing More frequent testing may be required based on results and history Colon Cancer Screening Every 1-10 years based on test performed, risk factors, and history Starting at age 66 Bone Density Screening Every 2-10 years based on history Starting at age 34 for women Recommendations for men differ based on medication usage, history, and risk factors AAA Screening One time ultrasound Men 67-62 years old who have every smoked Lung Cancer Screening Low Dose Lung CT every 12 months Age 48-80 years with a 30 pack-year smoking history who still smoke or who have quit within the last 15 years  Screening Labs Routine  Labs: Complete Blood Count (CBC), Complete Metabolic Panel (CMP), Cholesterol (Lipid Panel) Every 6-12 months based on history and medications May be recommended more frequently based on current conditions or previous results Hemoglobin A1c Lab Every 3-12 months based on history and previous results Starting at age 43 or earlier with diagnosis of diabetes, high cholesterol, BMI >26, and/or risk factors Frequent monitoring for patients with diabetes to ensure blood sugar control Thyroid Panel (TSH w/ T3 & T4) Every 6 months based on history,  symptoms, and risk factors May be repeated more often if on medication HIV One time testing for all patients 37 and older May be repeated more frequently for patients with increased risk factors or exposure Hepatitis C One time testing for all patients 34 and older May be repeated more frequently for patients with increased risk factors or exposure Gonorrhea, Chlamydia Every 12 months for all sexually active persons 13-24 years Additional monitoring may be recommended for those who are  considered high risk or who have symptoms PSA Men 5-67 years old with risk factors Additional screening may be recommended from age 21-69 based on risk factors, symptoms, and history  Vaccine Recommendations Tetanus Booster All adults every 10 years Flu Vaccine All patients 6 months and older every year COVID Vaccine All patients 12 years and older Initial dosing with booster May recommend additional booster based on age and health history HPV Vaccine 2 doses all patients age 32-26 Dosing may be considered for patients over 26 Shingles Vaccine (Shingrix) 2 doses all adults 68 years and older Pneumonia (Pneumovax 23) All adults 50 years and older May recommend earlier dosing based on health history Pneumonia (Prevnar 31) All adults 37 years and older Dosed 1 year after Pneumovax 23  Additional Screening, Testing, and Vaccinations may be recommended on an individualized basis based on family history, health history, risk factors, and/or exposure.

## 2022-06-14 LAB — COMPREHENSIVE METABOLIC PANEL
ALT: 32 IU/L (ref 0–32)
AST: 23 IU/L (ref 0–40)
Albumin/Globulin Ratio: 1.4 (ref 1.2–2.2)
Albumin: 4.2 g/dL (ref 3.9–4.9)
Alkaline Phosphatase: 83 IU/L (ref 44–121)
BUN/Creatinine Ratio: 13 (ref 9–23)
BUN: 11 mg/dL (ref 6–24)
Bilirubin Total: 0.5 mg/dL (ref 0.0–1.2)
CO2: 17 mmol/L — ABNORMAL LOW (ref 20–29)
Calcium: 9 mg/dL (ref 8.7–10.2)
Chloride: 105 mmol/L (ref 96–106)
Creatinine, Ser: 0.83 mg/dL (ref 0.57–1.00)
Globulin, Total: 3 g/dL (ref 1.5–4.5)
Glucose: 80 mg/dL (ref 70–99)
Potassium: 4.4 mmol/L (ref 3.5–5.2)
Sodium: 138 mmol/L (ref 134–144)
Total Protein: 7.2 g/dL (ref 6.0–8.5)
eGFR: 89 mL/min/{1.73_m2} (ref >59)

## 2022-06-14 LAB — LIPID PANEL
Chol/HDL Ratio: 4 ratio (ref 0.0–4.4)
Cholesterol, Total: 191 mg/dL (ref 100–199)
HDL: 48 mg/dL (ref >39)
LDL Chol Calc (NIH): 123 mg/dL — ABNORMAL HIGH (ref 0–99)
Triglycerides: 110 mg/dL (ref 0–149)
VLDL Cholesterol Cal: 20 mg/dL (ref 5–40)

## 2022-06-14 LAB — CBC WITH DIFFERENTIAL/PLATELET
Basophils Absolute: 0 10*3/uL (ref 0.0–0.2)
Basos: 1 %
EOS (ABSOLUTE): 0.2 10*3/uL (ref 0.0–0.4)
Eos: 2 %
Hematocrit: 42.8 % (ref 34.0–46.6)
Hemoglobin: 14.2 g/dL (ref 11.1–15.9)
Immature Grans (Abs): 0 10*3/uL (ref 0.0–0.1)
Immature Granulocytes: 0 %
Lymphocytes Absolute: 2.5 10*3/uL (ref 0.7–3.1)
Lymphs: 33 %
MCH: 29.1 pg (ref 26.6–33.0)
MCHC: 33.2 g/dL (ref 31.5–35.7)
MCV: 88 fL (ref 79–97)
Monocytes Absolute: 0.5 10*3/uL (ref 0.1–0.9)
Monocytes: 6 %
Neutrophils Absolute: 4.4 10*3/uL (ref 1.4–7.0)
Neutrophils: 58 %
Platelets: 337 10*3/uL (ref 150–450)
RBC: 4.88 x10E6/uL (ref 3.77–5.28)
RDW: 13 % (ref 11.7–15.4)
WBC: 7.5 10*3/uL (ref 3.4–10.8)

## 2022-06-14 LAB — TSH: TSH: 1.11 u[IU]/mL (ref 0.450–4.500)

## 2022-06-14 LAB — HEMOGLOBIN A1C
Est. average glucose Bld gHb Est-mCnc: 114 mg/dL
Hgb A1c MFr Bld: 5.6 % (ref 4.8–5.6)

## 2022-06-14 LAB — VITAMIN D 25 HYDROXY (VIT D DEFICIENCY, FRACTURES): Vit D, 25-Hydroxy: 15.7 ng/mL — ABNORMAL LOW (ref 30.0–100.0)

## 2022-06-15 MED ORDER — VITAMIN D3 1.25 MG (50000 UT) PO TABS
1.0000 | ORAL_TABLET | ORAL | 3 refills | Status: AC
Start: 2022-06-15 — End: ?

## 2022-06-16 ENCOUNTER — Telehealth: Payer: Self-pay

## 2022-06-16 NOTE — Telephone Encounter (Signed)
Jaclyn Tucker called and stated she needs a PA done for her zepbound for quantity and prescription. She says they are also asking for a PLA? And provided phone number to call about that if you have any questions [phone]

## 2022-06-16 NOTE — Telephone Encounter (Signed)
PA approved through 02/11/23.

## 2022-06-16 NOTE — Telephone Encounter (Signed)
PA for [facility] started through [facility]. Waiting for results.

## 2022-06-19 ENCOUNTER — Encounter: Payer: Self-pay | Admitting: Nurse Practitioner

## 2022-06-19 DIAGNOSIS — K760 Fatty (change of) liver, not elsewhere classified: Secondary | ICD-10-CM

## 2022-06-19 DIAGNOSIS — R030 Elevated blood-pressure reading, without diagnosis of hypertension: Secondary | ICD-10-CM

## 2022-06-19 DIAGNOSIS — Z6834 Body mass index (BMI) 34.0-34.9, adult: Secondary | ICD-10-CM

## 2022-06-19 NOTE — Assessment & Plan Note (Signed)
Difficulty with management of her fibroids and heavy menstrual bleeding. She experiences heavy bleeding and is considering changing her gynecologist. Recommend close monitoring and report if new or worsening bleeding starts.

## 2022-06-19 NOTE — Assessment & Plan Note (Signed)
Tekelia requests to switch from Omeprazole to Protonix for her acid reflux due to increased anxiety and lack of good control with Omeprazole. Plan: - medication changed to Protonix.

## 2022-06-19 NOTE — Assessment & Plan Note (Signed)
Refills needed. Labs pending

## 2022-06-19 NOTE — Assessment & Plan Note (Signed)

## 2022-06-19 NOTE — Assessment & Plan Note (Signed)
>>  ASSESSMENT AND PLAN FOR WEIGHT GAIN WRITTEN ON 06/19/2022  8:36 PM BY Hartford Maulden E, NP  Jaclyn Tucker is concerned about her weight and its health implications. She has researched medication options for weight loss and is interested in exploring these. Plan: - Discuss insurance coverage for zepbound  and initiate the prior authorization process. - Educate Jaclyn Tucker on potential side effects of Zepbound  and management strategies. - Provide instructions on Zepbounduse, including injection technique and storage.

## 2022-06-19 NOTE — Assessment & Plan Note (Signed)
Discussion of weight management and options that may be helpful. Avoid medications that may be toxic to the liver. Labs pending.

## 2022-06-19 NOTE — Assessment & Plan Note (Signed)
Jaclyn Tucker is concerned about her weight and its health implications. She has researched medication options for weight loss and is interested in exploring these. Plan: - Discuss insurance coverage for zepbound and initiate the prior authorization process. - Educate [name] on potential side effects of Zepbound and management strategies. - Provide instructions on Zepbounduse, including injection technique and storage.

## 2022-06-20 MED ORDER — ZEPBOUND 5 MG/0.5ML ~~LOC~~ SOAJ
5.0000 mg | SUBCUTANEOUS | 1 refills | Status: DC
Start: 2022-06-20 — End: 2022-06-30

## 2022-06-28 NOTE — Telephone Encounter (Signed)
P.A. is approved and Bre is going to call pharmacy

## 2022-06-30 ENCOUNTER — Other Ambulatory Visit (HOSPITAL_BASED_OUTPATIENT_CLINIC_OR_DEPARTMENT_OTHER): Payer: Self-pay

## 2022-06-30 MED ORDER — ZEPBOUND 5 MG/0.5ML ~~LOC~~ SOAJ
5.0000 mg | SUBCUTANEOUS | 1 refills | Status: DC
Start: 2022-06-30 — End: 2022-07-13
  Filled 2022-06-30: qty 2, 28d supply, fill #0

## 2022-06-30 NOTE — Telephone Encounter (Signed)
[facility] has this in stock if you would like to send the prescription to them.

## 2022-06-30 NOTE — Addendum Note (Signed)
Addended by: Kian Ottaviano, Hunt[place] Dec E on: 06/30/2022 06:21 PM   Modules accepted: Orders

## 2022-07-01 ENCOUNTER — Other Ambulatory Visit (HOSPITAL_BASED_OUTPATIENT_CLINIC_OR_DEPARTMENT_OTHER): Payer: Self-pay

## 2022-07-03 ENCOUNTER — Other Ambulatory Visit (HOSPITAL_BASED_OUTPATIENT_CLINIC_OR_DEPARTMENT_OTHER): Payer: Self-pay

## 2022-07-03 ENCOUNTER — Telehealth: Payer: Self-pay

## 2022-07-03 NOTE — Telephone Encounter (Signed)
Zepbound 2.5 has been approved through [facility] until 02/11/23

## 2022-07-04 ENCOUNTER — Other Ambulatory Visit: Payer: Self-pay

## 2022-07-06 ENCOUNTER — Other Ambulatory Visit: Payer: Self-pay

## 2022-07-06 ENCOUNTER — Other Ambulatory Visit (HOSPITAL_BASED_OUTPATIENT_CLINIC_OR_DEPARTMENT_OTHER): Payer: Self-pay

## 2022-07-06 DIAGNOSIS — D531 Other megaloblastic anemias, not elsewhere classified: Secondary | ICD-10-CM

## 2022-07-06 DIAGNOSIS — Z6834 Body mass index (BMI) 34.0-34.9, adult: Secondary | ICD-10-CM

## 2022-07-06 DIAGNOSIS — E559 Vitamin D deficiency, unspecified: Secondary | ICD-10-CM

## 2022-07-06 DIAGNOSIS — R030 Elevated blood-pressure reading, without diagnosis of hypertension: Secondary | ICD-10-CM

## 2022-07-06 DIAGNOSIS — Z0000 Encounter for general adult medical examination without abnormal findings: Secondary | ICD-10-CM

## 2022-07-06 DIAGNOSIS — K760 Fatty (change of) liver, not elsewhere classified: Secondary | ICD-10-CM

## 2022-07-06 DIAGNOSIS — R790 Abnormal level of blood mineral: Secondary | ICD-10-CM

## 2022-07-06 MED ORDER — ZEPBOUND 2.5 MG/0.5ML ~~LOC~~ SOAJ
2.5000 mg | SUBCUTANEOUS | 0 refills | Status: DC
Start: 2022-07-06 — End: 2022-07-07

## 2022-07-07 ENCOUNTER — Other Ambulatory Visit: Payer: Self-pay

## 2022-07-07 DIAGNOSIS — E559 Vitamin D deficiency, unspecified: Secondary | ICD-10-CM

## 2022-07-07 DIAGNOSIS — Z6834 Body mass index (BMI) 34.0-34.9, adult: Secondary | ICD-10-CM

## 2022-07-07 DIAGNOSIS — R030 Elevated blood-pressure reading, without diagnosis of hypertension: Secondary | ICD-10-CM

## 2022-07-07 DIAGNOSIS — Z0000 Encounter for general adult medical examination without abnormal findings: Secondary | ICD-10-CM

## 2022-07-07 DIAGNOSIS — R790 Abnormal level of blood mineral: Secondary | ICD-10-CM

## 2022-07-07 DIAGNOSIS — D531 Other megaloblastic anemias, not elsewhere classified: Secondary | ICD-10-CM

## 2022-07-07 DIAGNOSIS — K760 Fatty (change of) liver, not elsewhere classified: Secondary | ICD-10-CM

## 2022-07-07 MED ORDER — ZEPBOUND 2.5 MG/0.5ML ~~LOC~~ SOAJ
2.5000 mg | SUBCUTANEOUS | 0 refills | Status: DC
Start: 2022-07-07 — End: 2022-07-13

## 2022-07-10 NOTE — Telephone Encounter (Signed)
Zepbound 2.5 has been approved through [facility] until 02/11/23  Bre looks like you already got an approval on this, can you call the pharmacy & see if there is still an issue,  usually after you tet an approval for the first dosage it automatically approves for the increased dosages.

## 2022-07-13 ENCOUNTER — Telehealth: Payer: Self-pay | Admitting: Nurse Practitioner

## 2022-07-13 DIAGNOSIS — K760 Fatty (change of) liver, not elsewhere classified: Secondary | ICD-10-CM

## 2022-07-13 DIAGNOSIS — D531 Other megaloblastic anemias, not elsewhere classified: Secondary | ICD-10-CM

## 2022-07-13 DIAGNOSIS — Z0000 Encounter for general adult medical examination without abnormal findings: Secondary | ICD-10-CM

## 2022-07-13 DIAGNOSIS — R790 Abnormal level of blood mineral: Secondary | ICD-10-CM

## 2022-07-13 DIAGNOSIS — R030 Elevated blood-pressure reading, without diagnosis of hypertension: Secondary | ICD-10-CM

## 2022-07-13 DIAGNOSIS — E559 Vitamin D deficiency, unspecified: Secondary | ICD-10-CM

## 2022-07-13 DIAGNOSIS — Z6834 Body mass index (BMI) 34.0-34.9, adult: Secondary | ICD-10-CM

## 2022-07-13 MED ORDER — ZEPBOUND 2.5 MG/0.5ML ~~LOC~~ SOAJ
2.5000 mg | SUBCUTANEOUS | 0 refills | Status: DC
Start: 2022-07-13 — End: 2022-07-19

## 2022-07-13 NOTE — Telephone Encounter (Signed)
Changed and resent zep[facility]mg  [facility].

## 2022-07-13 NOTE — Telephone Encounter (Signed)
[facility] for [facility]epound 2.5 pt did not get it with [facility] please [facility]

## 2022-07-14 ENCOUNTER — Telehealth: Payer: Self-pay

## 2022-07-14 NOTE — Telephone Encounter (Signed)
Pt called and advised [facility] needed a PLA for Zepbound. Insurance will only provide one 2.5mg  per 12 months. A PLA was needed for the pt to receive an additional 2.5mg  as the pharmacies are low in supply of the higher doses. I completed the PLA and it was denied due to the pt not missing a dose. The pt called and spoke with the supervisor of her insurance and was advised the med would be approved with an appeal.

## 2022-07-18 ENCOUNTER — Encounter: Payer: Self-pay | Admitting: Nurse Practitioner

## 2022-07-18 ENCOUNTER — Other Ambulatory Visit: Payer: Self-pay | Admitting: Nurse Practitioner

## 2022-07-18 ENCOUNTER — Other Ambulatory Visit (HOSPITAL_BASED_OUTPATIENT_CLINIC_OR_DEPARTMENT_OTHER): Payer: Self-pay

## 2022-07-18 DIAGNOSIS — K760 Fatty (change of) liver, not elsewhere classified: Secondary | ICD-10-CM

## 2022-07-18 DIAGNOSIS — R030 Elevated blood-pressure reading, without diagnosis of hypertension: Secondary | ICD-10-CM

## 2022-07-18 DIAGNOSIS — Z6834 Body mass index (BMI) 34.0-34.9, adult: Secondary | ICD-10-CM

## 2022-07-19 ENCOUNTER — Other Ambulatory Visit: Payer: Self-pay

## 2022-07-19 ENCOUNTER — Other Ambulatory Visit (HOSPITAL_BASED_OUTPATIENT_CLINIC_OR_DEPARTMENT_OTHER): Payer: Self-pay

## 2022-07-19 DIAGNOSIS — K760 Fatty (change of) liver, not elsewhere classified: Secondary | ICD-10-CM

## 2022-07-19 DIAGNOSIS — R030 Elevated blood-pressure reading, without diagnosis of hypertension: Secondary | ICD-10-CM

## 2022-07-19 DIAGNOSIS — Z6834 Body mass index (BMI) 34.0-34.9, adult: Secondary | ICD-10-CM

## 2022-07-19 MED ORDER — ZEPBOUND 5 MG/0.5ML ~~LOC~~ SOAJ
5.0000 mg | SUBCUTANEOUS | 1 refills | Status: DC
Start: 2022-07-19 — End: 2022-08-11
  Filled 2022-07-19: qty 2, 28d supply, fill #0

## 2022-07-21 ENCOUNTER — Other Ambulatory Visit (HOSPITAL_BASED_OUTPATIENT_CLINIC_OR_DEPARTMENT_OTHER): Payer: Self-pay

## 2022-08-02 ENCOUNTER — Other Ambulatory Visit (HOSPITAL_BASED_OUTPATIENT_CLINIC_OR_DEPARTMENT_OTHER): Payer: Self-pay

## 2022-08-02 NOTE — Telephone Encounter (Signed)
Called pharmacy & that have the Zepbound 5 mg in & pt already picked up so issue resolved

## 2022-08-03 ENCOUNTER — Encounter: Payer: Self-pay | Admitting: Nurse Practitioner

## 2022-08-11 ENCOUNTER — Other Ambulatory Visit: Payer: Self-pay

## 2022-08-11 ENCOUNTER — Other Ambulatory Visit: Payer: Self-pay | Admitting: Nurse Practitioner

## 2022-08-11 ENCOUNTER — Other Ambulatory Visit (HOSPITAL_BASED_OUTPATIENT_CLINIC_OR_DEPARTMENT_OTHER): Payer: Self-pay

## 2022-08-11 DIAGNOSIS — K760 Fatty (change of) liver, not elsewhere classified: Secondary | ICD-10-CM

## 2022-08-11 DIAGNOSIS — R030 Elevated blood-pressure reading, without diagnosis of hypertension: Secondary | ICD-10-CM

## 2022-08-11 DIAGNOSIS — Z6834 Body mass index (BMI) 34.0-34.9, adult: Secondary | ICD-10-CM

## 2022-08-11 MED ORDER — ZEPBOUND 5 MG/0.5ML ~~LOC~~ SOAJ
5.0000 mg | SUBCUTANEOUS | 1 refills | Status: DC
Start: 2022-08-11 — End: 2022-12-28

## 2022-08-11 MED ORDER — ZEPBOUND 5 MG/0.5ML ~~LOC~~ SOAJ
5.0000 mg | SUBCUTANEOUS | 0 refills | Status: DC
Start: 2022-08-11 — End: 2022-12-28
  Filled 2022-08-11 (×3): qty 2, 28d supply, fill #0

## 2022-08-24 DIAGNOSIS — F411 Generalized anxiety disorder: Secondary | ICD-10-CM | POA: Diagnosis not present

## 2022-08-24 DIAGNOSIS — F410 Panic disorder [episodic paroxysmal anxiety] without agoraphobia: Secondary | ICD-10-CM | POA: Diagnosis not present

## 2022-10-04 DIAGNOSIS — Z1231 Encounter for screening mammogram for malignant neoplasm of breast: Secondary | ICD-10-CM | POA: Diagnosis not present

## 2022-10-04 DIAGNOSIS — Z124 Encounter for screening for malignant neoplasm of cervix: Secondary | ICD-10-CM | POA: Diagnosis not present

## 2022-10-04 DIAGNOSIS — Z01419 Encounter for gynecological examination (general) (routine) without abnormal findings: Secondary | ICD-10-CM | POA: Diagnosis not present

## 2022-10-04 DIAGNOSIS — Z6828 Body mass index (BMI) 28.0-28.9, adult: Secondary | ICD-10-CM | POA: Diagnosis not present

## 2022-10-06 ENCOUNTER — Other Ambulatory Visit (HOSPITAL_BASED_OUTPATIENT_CLINIC_OR_DEPARTMENT_OTHER): Payer: Self-pay

## 2022-10-11 LAB — HM PAP SMEAR: HPV, high-risk: NEGATIVE

## 2022-10-30 ENCOUNTER — Encounter: Payer: Self-pay | Admitting: Nurse Practitioner

## 2022-11-08 ENCOUNTER — Telehealth: Payer: Self-pay | Admitting: Nurse Practitioner

## 2022-12-06 DIAGNOSIS — F410 Panic disorder [episodic paroxysmal anxiety] without agoraphobia: Secondary | ICD-10-CM | POA: Diagnosis not present

## 2022-12-06 DIAGNOSIS — F411 Generalized anxiety disorder: Secondary | ICD-10-CM | POA: Diagnosis not present

## 2022-12-22 ENCOUNTER — Encounter: Payer: Self-pay | Admitting: Nurse Practitioner

## 2022-12-22 DIAGNOSIS — R635 Abnormal weight gain: Secondary | ICD-10-CM

## 2022-12-22 DIAGNOSIS — K760 Fatty (change of) liver, not elsewhere classified: Secondary | ICD-10-CM

## 2022-12-28 ENCOUNTER — Other Ambulatory Visit: Payer: Self-pay | Admitting: Nurse Practitioner

## 2023-01-01 MED ORDER — TIRZEPATIDE-WEIGHT MANAGEMENT 5 MG/0.5ML ~~LOC~~ SOLN
5.0000 mg | SUBCUTANEOUS | 0 refills | Status: DC
Start: 2023-01-01 — End: 2023-01-02

## 2023-01-02 ENCOUNTER — Telehealth: Payer: Self-pay | Admitting: Nurse Practitioner

## 2023-01-02 MED ORDER — ZEPBOUND 5 MG/0.5ML ~~LOC~~ SOAJ: 5.0000 mg | SUBCUTANEOUS | 0 refills | Status: DC

## 2023-01-02 NOTE — Telephone Encounter (Signed)
Pt called & states [facility] says Zepbound requiring P.A. even though P.A. was approved until December she said, I checked & vial was sent in by mistake & she usually gets pen.  Changed & resent in & she will call them & have then reprocess as pen.

## 2023-01-05 NOTE — Telephone Encounter (Signed)
done

## 2023-03-01 DIAGNOSIS — F410 Panic disorder [episodic paroxysmal anxiety] without agoraphobia: Secondary | ICD-10-CM | POA: Diagnosis not present

## 2023-03-01 DIAGNOSIS — F411 Generalized anxiety disorder: Secondary | ICD-10-CM | POA: Diagnosis not present

## 2023-03-19 ENCOUNTER — Encounter: Payer: Self-pay | Admitting: Nurse Practitioner

## 2023-03-19 ENCOUNTER — Ambulatory Visit: Payer: BC Managed Care – PPO | Admitting: Nurse Practitioner

## 2023-03-19 VITALS — BP 118/78 | HR 94 | Wt 139.6 lb

## 2023-03-19 DIAGNOSIS — K760 Fatty (change of) liver, not elsewhere classified: Secondary | ICD-10-CM

## 2023-03-19 DIAGNOSIS — E88819 Insulin resistance, unspecified: Secondary | ICD-10-CM | POA: Diagnosis not present

## 2023-03-19 DIAGNOSIS — Z6834 Body mass index (BMI) 34.0-34.9, adult: Secondary | ICD-10-CM

## 2023-03-19 DIAGNOSIS — M797 Fibromyalgia: Secondary | ICD-10-CM

## 2023-03-19 DIAGNOSIS — D531 Other megaloblastic anemias, not elsewhere classified: Secondary | ICD-10-CM | POA: Diagnosis not present

## 2023-03-19 DIAGNOSIS — F411 Generalized anxiety disorder: Secondary | ICD-10-CM

## 2023-03-19 DIAGNOSIS — R030 Elevated blood-pressure reading, without diagnosis of hypertension: Secondary | ICD-10-CM | POA: Diagnosis not present

## 2023-03-19 DIAGNOSIS — E782 Mixed hyperlipidemia: Secondary | ICD-10-CM

## 2023-03-19 DIAGNOSIS — E559 Vitamin D deficiency, unspecified: Secondary | ICD-10-CM

## 2023-03-19 DIAGNOSIS — Z91013 Allergy to seafood: Secondary | ICD-10-CM

## 2023-03-19 MED ORDER — EPINEPHRINE 0.3 MG/0.3ML IJ SOAJ: 0.3000 mg | INTRAMUSCULAR | 1 refills | Status: AC | PRN

## 2023-03-19 MED ORDER — ZEPBOUND 5 MG/0.5ML ~~LOC~~ SOAJ
5.0000 mg | SUBCUTANEOUS | 1 refills | Status: DC
Start: 2023-03-19 — End: 2023-06-11

## 2023-03-19 NOTE — Progress Notes (Signed)
 Jaclyn Doing, DNP, AGNP-c [facility]  [address] [place], [place] [address] [phone]  ESTABLISHED PATIENT- Chronic Health and/or Follow-Up Visit  Blood pressure 118/78, pulse 94, weight 139 lb 9.6 oz (63.3 kg), last menstrual period 02/26/2023.    Jaclyn Tucker is a 48 y.o. year old female presenting today for evaluation and management of chronic conditions.   History of Present Illness [name] presents for a follow-up visit regarding her weight management and the effects of Zepbound . She reports a significant weight loss from 200 pounds to 139 pounds, with a corresponding drop in BMI from 34.43 to 23. She attributes this weight loss to the Zepbound  treatment and lifestyle modifications, including dietary changes and increased protein intake.  Jaclyn Tucker describes a marked improvement in her overall health since starting Zepbound . She reports a decrease in blood pressure, cessation of acid reflux symptoms, and an improvement in her previously diagnosed insulin resistance. She also notes a reduction in anxiety symptoms, which she attributes to the overall improvement in her health status.  She has a family history of type 2 diabetes and fatty liver disease, which she believes contributed to her previous health issues. She expresses a strong belief in the effectiveness of Zepbound  in addressing these issues, and she expresses a desire to continue the medication for its perceived benefits.   She denies any current nausea or other significant side effects from the medication.  All ROS negative with exception of what is listed above.   PHYSICAL EXAM Physical Exam Vitals and nursing note reviewed.  Constitutional:      Appearance: Normal appearance.  HENT:     Head: Normocephalic.  Eyes:     Pupils: Pupils are equal, round, and reactive to light.  Neck:     Vascular: No carotid bruit.  Cardiovascular:     Rate and Rhythm: Normal rate and regular rhythm.      Pulses: Normal pulses.     Heart sounds: Normal heart sounds.  Pulmonary:     Effort: Pulmonary effort is normal.     Breath sounds: Normal breath sounds.  Abdominal:     General: Bowel sounds are normal. There is no distension.     Palpations: Abdomen is soft.     Tenderness: There is no abdominal tenderness. There is no guarding.  Musculoskeletal:        General: Normal range of motion.     Cervical back: Normal range of motion and neck supple.     Right lower leg: No edema.     Left lower leg: No edema.  Lymphadenopathy:     Cervical: No cervical adenopathy.  Skin:    General: Skin is warm.     Capillary Refill: Capillary refill takes less than 2 seconds.  Neurological:     General: No focal deficit present.     Mental Status: She is alert and oriented to person, place, and time.     Motor: No weakness.  Psychiatric:        Mood and Affect: Mood normal.      PLAN Problem List Items Addressed This Visit     Generalized anxiety disorder   Significant improvement with fewer and shorter episodes. No recent use of Xanax in the past 3-4 months. Discussed impact of weight loss and improved health on anxiety levels. - Continue current management - Monitor for any changes in anxiety symptoms       Fibromyalgia   Symptoms resolved with weight management.  Fatty liver   Significant improvement with weight loss and Zepbound . Discussed potential for reversal with continued weight management and benefits in reducing cirrhosis risk. 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 Current EpiPen  is cloudy and needs replacement. Discussed importance of carrying Benadryl  and famotidine for allergic reactions. - Prescribe new EpiPen  - Advise carrying Benadryl  and famotidine for allergic reactions      Relevant Medications   EPINEPHrine  0.3 mg/0.3 mL IJ SOAJ injection   Elevated blood pressure reading in office without diagnosis of hypertension   Blood pressure well-controlled, previously in the 140s. No current symptoms of tachycardia. Discussed correlation between weight loss and improved blood pressure. - Continue monitoring blood pressure      Relevant Medications   tirzepatide  (ZEPBOUND ) 5 MG/0.5ML Pen   Other Relevant Orders   Hemoglobin A1c (Completed)   CBC with Differential/Platelet (Completed)   Comprehensive metabolic panel (Completed)   Lipid panel (Completed)   Vitamin B12 (Completed)   VITAMIN D  25 Hydroxy (Vit-D Deficiency, Fractures) (Completed)   Insulin resistance   Improved insulin sensitivity with Zepbound , leading to better blood sugar regulation and reduced symptoms such as shakiness and cravings. Discussed genetic predisposition to insulin resistance and type 2 diabetes. - Continue Zepbound  5 mg - Monitor blood sugar levels      Relevant Medications   tirzepatide  (ZEPBOUND ) 5 MG/0.5ML Pen   Other Relevant Orders   Hemoglobin A1c (Completed)   CBC with Differential/Platelet (Completed)   Comprehensive metabolic panel (Completed)   Lipid panel (Completed)   Vitamin B12 (Completed)   VITAMIN D  25 Hydroxy (Vit-D Deficiency, Fractures) (Completed)   BMI 34.0-34.9,adult - Primary   Significant weight loss from 200 lbs to 139 lbs, BMI reduction from 34.43 to 23. Improved overall health, including reduced blood pressure and resolution of acid reflux. Attributed to Zepbound , which aids in managing insulin resistance and reducing cravings. Discussed Zepbound 's mechanism of action on GLP and GIP enzymes, leading  to better blood sugar regulation and reduced insulin resistance. Benefits include significant weight loss, improved blood pressure, and reduced risk of heart attack and stroke. - Continue Zepbound  5 mg - Submit prior authorization for continued use of Zepbound  - Monitor weight and adjust dosage if necessary       Relevant Medications   tirzepatide  (ZEPBOUND ) 5 MG/0.5ML Pen   Other Relevant Orders   Hemoglobin A1c (Completed)   CBC with Differential/Platelet (Completed)   Comprehensive metabolic panel (Completed)   Lipid panel (Completed)   Vitamin B12 (Completed)   VITAMIN D  25 Hydroxy (Vit-D Deficiency, Fractures) (Completed)   Mixed hyperlipidemia   Monitor labs today. Improvement in weight, diet, and exercise through medication and lifestyle changes. Will make changes as necessary.       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  Jaclyn Doing, DNP, AGNP-c

## 2023-03-19 NOTE — Patient Instructions (Addendum)
 YOU ARE DOING AMAZING!!!!! I am so proud of you!!!  We will work on the PA to continue with the medication   We will be sure to include the loss that you have shows, the family history, the benefits we have seen, etc.   I have sent in refills of your Zepbound  and Epipen .   If you should have any further bee sings or bites:  Take benadryl  immediately (50mg ) Take famotidine (pepcid) 40mg  twice a day until the swelling is better.

## 2023-03-20 ENCOUNTER — Other Ambulatory Visit (HOSPITAL_COMMUNITY): Payer: Self-pay

## 2023-03-20 ENCOUNTER — Telehealth: Payer: Self-pay

## 2023-03-20 LAB — VITAMIN D 25 HYDROXY (VIT D DEFICIENCY, FRACTURES): Vit D, 25-Hydroxy: 54.5 ng/mL (ref 30.0–100.0)

## 2023-03-20 LAB — CBC WITH DIFFERENTIAL/PLATELET
Basophils Absolute: 0 10*3/uL (ref 0.0–0.2)
Basos: 1 %
EOS (ABSOLUTE): 0.1 10*3/uL (ref 0.0–0.4)
Eos: 1 %
Hematocrit: 43.5 % (ref 34.0–46.6)
Hemoglobin: 14.3 g/dL (ref 11.1–15.9)
Immature Grans (Abs): 0 10*3/uL (ref 0.0–0.1)
Immature Granulocytes: 0 %
Lymphocytes Absolute: 2.1 10*3/uL (ref 0.7–3.1)
Lymphs: 35 %
MCH: 29 pg (ref 26.6–33.0)
MCHC: 32.9 g/dL (ref 31.5–35.7)
MCV: 88 fL (ref 79–97)
Monocytes Absolute: 0.3 10*3/uL (ref 0.1–0.9)
Monocytes: 5 %
Neutrophils Absolute: 3.4 10*3/uL (ref 1.4–7.0)
Neutrophils: 58 %
Platelets: 336 10*3/uL (ref 150–450)
RBC: 4.93 x10E6/uL (ref 3.77–5.28)
RDW: 12.6 % (ref 11.7–15.4)
WBC: 5.9 10*3/uL (ref 3.4–10.8)

## 2023-03-20 LAB — COMPREHENSIVE METABOLIC PANEL
ALT: 9 [IU]/L (ref 0–32)
AST: 12 [IU]/L (ref 0–40)
Albumin: 4.3 g/dL (ref 3.9–4.9)
Alkaline Phosphatase: 70 [IU]/L (ref 44–121)
BUN/Creatinine Ratio: 9 (ref 9–23)
BUN: 8 mg/dL (ref 6–24)
Bilirubin Total: 0.4 mg/dL (ref 0.0–1.2)
CO2: 22 mmol/L (ref 20–29)
Calcium: 9.2 mg/dL (ref 8.7–10.2)
Chloride: 105 mmol/L (ref 96–106)
Creatinine, Ser: 0.88 mg/dL (ref 0.57–1.00)
Globulin, Total: 3 g/dL (ref 1.5–4.5)
Glucose: 78 mg/dL (ref 70–99)
Potassium: 4.7 mmol/L (ref 3.5–5.2)
Sodium: 139 mmol/L (ref 134–144)
Total Protein: 7.3 g/dL (ref 6.0–8.5)
eGFR: 83 mL/min/{1.73_m2} (ref >59)

## 2023-03-20 LAB — HEMOGLOBIN A1C
Est. average glucose Bld gHb Est-mCnc: 103 mg/dL
Hgb A1c MFr Bld: 5.2 % (ref 4.8–5.6)

## 2023-03-20 LAB — LIPID PANEL
Chol/HDL Ratio: 4.3 {ratio} (ref 0.0–4.4)
Cholesterol, Total: 193 mg/dL (ref 100–199)
HDL: 45 mg/dL (ref >39)
LDL Chol Calc (NIH): 134 mg/dL — ABNORMAL HIGH (ref 0–99)
Triglycerides: 76 mg/dL (ref 0–149)
VLDL Cholesterol Cal: 14 mg/dL (ref 5–40)

## 2023-03-20 LAB — VITAMIN B12: Vitamin B-12: 652 pg/mL (ref 232–1245)

## 2023-03-20 NOTE — Telephone Encounter (Signed)
 Pharmacy Patient Advocate Encounter   Received notification from Fax /pts plan [facility] that prior authorization/addt info for Zepbound   is required/requested.   Insurance verification completed.   The patient is insured through [employer] .   Per test claim: PA required; PA submitted to above mentioned insurance via Fax Key/confirmation #/EOC case id [id number] Status is pending

## 2023-03-21 ENCOUNTER — Other Ambulatory Visit (HOSPITAL_COMMUNITY): Payer: Self-pay

## 2023-03-21 NOTE — Telephone Encounter (Signed)
 Pharmacy Patient Advocate Encounter  Received notification from [facility] that Prior Authorization for Zepbound  has been APPROVED from 12.8.24 to 1.7.26. Ran test claim, Copay is $RTS AND IS PAYABLE AGAIN ON/AFTER 3.9.25. This test claim was processed through [facility]- copay amounts may vary at other pharmacies due to pharmacy/plan contracts, or as the patient moves through the different stages of their insurance plan.

## 2023-03-23 NOTE — Assessment & Plan Note (Addendum)
 Blood pressure well-controlled, previously in the 140s. No current symptoms of tachycardia. Discussed correlation between weight loss and improved blood pressure. - Continue monitoring blood pressure

## 2023-03-23 NOTE — Assessment & Plan Note (Signed)
 Severe allergy with history of anaphylactic reactions. Current EpiPen  is cloudy and needs replacement. Discussed importance of carrying Benadryl  and famotidine for allergic reactions. - Prescribe new EpiPen  - Advise carrying Benadryl  and famotidine for allergic reactions

## 2023-03-23 NOTE — Assessment & Plan Note (Signed)
 Significant improvement with fewer and shorter episodes. No recent use of Xanax in the past 3-4 months. Discussed impact of weight loss and improved health on anxiety levels. - Continue current management - Monitor for any changes in anxiety symptoms

## 2023-03-23 NOTE — Assessment & Plan Note (Signed)
 Symptoms resolved with weight management.

## 2023-03-23 NOTE — Assessment & Plan Note (Addendum)
 Significant weight loss from 200 lbs to 139 lbs, BMI reduction from 34.43 to 23. Improved overall health, including reduced blood pressure, reduction in chronic pain, reduced cholesterol, normalized liver function tests, improved anxiety, and resolution of acid reflux. Attributed to Zepbound , diet, and exercise. Discussed Zepbound 's mechanism of action on GLP and GIP enzymes, leading to better blood sugar regulation and reduced insulin resistance. Benefits include previous mentioned changes and educed risk of heart attack and stroke. - Continue Zepbound  5 mg - Submit prior authorization for continued use of Zepbound  - Monitor weight and adjust dosage if necessary

## 2023-03-23 NOTE — Assessment & Plan Note (Signed)
 Improved insulin sensitivity with Zepbound , leading to better blood sugar regulation and reduced symptoms such as shakiness and cravings. Discussed genetic predisposition to insulin resistance and type 2 diabetes. - Continue Zepbound  5 mg - Monitor blood sugar levels

## 2023-03-23 NOTE — Assessment & Plan Note (Signed)
 Monitor labs today. Improvement in weight, diet, and exercise through medication and lifestyle changes. Will make changes as necessary.

## 2023-03-23 NOTE — Assessment & Plan Note (Addendum)
 Significant improvement with weight loss and Zepbound. Discussed potential for reversal with continued weight management and benefits in reducing cirrhosis risk. - Continue weight management and Zepbound - Monitor liver function tests

## 2023-04-25 ENCOUNTER — Telehealth: Payer: BC Managed Care – PPO | Admitting: Medical

## 2023-04-25 ENCOUNTER — Ambulatory Visit
Admission: RE | Admit: 2023-04-25 | Discharge: 2023-04-25 | Disposition: A | Discharge status: Home / Self Care | Payer: BC Managed Care – PPO | Source: Ambulatory Visit | Attending: Medical | Admitting: Medical

## 2023-04-25 VITALS — Wt 134.0 lb

## 2023-04-25 DIAGNOSIS — Z7289 Other problems related to lifestyle: Secondary | ICD-10-CM

## 2023-04-25 DIAGNOSIS — R0789 Other chest pain: Secondary | ICD-10-CM

## 2023-04-25 DIAGNOSIS — R051 Acute cough: Secondary | ICD-10-CM

## 2023-04-25 DIAGNOSIS — Z87891 Personal history of nicotine dependence: Secondary | ICD-10-CM | POA: Diagnosis not present

## 2023-04-25 DIAGNOSIS — J3489 Other specified disorders of nose and nasal sinuses: Secondary | ICD-10-CM | POA: Diagnosis not present

## 2023-04-25 NOTE — Progress Notes (Signed)
Results sent through MyChart

## 2023-04-25 NOTE — Patient Instructions (Signed)
Please go to [facility] for your chest xray.   Their hours are 8am - 4:30 pm Monday - Friday.  Take your insurance card with you.  [facility] [phone]   [address] [place], [place] [address]

## 2023-04-25 NOTE — Progress Notes (Signed)
Subjective:     Patient ID: Jaclyn Tucker, female   DOB: [birth year], 48 y.o.   MRN: [id number]  This visit type was conducted due to national recommendations for restrictions regarding the COVID-19 Pandemic (e.g. social distancing) in an effort to limit this patient's exposure and mitigate transmission in our community.  Due to their co-morbid illnesses, this patient is at least at moderate risk for complications without adequate follow up.  This format is felt to be most appropriate for this patient at this time.    Documentation for virtual audio and video telecommunications through [place] encounter:  The patient was located at home. The provider was located in the office. The patient did consent to this visit and is aware of possible charges through their insurance for this visit.  The other persons participating in this telemedicine service were none. Time spent on call was 20 minutes and in review of previous records 20 minutes total.  This virtual service is not related to other E/M service within previous 7 days.   HPI Chief Complaint  Patient presents with   sick    Congestion, headache, sore throat- with drainage,  hoarseness, chest tightness, no cough. Symptoms started 3-4 days ago   Virtual for illness.  Started 3-4 days ago.  Has been having some post nasal drainage, sinus pressure, headache, sore throat, chest and throat feels tight.   No ear pain.  Occasional cough with drainage in back of throat. No fever, no body aches, no chills.    No sick contacts.    Using some sinus medication OTC but no relief.    No hx/o asthma, but sons have asthma.  Does vape some but no smoking.  Former smoker, quit 2010.  Use to smoke 1ppd x 20 years.  Did covid test 2 days ago and it was negative.  No other aggravating or relieving factors. No other complaint.   Past Medical History:  Diagnosis Date   ANEMIA, VITAMIN B12 DEFICIENCY NEC 09/06/2006   Qualifier: Diagnosis of   By: Floydene Flock     Anxiety    Depression    Fatty liver 02/26/2019   Fibromyalgia 06/2014   GERD (gastroesophageal reflux disease)    IBS (irritable bowel syndrome) 11/04/2014   Migraine    Obese    Vitamin B12 deficiency    Current Outpatient Medications on File Prior to Visit  Medication Sig Dispense Refill   ALPRAZolam (XANAX) 0.5 MG tablet Take 0.5 mg by mouth daily as needed.      Cholecalciferol (VITAMIN D3) 1.25 MG (50000 UT) TABS Take 1 tablet by mouth once a week. 12 tablet 3   cyanocobalamin (VITAMIN B12) 1000 MCG/ML injection Inject 1 mL (1,000 mcg total) into the muscle every 30 (thirty) days. 3 mL 4   EPINEPHrine 0.3 mg/0.3 mL IJ SOAJ injection Inject 0.3 mg into the muscle as needed for anaphylaxis. As needed. 2 each 1   ibuprofen (ADVIL) 800 MG tablet Take 1 tablet (800 mg total) by mouth every 8 (eight) hours as needed for up to 21 doses for fever, headache, mild pain or moderate pain. 21 tablet 0   sertraline (ZOLOFT) 50 MG tablet Take 75 mg by mouth daily.      tirzepatide (ZEPBOUND) 5 MG/0.5ML Pen Inject 5 mg into the skin once a week. 6 mL 1   valACYclovir (VALTREX) 1000 MG tablet As needed.     ondansetron (ZOFRAN-ODT) 4 MG disintegrating tablet Take 1 tablet (4 mg total) by mouth  every 8 (eight) hours as needed for nausea or vomiting. (Patient not taking: Reported on 04/25/2023) 30 tablet 3   Syringe/Needle, Disp, (SYRINGE 3CC/20GX1-1/2") 20G X 1-1/2" 3 ML MISC Use to inject b12 12 each 6   No current facility-administered medications on file prior to visit.     Review of Systems As in subjective    Objective:   Physical Exam Due to coronavirus pandemic stay at home measures, patient visit was virtual and they were not examined in person.   General: Well-developed well-nourished no distress No labored breathing or wheezing Not ill-appearing     Assessment:     Encounter Diagnoses  Name Primary?   Chest tightness Yes   Acute cough    Sinus pressure     Former smoker    Engages in vaping        Plan:     We discussed the remaining turns.  She is really concerned about her prior tobacco use and chest tightness.  I will send her for a chest x-ray.  She has no major cardiac symptoms.  Advised if any worsening symptoms such as palpitations, chest pain or other then come in for EKG and other evaluation or get checked out emergency room in severe  Currently I suspect her symptoms represent early signs of a respiratory tract infection.  Advise she use a multi symptom remedy over-the-counter to help with cough and congestion and mucus.  Advised nasal saline and salt water gargles, warm fluids.  Hydrate well, rest.  If worsening sinus symptoms over the next 3 to 5 days can call back for recheck.  She voiced understanding of plan and agreement  Jaclyn Tucker was seen today for sick.  Diagnoses and all orders for this visit:  Chest tightness -     DG Chest 2 View; Future  Acute cough -     DG Chest 2 View; Future  Sinus pressure -     DG Chest 2 View; Future  Former smoker  Engages in Educational psychologist cxr

## 2023-05-30 DIAGNOSIS — F410 Panic disorder [episodic paroxysmal anxiety] without agoraphobia: Secondary | ICD-10-CM | POA: Diagnosis not present

## 2023-05-30 DIAGNOSIS — F411 Generalized anxiety disorder: Secondary | ICD-10-CM | POA: Diagnosis not present

## 2023-06-07 ENCOUNTER — Encounter: Payer: Self-pay | Admitting: Nurse Practitioner

## 2023-06-08 ENCOUNTER — Other Ambulatory Visit: Payer: Self-pay

## 2023-06-11 ENCOUNTER — Other Ambulatory Visit: Payer: Self-pay | Admitting: Nurse Practitioner

## 2023-06-11 DIAGNOSIS — E782 Mixed hyperlipidemia: Secondary | ICD-10-CM

## 2023-06-11 DIAGNOSIS — E88819 Insulin resistance, unspecified: Secondary | ICD-10-CM

## 2023-06-11 DIAGNOSIS — R635 Abnormal weight gain: Secondary | ICD-10-CM

## 2023-06-11 DIAGNOSIS — K760 Fatty (change of) liver, not elsewhere classified: Secondary | ICD-10-CM

## 2023-06-11 DIAGNOSIS — R030 Elevated blood-pressure reading, without diagnosis of hypertension: Secondary | ICD-10-CM

## 2023-06-11 DIAGNOSIS — Z6834 Body mass index (BMI) 34.0-34.9, adult: Secondary | ICD-10-CM

## 2023-06-11 MED ORDER — ZEPBOUND 7.5 MG/0.5ML ~~LOC~~ SOAJ: 7.5000 mg | SUBCUTANEOUS | 3 refills | Status: DC

## 2023-08-05 ENCOUNTER — Other Ambulatory Visit: Payer: Self-pay | Admitting: Nurse Practitioner

## 2023-08-05 DIAGNOSIS — D531 Other megaloblastic anemias, not elsewhere classified: Secondary | ICD-10-CM

## 2023-09-26 NOTE — Progress Notes (Signed)
 Last PAP: last year. Requested HPV?   Catheline Doing, DNP, AGNP-c [facility] [address] [place], [place] [address] Main Office [phone]  BP 116/80   Pulse 75   Ht 5' 4.5 (1.638 m)   Wt 132 lb (59.9 kg)   LMP 09/26/2023   BMI 22.31 kg/m    Subjective:    Patient ID: Jaclyn Tucker, female    DOB: 1976/03/06, 48 y.o.   MRN: [id number]  HPI: History of Present Illness Jaclyn Tucker is a 48 year old female who presents for a follow-up visit regarding her weight management and medication refills.  She is currently taking Zepbound  7.5 mg for weight management, which has helped stabilize her weight between 128 and 132 pounds. She is content with her current weight but desires to reach 125 pounds. Her BMI is 22. She has noticed improvements in her blood pressure and no longer experiences acid reflux. There is a significant reduction in fibromyalgia symptoms and back pain. She has a history of degenerative disc disease. She feels her dose is appropriate and she is tolerating it well. Right now she would like to remain on the 7.5mg .   She is also taking Zoloft  daily and has no concerns with her mood right now. She has started seeing a therapist with [facility], which is covered by her insurance, and she feels this has been very helpful for her mood and mental health. She does not need refills on sertraline  or xanax, which she only takes very sporadically.    She remains on monthly B12 and has refills left. We will check labs today.   No hearing or vision changes, throat fullness, or difficulty swallowing. She denies palpitations, swelling in her feet or ankles, and abdominal tenderness. No shortness of breath, chest pain, or dizziness. She does not receive the flu vaccine due to past adverse reactions, including prolonged illness after administration.  She engages in regular physical activity by walking her ten-year-old [employer] account planner. Her dog has been  showing signs of depression, possibly due to age-related cognitive decline, and she tries to keep her active by walking her in the evenings.    Pertinent items are noted in HPI.   Most Recent Depression Screen:     10/01/2023    8:11 AM 03/19/2023   10:40 AM 08/09/2021    4:28 PM 04/15/2021   11:53 AM 03/19/2017    5:59 PM  Depression screen PHQ 2/9  Decreased Interest 0 0 1 0 0  Down, Depressed, Hopeless 0 0 1 0 0  PHQ - 2 Score 0 0 2 0 0  Altered sleeping   0  0  Tired, decreased energy   1  1  Change in appetite   0  0  Feeling bad or failure about yourself    0  0  Trouble concentrating   1  1  Moving slowly or fidgety/restless   0  0  Suicidal thoughts   0  0  PHQ-9 Score   4  2  Difficult doing work/chores   Not difficult at all  Not difficult at all   Most Recent Anxiety Screen:      No data to display         Most Recent Fall Screen:    10/01/2023    8:11 AM 03/19/2023   10:40 AM 06/13/2022    2:41 PM 05/17/2021    9:02 AM 06/26/2017    2:01 PM  Fall Risk   Falls  in the past year? 0 0 0 0 No   Number falls in past yr: 0 0 0 0   Injury with Fall? 0 0 0 0   Risk for fall due to : No Fall Risks No Fall Risks No Fall Risks No Fall Risks   Follow up Falls evaluation completed Falls evaluation completed Falls evaluation completed Falls evaluation completed       Data saved with a previous flowsheet row definition    Past medical history, surgical history, medications, allergies, family history and social history reviewed with patient today and changes made to appropriate areas of the chart.  Past Medical History:  Past Medical History:  Diagnosis Date   Allergy    ANEMIA, VITAMIN B12 DEFICIENCY NEC 09/06/2006   Qualifier: Diagnosis of  By: Josetta Corrigan     Anxiety    Depression    Fatty liver 02/26/2019   Fibromyalgia 06/2014   GERD (gastroesophageal reflux disease)    IBS (irritable bowel syndrome) 11/04/2014   Migraine    Obese    Vitamin B12 deficiency     Medications:  Current Outpatient Medications on File Prior to Visit  Medication Sig   ALPRAZolam (XANAX) 0.5 MG tablet Take 0.5 mg by mouth daily as needed.    Cholecalciferol (VITAMIN D3) 1.25 MG (50000 UT) TABS Take 1 tablet by mouth once a week.   cyanocobalamin  (VITAMIN B12) 1000 MCG/ML injection ADMINISTER 1 ML(1000 MCG) IN THE MUSCLE EVERY 30 DAYS   EPINEPHrine  0.3 mg/0.3 mL IJ SOAJ injection Inject 0.3 mg into the muscle as needed for anaphylaxis. As needed.   ibuprofen  (ADVIL ) 800 MG tablet Take 1 tablet (800 mg total) by mouth every 8 (eight) hours as needed for up to 21 doses for fever, headache, mild pain or moderate pain.   ondansetron  (ZOFRAN -ODT) 4 MG disintegrating tablet Take 1 tablet (4 mg total) by mouth every 8 (eight) hours as needed for nausea or vomiting.   sertraline  (ZOLOFT ) 50 MG tablet Take 75 mg by mouth daily.    Syringe/Needle, Disp, (SYRINGE 3CC/20GX1-1/2) 20G X 1-1/2 3 ML MISC Use to inject b12   valACYclovir (VALTREX) 1000 MG tablet As needed.   No current facility-administered medications on file prior to visit.   Surgical History:  Past Surgical History:  Procedure Laterality Date   g2 p2     Allergies:  Allergies  Allergen Reactions   Bee Venom Swelling   Cyclobenzaprine  Swelling   Moxifloxacin Anaphylaxis, Palpitations and Other (See Comments)    REACTION: nausea REACTION: nausea REACTION: nausea   Penicillins Other (See Comments)    REACTION: swelling  Parents told her she couldn't take it   Quinolones Anaphylaxis and Palpitations    REACTION: nausea   Shellfish Allergy Rash   Sulfate Rash   Sumatriptan  Succinate     Other reaction(s): Dizziness, Other REACTION: nausea, dizziness, panic symptoms REACTION: nausea, dizziness, panic symptoms   Celexa  [Citalopram  Hydrobromide] Other (See Comments)    Panic attacks   Duloxetine  Other (See Comments)    Panic attacks   Escitalopram Other (See Comments)    Panic attacks   Ranitidine  Hcl     Other reaction(s): Other (See Comments) uknown   Family History:  Family History  Problem Relation Age of Onset   Diabetes Mother    Hypertension Mother    Cerebrovascular Accident Mother    Hypertension Father    Crohn's disease Father    Colon polyps Father    Anxiety disorder Father  Depression Father    Obesity Father    Colon cancer Paternal Grandmother    Breast cancer Paternal Grandmother    Anxiety disorder Paternal Grandmother    Cancer Paternal Grandmother    Hypertension Paternal Grandmother    Crohn's disease Brother    Anxiety disorder Sister    Lung disease Paternal Grandfather    Alzheimer's disease Other    ADD / ADHD Son    Asthma Son    Learning disabilities Son    ADD / ADHD Son    Learning disabilities Son    Varicose Veins Paternal Aunt    Esophageal cancer Neg Hx    Rectal cancer Neg Hx    Stomach cancer Neg Hx    Prostate cancer Neg Hx    Pancreatic cancer Neg Hx    Ovarian cancer Neg Hx        Objective:    BP 116/80   Pulse 75   Ht 5' 4.5 (1.638 m)   Wt 132 lb (59.9 kg)   LMP 09/26/2023   BMI 22.31 kg/m   Wt Readings from Last 3 Encounters:  10/01/23 132 lb (59.9 kg)  04/25/23 134 lb (60.8 kg)  03/19/23 139 lb 9.6 oz (63.3 kg)    Physical Exam Vitals and nursing note reviewed.  Constitutional:      General: She is not in acute distress.    Appearance: Normal appearance. She is normal weight.  HENT:     Head: Normocephalic and atraumatic.     Right Ear: Hearing, tympanic membrane, ear canal and external ear normal.     Left Ear: Hearing, tympanic membrane, ear canal and external ear normal.     Nose: Nose normal.     Right Sinus: No maxillary sinus tenderness or frontal sinus tenderness.     Left Sinus: No maxillary sinus tenderness or frontal sinus tenderness.     Mouth/Throat:     Lips: Pink.     Mouth: Mucous membranes are moist.     Pharynx: Oropharynx is clear.  Eyes:     General: Lids are normal. Vision  grossly intact.     Extraocular Movements: Extraocular movements intact.     Conjunctiva/sclera: Conjunctivae normal.     Pupils: Pupils are equal, round, and reactive to light.     Funduscopic exam:    Right eye: Red reflex present.        Left eye: Red reflex present.    Visual Fields: Right eye visual fields normal and left eye visual fields normal.  Neck:     Thyroid : No thyromegaly.     Vascular: No carotid bruit.  Cardiovascular:     Rate and Rhythm: Normal rate and regular rhythm.     Chest Wall: PMI is not displaced.     Pulses: Normal pulses.          Dorsalis pedis pulses are 2+ on the right side and 2+ on the left side.       Posterior tibial pulses are 2+ on the right side and 2+ on the left side.     Heart sounds: Normal heart sounds. No murmur heard. Pulmonary:     Effort: Pulmonary effort is normal. No respiratory distress.     Breath sounds: Normal breath sounds.  Abdominal:     General: Abdomen is flat. Bowel sounds are normal. There is no distension.     Palpations: Abdomen is soft. There is no hepatomegaly, splenomegaly or mass.     Tenderness:  There is no abdominal tenderness. There is no right CVA tenderness, left CVA tenderness, guarding or rebound.  Musculoskeletal:        General: Normal range of motion.     Cervical back: Full passive range of motion without pain, normal range of motion and neck supple. No tenderness.     Right lower leg: No edema.     Left lower leg: No edema.  Feet:     Left foot:     Toenail Condition: Left toenails are normal.  Lymphadenopathy:     Cervical: No cervical adenopathy.     Upper Body:     Right upper body: No supraclavicular adenopathy.     Left upper body: No supraclavicular adenopathy.  Skin:    General: Skin is warm and dry.     Capillary Refill: Capillary refill takes less than 2 seconds.     Nails: There is no clubbing.  Neurological:     General: No focal deficit present.     Mental Status: She is alert and  oriented to person, place, and time.     GCS: GCS eye subscore is 4. GCS verbal subscore is 5. GCS motor subscore is 6.     Sensory: Sensation is intact. No sensory deficit.     Motor: Motor function is intact. No weakness.     Coordination: Coordination is intact. Coordination normal.     Gait: Gait is intact.     Deep Tendon Reflexes: Reflexes are normal and symmetric.  Psychiatric:        Attention and Perception: Attention normal.        Mood and Affect: Mood normal.        Speech: Speech normal.        Behavior: Behavior normal. Behavior is cooperative.        Thought Content: Thought content normal.        Cognition and Memory: Cognition and memory normal.        Judgment: Judgment normal.      Results for orders placed or performed in visit on 03/19/23  Hemoglobin A1c   Collection Time: 03/19/23 11:59 AM  Result Value Ref Range   Hgb A1c MFr Bld 5.2 4.8 - 5.6 %   Est. average glucose Bld gHb Est-mCnc 103 mg/dL  CBC with Differential/Platelet   Collection Time: 03/19/23 11:59 AM  Result Value Ref Range   WBC 5.9 3.4 - 10.8 x10E3/uL   RBC 4.93 3.77 - 5.28 x10E6/uL   Hemoglobin 14.3 11.1 - 15.9 g/dL   Hematocrit 56.4 65.9 - 46.6 %   MCV 88 79 - 97 fL   MCH 29.0 26.6 - 33.0 pg   MCHC 32.9 31.5 - 35.7 g/dL   RDW 87.3 88.2 - 84.5 %   Platelets 336 150 - 450 x10E3/uL   Neutrophils 58 Not Estab. %   Lymphs 35 Not Estab. %   Monocytes 5 Not Estab. %   Eos 1 Not Estab. %   Basos 1 Not Estab. %   Neutrophils Absolute 3.4 1.4 - 7.0 x10E3/uL   Lymphocytes Absolute 2.1 0.7 - 3.1 x10E3/uL   Monocytes Absolute 0.3 0.1 - 0.9 x10E3/uL   EOS (ABSOLUTE) 0.1 0.0 - 0.4 x10E3/uL   Basophils Absolute 0.0 0.0 - 0.2 x10E3/uL   Immature Granulocytes 0 Not Estab. %   Immature Grans (Abs) 0.0 0.0 - 0.1 x10E3/uL  Comprehensive metabolic panel   Collection Time: 03/19/23 11:59 AM  Result Value Ref Range   Glucose 78  70 - 99 mg/dL   BUN 8 6 - 24 mg/dL   Creatinine, Ser 9.11 0.57 - 1.00  mg/dL   eGFR 83 >40 fO/fpw/8.26   BUN/Creatinine Ratio 9 9 - 23   Sodium 139 134 - 144 mmol/L   Potassium 4.7 3.5 - 5.2 mmol/L   Chloride 105 96 - 106 mmol/L   CO2 22 20 - 29 mmol/L   Calcium 9.2 8.7 - 10.2 mg/dL   Total Protein 7.3 6.0 - 8.5 g/dL   Albumin 4.3 3.9 - 4.9 g/dL   Globulin, Total 3.0 1.5 - 4.5 g/dL   Bilirubin Total 0.4 0.0 - 1.2 mg/dL   Alkaline Phosphatase 70 44 - 121 IU/L   AST 12 0 - 40 IU/L   ALT 9 0 - 32 IU/L  Lipid panel   Collection Time: 03/19/23 11:59 AM  Result Value Ref Range   Cholesterol, Total 193 100 - 199 mg/dL   Triglycerides 76 0 - 149 mg/dL   HDL 45 >60 mg/dL   VLDL Cholesterol Cal 14 5 - 40 mg/dL   LDL Chol Calc (NIH) 865 (H) 0 - 99 mg/dL   Chol/HDL Ratio 4.3 0.0 - 4.4 ratio  Vitamin B12   Collection Time: 03/19/23 11:59 AM  Result Value Ref Range   Vitamin B-12 652 232 - 1,245 pg/mL  VITAMIN D  25 Hydroxy (Vit-D Deficiency, Fractures)   Collection Time: 03/19/23 11:59 AM  Result Value Ref Range   Vit D, 25-Hydroxy 54.5 30.0 - 100.0 ng/mL       Assessment & Plan:   Problem List Items Addressed This Visit     Generalized anxiety disorder   Recently experienced three panic attacks within three days. Started therapy to manage stress related to personal and financial issues. Therapy is covered by insurance, facilitating access to mental health support. - Continue therapy sessions through [employer], covered by insurance.      Fibromyalgia   Reports improvement in fibromyalgia symptoms, attributing it to weight loss and the anti-inflammatory properties of Zepbound .       Gastroesophageal reflux disease   Reports significant improvement in GERD symptoms, attributed to weight loss and Zepbound .      Encounter for annual physical exam - Primary   CPE completed today. Review of HM activities and recommendations discussed and provided on AVS. Anticipatory guidance, diet, and exercise recommendations provided. Medications, allergies, and hx  reviewed and updated as necessary. Orders placed as listed below.  Plan: - Labs ordered. Will make changes as necessary based on results.  - I will review these results and send recommendations via MyChart or a telephone call.  - F/U with CPE in 1 year or sooner for acute/chronic health needs as directed.        Fatty liver   Weight management with successful weight loss of 64lbs since starting Zepbound . Will monitor liver labs today for management. No concerns at this time. Exam benign.       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 Repeat labs today for monitoring. No changes at this time.       Elevated blood pressure reading in office without diagnosis of hypertension   Blood pressure normalized with weight management. No concerns at this time. Not on any medications. Will continue with exercise, diet, and medication for weight management.       Relevant  Medications   tirzepatide  (ZEPBOUND ) 7.5 MG/0.5ML Pen   Other Relevant Orders   CMP14+EGFR   Hemoglobin A1c   Insulin resistance   Continue with Zepbound  7.5mg . No changes at this time. Labs pending.       Relevant Medications   tirzepatide  (ZEPBOUND ) 7.5 MG/0.5ML Pen   Other Relevant Orders   CBC with Differential/Platelet   CMP14+EGFR   Hemoglobin A1c   BMI 34.0-34.9,adult   She has successfully reduced her BMI to 22 with Zepbound , maintaining her weight between 128 and 132 pounds. Reports significant improvement in overall health, including reduced hypertension, decreased fibromyalgia symptoms, and improved gastroesophageal reflux disease (GERD). Satisfied with her current weight and health status but expresses a desire to reach 125 pounds. Further weight loss is not necessary for health reasons. Zepbound  has been beneficial for its anti-inflammatory properties, contributing to  improvements in fibromyalgia and GERD. Insurance coverage for Zepbound  is crucial, as it has significantly improved her quality of life beyond weight management. - Refill Zepbound  prescription at 7.5 mg dose.      Relevant Medications   tirzepatide  (ZEPBOUND ) 7.5 MG/0.5ML Pen   Other Relevant Orders   CBC with Differential/Platelet   CMP14+EGFR   Hemoglobin A1c   Mixed hyperlipidemia   Repeat labs today. Not currently taking any medication. Weight management with Zepbound .       Relevant Medications   tirzepatide  (ZEPBOUND ) 7.5 MG/0.5ML Pen   Other Relevant Orders   Lipid panel   Vitamin D  deficiency   Repeat labs today for monitoring.       Relevant Orders   VITAMIN D  25 Hydroxy (Vit-D Deficiency, Fractures)   Other Visit Diagnoses       Weight gain       Relevant Medications   tirzepatide  (ZEPBOUND ) 7.5 MG/0.5ML Pen     B12 deficiency       Relevant Orders   Vitamin B12         Follow up plan: Return in about 6 months (around 04/02/2024) for [id number]_Virtual OK.  NEXT PREVENTATIVE PHYSICAL DUE IN 1 YEAR.  PATIENT COUNSELING PROVIDED FOR ALL ADULT PATIENTS: A well balanced diet low in saturated fats, cholesterol, and moderation in carbohydrates.  This can be as simple as monitoring portion sizes and cutting back on sugary beverages such as soda and juice to start with.    Daily water consumption of at least 64 ounces.  Physical activity at least 180 minutes per week.  If just starting out, start 10 minutes a day and work your way up.   This can be as simple as taking the stairs instead of the elevator and walking 2-3 laps around the office  purposefully every day.   STD protection, partner selection, and regular testing if high risk.  Limited consumption of alcoholic beverages if alcohol is consumed. For men, I recommend no more than 14 alcoholic beverages per week, spread out throughout the week (max 2 per day). Avoid binge drinking or consuming large  quantities of alcohol in one setting.  Please let me know if you feel you may need help with reduction or quitting alcohol consumption.   Avoidance of nicotine, if used. Please let me know if you feel you may need help with reduction or quitting nicotine use.   Daily mental health attention. This can be in the form of 5 minute daily meditation, prayer, journaling, yoga, reflection, etc.  Purposeful attention to your emotions and mental state can significantly improve your overall wellbeing  and  Health.  Please know that I am here to help you with all of your health care goals and am happy to work with you to find a solution that works best for you.  The greatest advice I have received with any changes in life are to take it one step at a time, that even means if all you can focus on is the next 60 seconds, then do that and celebrate your victories.  With any changes in life, you will have set backs, and that is OK. The important thing to remember is, if you have a set back, it is not a failure, it is an opportunity to try again! Screening Testing Mammogram Every 1 -2 years based on history and risk factors Starting at age 50 Pap Smear Ages 21-39 every 3 years Ages 76-65 every 5 years with HPV testing More frequent testing may be required based on results and history Colon Cancer Screening Every 1-10 years based on test performed, risk factors, and history Starting at age 74 Bone Density Screening Every 2-10 years based on history Starting at age 70 for women Recommendations for men differ based on medication usage, history, and risk factors AAA Screening One time ultrasound Men 30-2 years old who have every smoked Lung Cancer Screening Low Dose Lung CT every 12 months Age 72-80 years with a 30 pack-year smoking history who still smoke or who have quit within the last 15 years   Screening Labs Routine  Labs: Complete Blood Count (CBC), Complete Metabolic Panel (CMP), Cholesterol  (Lipid Panel) Every 6-12 months based on history and medications May be recommended more frequently based on current conditions or previous results Hemoglobin A1c Lab Every 3-12 months based on history and previous results Starting at age 65 or earlier with diagnosis of diabetes, high cholesterol, BMI >26, and/or risk factors Frequent monitoring for patients with diabetes to ensure blood sugar control Thyroid  Panel (TSH) Every 6 months based on history, symptoms, and risk factors May be repeated more often if on medication HIV One time testing for all patients 29 and older May be repeated more frequently for patients with increased risk factors or exposure Hepatitis C One time testing for all patients 22 and older May be repeated more frequently for patients with increased risk factors or exposure Gonorrhea, Chlamydia Every 12 months for all sexually active persons 13-24 years Additional monitoring may be recommended for those who are considered high risk or who have symptoms Every 12 months for any woman on birth control, regardless of sexual activity PSA Men 47-41 years old with risk factors Additional screening may be recommended from age 65-69 based on risk factors, symptoms, and history  Vaccine Recommendations Tetanus Booster All adults every 10 years Flu Vaccine All patients 6 months and older every year COVID Vaccine All patients 12 years and older Initial dosing with booster May recommend additional booster based on age and health history HPV Vaccine 2 doses all patients age 45-26 Dosing may be considered for patients over 26 Shingles Vaccine (Shingrix) 2 doses all adults 55 years and older Pneumonia (Pneumovax 50) All adults 65 years and older May recommend earlier dosing based on health history One year apart from Prevnar 65 Pneumonia (Prevnar 59) All adults 65 years and older Dosed 1 year after Pneumovax 23 Pneumonia (Prevnar 20) One time alternative to the  two dosing of 13 and 23 For all adults with initial dose of 23, 20 is recommended 1 year later For all adults with initial dose of 13,  23 is still recommended as second option 1 year later

## 2023-10-01 ENCOUNTER — Encounter: Payer: Self-pay | Admitting: Nurse Practitioner

## 2023-10-01 ENCOUNTER — Ambulatory Visit: Payer: BC Managed Care – PPO | Admitting: Nurse Practitioner

## 2023-10-01 ENCOUNTER — Encounter: Payer: Self-pay | Admitting: Obstetrics and Gynecology

## 2023-10-01 VITALS — BP 116/80 | HR 75 | Ht 64.5 in | Wt 132.0 lb

## 2023-10-01 DIAGNOSIS — Z0000 Encounter for general adult medical examination without abnormal findings: Secondary | ICD-10-CM

## 2023-10-01 DIAGNOSIS — R030 Elevated blood-pressure reading, without diagnosis of hypertension: Secondary | ICD-10-CM

## 2023-10-01 DIAGNOSIS — Z6834 Body mass index (BMI) 34.0-34.9, adult: Secondary | ICD-10-CM

## 2023-10-01 DIAGNOSIS — E559 Vitamin D deficiency, unspecified: Secondary | ICD-10-CM | POA: Diagnosis not present

## 2023-10-01 DIAGNOSIS — E538 Deficiency of other specified B group vitamins: Secondary | ICD-10-CM | POA: Diagnosis not present

## 2023-10-01 DIAGNOSIS — F411 Generalized anxiety disorder: Secondary | ICD-10-CM

## 2023-10-01 DIAGNOSIS — E782 Mixed hyperlipidemia: Secondary | ICD-10-CM | POA: Diagnosis not present

## 2023-10-01 DIAGNOSIS — K760 Fatty (change of) liver, not elsewhere classified: Secondary | ICD-10-CM | POA: Diagnosis not present

## 2023-10-01 DIAGNOSIS — R635 Abnormal weight gain: Secondary | ICD-10-CM

## 2023-10-01 DIAGNOSIS — K219 Gastro-esophageal reflux disease without esophagitis: Secondary | ICD-10-CM

## 2023-10-01 DIAGNOSIS — E88819 Insulin resistance, unspecified: Secondary | ICD-10-CM

## 2023-10-01 DIAGNOSIS — M797 Fibromyalgia: Secondary | ICD-10-CM

## 2023-10-01 DIAGNOSIS — D531 Other megaloblastic anemias, not elsewhere classified: Secondary | ICD-10-CM

## 2023-10-01 MED ORDER — ZEPBOUND 7.5 MG/0.5ML ~~LOC~~ SOAJ: 7.5000 mg | SUBCUTANEOUS | 1 refills | Status: DC

## 2023-10-01 NOTE — Assessment & Plan Note (Signed)
 Continue with Zepbound  7.5mg . No changes at this time. Labs pending.

## 2023-10-01 NOTE — Assessment & Plan Note (Signed)

## 2023-10-01 NOTE — Assessment & Plan Note (Signed)
 She has successfully reduced her BMI to 22 with Zepbound , maintaining her weight between 128 and 132 pounds. Reports significant improvement in overall health, including reduced hypertension, decreased fibromyalgia symptoms, and improved gastroesophageal reflux disease (GERD). Satisfied with her current weight and health status but expresses a desire to reach 125 pounds. Further weight loss is not necessary for health reasons. Zepbound  has been beneficial for its anti-inflammatory properties, contributing to improvements in fibromyalgia and GERD. Insurance coverage for Zepbound  is crucial, as it has significantly improved her quality of life beyond weight management. - Refill Zepbound  prescription at 7.5 mg dose.

## 2023-10-01 NOTE — Assessment & Plan Note (Signed)
 Reports improvement in fibromyalgia symptoms, attributing it to weight loss and the anti-inflammatory properties of Zepbound .

## 2023-10-01 NOTE — Patient Instructions (Addendum)
 You look fabulous today!! I am so proud of you and how well you have done! Keep up the great work.   We will get the results from your pap smear to update your record.   If we determine the Hepatitis B vaccine is needed, I will let you know.   I have sent in refills on the Zepbound . Let me know if you decide you want to go up on that.  At this time your BMI is 22.31, which is PERFECT!!    For all adult patients, I recommend A well balanced diet low in saturated fats, cholesterol, and moderation in carbohydrates.  This can be as simple as monitoring portion sizes and cutting back on sugary beverages such as soda and juice to start with.    Daily water consumption of at least 64 ounces.  Physical activity at least 180 minutes per week, if just starting out.   This can be as simple as taking the stairs instead of the elevator and walking 2-3 laps around the office  purposefully every day.   STD protection, partner selection, and regular testing if high risk.  Limited consumption of alcoholic beverages if alcohol is consumed.  For women, I recommend no more than 7 alcoholic beverages per week, spread out throughout the week.  Avoid binge drinking or consuming large quantities of alcohol in one setting.   Please let me know if you feel you may need help with reduction or quitting alcohol consumption.   Avoidance of nicotine, if used.  Please let me know if you feel you may need help with reduction or quitting nicotine use.   Daily mental health attention.  This can be in the form of 5 minute daily meditation, prayer, journaling, yoga, reflection, etc.   Purposeful attention to your emotions and mental state can significantly improve your overall wellbeing   And Health.  Please know that I am here to help you with all of your health care goals and am happy to work with you to find a solution that works best for you.  The greatest advice I have received with any changes in life are to  take it one step at a time, that even means if all you can focus on is the next 60 seconds, then do that and celebrate your victories.  With any changes in life, you will have set backs, and that is OK. The important thing to remember is, if you have a set back, it is not a failure, it is an opportunity to try again!  Health Maintenance Recommendations Screening Testing Mammogram Every 1 -2 years based on history and risk factors Starting at age 41 Pap Smear Ages 21-39 every 3 years Ages 81-65 every 5 years with HPV testing More frequent testing may be required based on results and history Colon Cancer Screening Every 1-10 years based on test performed, risk factors, and history Starting at age 63 Bone Density Screening Every 2-10 years based on history Starting at age 75 for women Recommendations for men differ based on medication usage, history, and risk factors AAA Screening One time ultrasound Men 61-20 years old who have every smoked Lung Cancer Screening Low Dose Lung CT every 12 months Age 43-80 years with a 30 pack-year smoking history who still smoke or who have quit within the last 15 years  Screening Labs Routine  Labs: Complete Blood Count (CBC), Complete Metabolic Panel (CMP), Cholesterol (Lipid Panel) Every 6-12 months based on history and medications May be  recommended more frequently based on current conditions or previous results Hemoglobin A1c Lab Every 3-12 months based on history and previous results Starting at age 39 or earlier with diagnosis of diabetes, high cholesterol, BMI >26, and/or risk factors Frequent monitoring for patients with diabetes to ensure blood sugar control Thyroid  Panel (TSH w/ T3 & T4) Every 6 months based on history, symptoms, and risk factors May be repeated more often if on medication HIV One time testing for all patients 65 and older May be repeated more frequently for patients with increased risk factors or exposure Hepatitis  C One time testing for all patients 83 and older May be repeated more frequently for patients with increased risk factors or exposure Gonorrhea, Chlamydia Every 12 months for all sexually active persons 13-24 years Additional monitoring may be recommended for those who are considered high risk or who have symptoms PSA Men 50-88 years old with risk factors Additional screening may be recommended from age 22-69 based on risk factors, symptoms, and history  Vaccine Recommendations Tetanus Booster All adults every 10 years Flu Vaccine All patients 6 months and older every year COVID Vaccine All patients 12 years and older Initial dosing with booster May recommend additional booster based on age and health history HPV Vaccine 2 doses all patients age 33-26 Dosing may be considered for patients over 26 Shingles Vaccine (Shingrix) 2 doses all adults 55 years and older Pneumonia (Pneumovax 23) All adults 65 years and older May recommend earlier dosing based on health history Pneumonia (Prevnar 6) All adults 65 years and older Dosed 1 year after Pneumovax 23  Additional Screening, Testing, and Vaccinations may be recommended on an individualized basis based on family history, health history, risk factors, and/or exposure.

## 2023-10-01 NOTE — Assessment & Plan Note (Signed)
 Taking B12 injections. Repeat labs today for monitoring. No changes at this time.

## 2023-10-01 NOTE — Assessment & Plan Note (Signed)
 Repeat labs today. Not currently taking any medication. Weight management with Zepbound .

## 2023-10-01 NOTE — Assessment & Plan Note (Signed)
 Blood pressure normalized with weight management. No concerns at this time. Not on any medications. Will continue with exercise, diet, and medication for weight management.

## 2023-10-01 NOTE — Assessment & Plan Note (Signed)
 Recently experienced three panic attacks within three days. Started therapy to manage stress related to personal and financial issues. Therapy is covered by insurance, facilitating access to mental health support. - Continue therapy sessions through [employer], covered by insurance.

## 2023-10-01 NOTE — Assessment & Plan Note (Signed)
 Repeat labs today for monitoring.

## 2023-10-01 NOTE — Assessment & Plan Note (Signed)
 Weight management with successful weight loss of 64lbs since starting Zepbound . Will monitor liver labs today for management. No concerns at this time. Exam benign.

## 2023-10-01 NOTE — Assessment & Plan Note (Signed)
 Reports significant improvement in GERD symptoms, attributed to weight loss and Zepbound .

## 2023-10-02 LAB — LIPID PANEL
Chol/HDL Ratio: 3 ratio (ref 0.0–4.4)
Cholesterol, Total: 159 mg/dL (ref 100–199)
HDL: 53 mg/dL (ref >39)
LDL Chol Calc (NIH): 95 mg/dL (ref 0–99)
Triglycerides: 51 mg/dL (ref 0–149)
VLDL Cholesterol Cal: 11 mg/dL (ref 5–40)

## 2023-10-02 LAB — CMP14+EGFR
ALT: 8 IU/L (ref 0–32)
AST: 18 IU/L (ref 0–40)
Albumin: 4.1 g/dL (ref 3.9–4.9)
Alkaline Phosphatase: 54 IU/L (ref 44–121)
BUN/Creatinine Ratio: 11 (ref 9–23)
BUN: 10 mg/dL (ref 6–24)
Bilirubin Total: 0.3 mg/dL (ref 0.0–1.2)
CO2: 21 mmol/L (ref 20–29)
Calcium: 8.9 mg/dL (ref 8.7–10.2)
Chloride: 105 mmol/L (ref 96–106)
Creatinine, Ser: 0.89 mg/dL (ref 0.57–1.00)
Globulin, Total: 2.6 g/dL (ref 1.5–4.5)
Glucose: 80 mg/dL (ref 70–99)
Potassium: 4.6 mmol/L (ref 3.5–5.2)
Sodium: 139 mmol/L (ref 134–144)
Total Protein: 6.7 g/dL (ref 6.0–8.5)
eGFR: 81 mL/min/1.73 (ref >59)

## 2023-10-02 LAB — CBC WITH DIFFERENTIAL/PLATELET
Basophils Absolute: 0 x10E3/uL (ref 0.0–0.2)
Basos: 1 %
EOS (ABSOLUTE): 0.1 x10E3/uL (ref 0.0–0.4)
Eos: 2 %
Hematocrit: 39.7 % (ref 34.0–46.6)
Hemoglobin: 12.8 g/dL (ref 11.1–15.9)
Immature Grans (Abs): 0 x10E3/uL (ref 0.0–0.1)
Immature Granulocytes: 0 %
Lymphocytes Absolute: 1.7 x10E3/uL (ref 0.7–3.1)
Lymphs: 36 %
MCH: 29.6 pg (ref 26.6–33.0)
MCHC: 32.2 g/dL (ref 31.5–35.7)
MCV: 92 fL (ref 79–97)
Monocytes Absolute: 0.3 x10E3/uL (ref 0.1–0.9)
Monocytes: 6 %
Neutrophils Absolute: 2.5 x10E3/uL (ref 1.4–7.0)
Neutrophils: 55 %
Platelets: 314 x10E3/uL (ref 150–450)
RBC: 4.33 x10E6/uL (ref 3.77–5.28)
RDW: 13 % (ref 11.7–15.4)
WBC: 4.6 x10E3/uL (ref 3.4–10.8)

## 2023-10-02 LAB — HEMOGLOBIN A1C
Est. average glucose Bld gHb Est-mCnc: 100 mg/dL
Hgb A1c MFr Bld: 5.1 % (ref 4.8–5.6)

## 2023-10-02 LAB — VITAMIN D 25 HYDROXY (VIT D DEFICIENCY, FRACTURES): Vit D, 25-Hydroxy: 44.4 ng/mL (ref 30.0–100.0)

## 2023-10-02 LAB — VITAMIN B12: Vitamin B-12: 596 pg/mL (ref 232–1245)

## 2023-10-03 ENCOUNTER — Ambulatory Visit: Payer: Self-pay | Admitting: Nurse Practitioner

## 2023-10-09 DIAGNOSIS — L814 Other melanin hyperpigmentation: Secondary | ICD-10-CM | POA: Diagnosis not present

## 2023-10-24 DIAGNOSIS — F410 Panic disorder [episodic paroxysmal anxiety] without agoraphobia: Secondary | ICD-10-CM | POA: Diagnosis not present

## 2023-10-24 DIAGNOSIS — F411 Generalized anxiety disorder: Secondary | ICD-10-CM | POA: Diagnosis not present

## 2023-11-07 ENCOUNTER — Other Ambulatory Visit: Payer: Self-pay | Admitting: Medical Genetics

## 2023-12-06 ENCOUNTER — Encounter: Payer: Self-pay | Admitting: Nurse Practitioner

## 2023-12-07 ENCOUNTER — Other Ambulatory Visit: Payer: Self-pay | Admitting: Nurse Practitioner

## 2023-12-07 DIAGNOSIS — E782 Mixed hyperlipidemia: Secondary | ICD-10-CM

## 2023-12-07 DIAGNOSIS — K760 Fatty (change of) liver, not elsewhere classified: Secondary | ICD-10-CM

## 2023-12-07 DIAGNOSIS — Z6834 Body mass index (BMI) 34.0-34.9, adult: Secondary | ICD-10-CM

## 2023-12-07 MED ORDER — ZEPBOUND 10 MG/0.5ML ~~LOC~~ SOAJ: 10.0000 mg | SUBCUTANEOUS | 0 refills | Status: DC

## 2024-01-13 ENCOUNTER — Other Ambulatory Visit: Payer: Self-pay | Admitting: Medical Genetics

## 2024-01-13 DIAGNOSIS — Z006 Encounter for examination for normal comparison and control in clinical research program: Secondary | ICD-10-CM

## 2024-01-14 DIAGNOSIS — Z01419 Encounter for gynecological examination (general) (routine) without abnormal findings: Secondary | ICD-10-CM | POA: Diagnosis not present

## 2024-01-14 DIAGNOSIS — Z1231 Encounter for screening mammogram for malignant neoplasm of breast: Secondary | ICD-10-CM | POA: Diagnosis not present

## 2024-02-19 ENCOUNTER — Other Ambulatory Visit: Payer: Self-pay | Admitting: Nurse Practitioner

## 2024-02-19 DIAGNOSIS — K760 Fatty (change of) liver, not elsewhere classified: Secondary | ICD-10-CM

## 2024-02-19 DIAGNOSIS — Z6834 Body mass index (BMI) 34.0-34.9, adult: Secondary | ICD-10-CM

## 2024-02-19 DIAGNOSIS — E782 Mixed hyperlipidemia: Secondary | ICD-10-CM

## 2024-04-01 ENCOUNTER — Encounter: Payer: Self-pay | Admitting: Nurse Practitioner

## 2024-04-02 ENCOUNTER — Other Ambulatory Visit: Payer: Self-pay | Admitting: Nurse Practitioner

## 2024-04-02 DIAGNOSIS — R110 Nausea: Secondary | ICD-10-CM

## 2024-04-02 DIAGNOSIS — E88819 Insulin resistance, unspecified: Secondary | ICD-10-CM

## 2024-04-02 DIAGNOSIS — E782 Mixed hyperlipidemia: Secondary | ICD-10-CM

## 2024-04-02 DIAGNOSIS — Z6834 Body mass index (BMI) 34.0-34.9, adult: Secondary | ICD-10-CM

## 2024-04-02 DIAGNOSIS — K760 Fatty (change of) liver, not elsewhere classified: Secondary | ICD-10-CM

## 2024-04-02 MED ORDER — ZEPBOUND 7.5 MG/0.5ML ~~LOC~~ SOAJ: 7.5000 mg | SUBCUTANEOUS | 1 refills | Status: AC

## 2024-04-02 MED ORDER — ONDANSETRON 4 MG PO TBDP: 4.0000 mg | ORAL_TABLET | Freq: Three times a day (TID) | ORAL | 3 refills | Status: AC | PRN

## 2024-04-02 NOTE — Progress Notes (Unsigned)
{  SEHM (Optional):34217}  Catheline Doing, DNP, AGNP-c [facility]  [address] [place], [place] [address] [phone]   ESTABLISHED PATIENT- Chronic Health and/or Follow-Up Visit on 04/03/2024  There were no vitals taken for this visit.   Subjective:  No chief complaint on file.   *** ROS negative except for what is listed in HPI. History, Medications, Surgery, SDOH, and Family History reviewed and updated as appropriate.  Objective:  Physical Exam      Assessment & Plan:   Assessment & Plan    Camie FORBES Doing, DNP, AGNP-c  {SETIMEYorN (Optional):34216}

## 2024-04-03 ENCOUNTER — Encounter: Payer: Self-pay | Admitting: Nurse Practitioner

## 2024-04-03 ENCOUNTER — Telehealth: Payer: Self-pay | Admitting: Nurse Practitioner

## 2024-04-03 VITALS — BP 116/72 | Wt 124.6 lb

## 2024-04-03 DIAGNOSIS — E669 Obesity, unspecified: Secondary | ICD-10-CM

## 2024-04-03 DIAGNOSIS — E88819 Insulin resistance, unspecified: Secondary | ICD-10-CM

## 2024-04-03 DIAGNOSIS — Z6834 Body mass index (BMI) 34.0-34.9, adult: Secondary | ICD-10-CM | POA: Diagnosis not present

## 2024-04-03 MED ORDER — "EMBECTA INSULIN SYR ULTRAFINE 30G X 1/2"" 1 ML MISC": 0 refills | Status: AC

## 2024-04-03 NOTE — Patient Instructions (Addendum)
 You are doing amazing!! I am so proud of you! Keep up the great work!  Use the remainder of the 10mg  doses as split doses in the vital as we discussed. I sent in the insulin  syringes.   100u = 1mL  so 50u = 0.22mL   50u = 10mg  25u = 5mg  12.5u = 2.5mg   Please let me know when you need me to send a script to [facility] and want dose you want sent.

## 2024-04-03 NOTE — Assessment & Plan Note (Addendum)
 Obesity with insulin  resistance, currently managed with tirzepatide . She has experienced significant weight loss, now at 124.6 lbs, and wishes to maintain her current weight. Insurance no longer covers tirzepatide , but she has a supply of 10 mg doses available that she plans to complete. She has experienced nausea and vomiting with the current dose, managed with ondansetron . She is considering a dose reduction to 7.5 mg or 5 mg to maintain weight and manage side effects. She is exploring cost-saving options, including a savings card and purchasing vials online. She reports improvement in fibromyalgia and anxiety with tirzepatide  use. - Continue ondansetron  as needed for nausea, with the option to increase to 8 mg if necessary. - Use insulin  syringes to measure and administer tirzepatide  from vial   Orders:   Insulin  Syringe-Needle U-100 (EMBECTA INSULIN  SYR ULTRAFINE) 30G X 1/2 1 ML MISC; For use with tirzepatide  injections once a week.

## 2024-10-13 ENCOUNTER — Encounter: Payer: Self-pay | Admitting: Nurse Practitioner
